# Patient Record
Sex: Female | Born: 1955 | Race: White | Hispanic: No | Marital: Married | Smoking: Former smoker
Health system: Southern US, Community
[De-identification: ages and names within clinical notes are randomized; demographics above are authoritative.]

## PROBLEM LIST (undated history)

## (undated) DIAGNOSIS — T8859XA Other complications of anesthesia, initial encounter: Secondary | ICD-10-CM

## (undated) DIAGNOSIS — M25569 Pain in unspecified knee: Secondary | ICD-10-CM

## (undated) DIAGNOSIS — K219 Gastro-esophageal reflux disease without esophagitis: Secondary | ICD-10-CM

## (undated) DIAGNOSIS — D649 Anemia, unspecified: Secondary | ICD-10-CM

## (undated) DIAGNOSIS — D509 Iron deficiency anemia, unspecified: Secondary | ICD-10-CM

## (undated) DIAGNOSIS — T4145XA Adverse effect of unspecified anesthetic, initial encounter: Secondary | ICD-10-CM

## (undated) DIAGNOSIS — M1990 Unspecified osteoarthritis, unspecified site: Secondary | ICD-10-CM

## (undated) DIAGNOSIS — I890 Lymphedema, not elsewhere classified: Secondary | ICD-10-CM

## (undated) HISTORY — PX: APPENDECTOMY: SHX54

## (undated) HISTORY — DX: Anemia, unspecified: D64.9

## (undated) HISTORY — DX: Pain in unspecified knee: M25.569

## (undated) HISTORY — DX: Iron deficiency anemia, unspecified: D50.9

## (undated) HISTORY — PX: TUBAL LIGATION: SHX77

## (undated) HISTORY — DX: Gastro-esophageal reflux disease without esophagitis: K21.9

## (undated) HISTORY — PX: OTHER SURGICAL HISTORY: SHX169

## (undated) HISTORY — PX: ABDOMINAL HYSTERECTOMY: SHX81

---

## 2001-05-25 ENCOUNTER — Emergency Department (HOSPITAL_COMMUNITY): Admission: EM | Admit: 2001-05-25 | Discharge: 2001-05-25 | Payer: Self-pay | Admitting: Emergency Medicine

## 2004-01-17 ENCOUNTER — Ambulatory Visit (HOSPITAL_COMMUNITY): Admission: RE | Admit: 2004-01-17 | Discharge: 2004-01-17 | Payer: Self-pay | Admitting: Family Medicine

## 2008-07-17 ENCOUNTER — Ambulatory Visit (HOSPITAL_COMMUNITY): Admission: RE | Admit: 2008-07-17 | Discharge: 2008-07-17 | Payer: Self-pay | Admitting: Family Medicine

## 2008-07-25 ENCOUNTER — Ambulatory Visit: Payer: Self-pay | Admitting: Orthopedic Surgery

## 2008-07-25 DIAGNOSIS — M1710 Unilateral primary osteoarthritis, unspecified knee: Secondary | ICD-10-CM

## 2008-07-25 DIAGNOSIS — IMO0002 Reserved for concepts with insufficient information to code with codable children: Secondary | ICD-10-CM | POA: Insufficient documentation

## 2008-07-25 DIAGNOSIS — M25569 Pain in unspecified knee: Secondary | ICD-10-CM

## 2008-08-01 ENCOUNTER — Telehealth: Payer: Self-pay | Admitting: Orthopedic Surgery

## 2008-08-14 ENCOUNTER — Telehealth: Payer: Self-pay | Admitting: Orthopedic Surgery

## 2008-08-26 ENCOUNTER — Ambulatory Visit: Payer: Self-pay | Admitting: Orthopedic Surgery

## 2008-08-26 DIAGNOSIS — M23302 Other meniscus derangements, unspecified lateral meniscus, unspecified knee: Secondary | ICD-10-CM

## 2008-08-29 ENCOUNTER — Telehealth: Payer: Self-pay | Admitting: Orthopedic Surgery

## 2008-09-03 ENCOUNTER — Telehealth: Payer: Self-pay | Admitting: Orthopedic Surgery

## 2008-10-07 ENCOUNTER — Ambulatory Visit: Payer: Self-pay | Admitting: Orthopedic Surgery

## 2008-10-10 ENCOUNTER — Encounter: Payer: Self-pay | Admitting: Orthopedic Surgery

## 2009-08-16 HISTORY — PX: KNEE SURGERY: SHX244

## 2010-07-10 ENCOUNTER — Encounter
Admission: RE | Admit: 2010-07-10 | Discharge: 2010-07-10 | Payer: Self-pay | Source: Home / Self Care | Admitting: Orthopedic Surgery

## 2010-08-12 ENCOUNTER — Encounter (HOSPITAL_COMMUNITY)
Admission: RE | Admit: 2010-08-12 | Discharge: 2010-09-11 | Payer: Self-pay | Source: Home / Self Care | Attending: Orthopedic Surgery | Admitting: Orthopedic Surgery

## 2010-12-08 ENCOUNTER — Ambulatory Visit (HOSPITAL_COMMUNITY)
Admission: RE | Admit: 2010-12-08 | Discharge: 2010-12-08 | Disposition: A | Discharge status: Home / Self Care | Payer: No Typology Code available for payment source | Source: Ambulatory Visit | Attending: Rheumatology | Admitting: Rheumatology

## 2010-12-08 ENCOUNTER — Other Ambulatory Visit (HOSPITAL_COMMUNITY): Payer: Self-pay | Admitting: Rheumatology

## 2010-12-08 DIAGNOSIS — Z111 Encounter for screening for respiratory tuberculosis: Secondary | ICD-10-CM

## 2010-12-08 DIAGNOSIS — Z0189 Encounter for other specified special examinations: Secondary | ICD-10-CM | POA: Insufficient documentation

## 2011-05-05 ENCOUNTER — Other Ambulatory Visit (HOSPITAL_COMMUNITY): Payer: Self-pay | Admitting: Rheumatology

## 2011-05-05 DIAGNOSIS — M25569 Pain in unspecified knee: Secondary | ICD-10-CM

## 2011-05-10 ENCOUNTER — Ambulatory Visit (HOSPITAL_COMMUNITY)
Admission: RE | Admit: 2011-05-10 | Discharge: 2011-05-10 | Disposition: A | Discharge status: Home / Self Care | Payer: No Typology Code available for payment source | Source: Ambulatory Visit | Attending: Rheumatology | Admitting: Rheumatology

## 2011-05-10 DIAGNOSIS — M25569 Pain in unspecified knee: Secondary | ICD-10-CM | POA: Insufficient documentation

## 2011-11-19 DIAGNOSIS — M1711 Unilateral primary osteoarthritis, right knee: Secondary | ICD-10-CM | POA: Insufficient documentation

## 2011-11-19 DIAGNOSIS — R600 Localized edema: Secondary | ICD-10-CM | POA: Insufficient documentation

## 2011-11-23 ENCOUNTER — Telehealth: Payer: Self-pay

## 2011-11-23 NOTE — Telephone Encounter (Signed)
Pt left VM that she has some questions in reference to scheduling colonoscopy. She will be gone this afternoon and she will call back tomorrow morning.

## 2011-11-24 NOTE — Telephone Encounter (Signed)
Pt has had constipation and some rectal bleeding. OV scheduled with Gerrit Halls, NP on 11/25/2011 @ 2:00 PM.

## 2011-11-25 ENCOUNTER — Encounter: Payer: Self-pay | Admitting: Gastroenterology

## 2011-11-25 ENCOUNTER — Ambulatory Visit (INDEPENDENT_AMBULATORY_CARE_PROVIDER_SITE_OTHER): Payer: No Typology Code available for payment source | Admitting: Gastroenterology

## 2011-11-25 DIAGNOSIS — K5900 Constipation, unspecified: Secondary | ICD-10-CM

## 2011-11-25 DIAGNOSIS — K625 Hemorrhage of anus and rectum: Secondary | ICD-10-CM

## 2011-11-25 MED ORDER — PEG-KCL-NACL-NASULF-NA ASC-C 100 G PO SOLR: 1.0000 | Freq: Once | ORAL | Status: DC

## 2011-11-25 MED ORDER — LINACLOTIDE 145 MCG PO CAPS: 1.0000 | ORAL_CAPSULE | Freq: Every day | ORAL | Status: DC

## 2011-11-25 NOTE — Patient Instructions (Signed)
Start taking Linzess 145 mcg each morning, 30 minutes before breakfast. This is for constipation.  We have set you up for a colonoscopy with Dr. Jena Gauss in the very near future.  Further recommendations to follow once this is completed.  Please call with any questions!

## 2011-11-25 NOTE — Progress Notes (Signed)
Referring Provider: Kirk Ruths, MD Primary Care Physician:  Kirk Ruths, MD, MD Primary Gastroenterologist:  Dr. Jena Gauss   Chief Complaint  Patient presents with  . Colonoscopy  . Rectal Bleeding  . Constipation    HPI:   Kathy Dawson is a pleasant 56 year old female who presents today for initial screening colonoscopy. She is actually the daughter of Kathy Dawson, a previous patient of ours who recently passed away. She reports chronic constipation, will go up to 3-4 days without a BM unless she takes something. Takes Senna at times. No abdominal pain, notes intermittent rectal bleeding, worse if constipated.   Intermittent reflux, rare. On Prilosec.   Past Medical History  Diagnosis Date  . GERD (gastroesophageal reflux disease)   . Knee pain     Past Surgical History  Procedure Date  . Tubal ligation   . Appendectomy   . Knee surgery   . Bilateral foot surgery     Current Outpatient Prescriptions  Medication Sig Dispense Refill  . betamethasone dipropionate (DIPROLENE) 0.05 % cream Apply topically 2 (two) times daily.      . calcium carbonate (TUMS - DOSED IN MG ELEMENTAL CALCIUM) 500 MG chewable tablet Chew 1 tablet by mouth daily.      Marland Kitchen glycerin adult (GLYCERIN ADULT) 2 G SUPP Place 1 suppository rectally once.      Marland Kitchen HYDROcodone-acetaminophen (NORCO) 10-325 MG per tablet Take 1 tablet by mouth every 6 (six) hours as needed.      . naproxen sodium (ANAPROX) 220 MG tablet Take 220 mg by mouth 2 (two) times daily with a meal.      . omeprazole (PRILOSEC) 20 MG capsule Take 20 mg by mouth daily.      Marland Kitchen senna (SENOKOT) 8.6 MG TABS Take 1 tablet by mouth.      . Linaclotide 145 MCG CAPS Take 1 capsule by mouth daily. Take 30 minutes before the first meal of the day.  30 capsule  1  . peg 3350 powder (MOVIPREP) SOLR Take 1 kit (100 g total) by mouth once. As directed Please purchase 1 Fleets enema to use with the prep  1 kit  0    Allergies as of  11/25/2011 - Review Complete 11/25/2011  Allergen Reaction Noted  . Prochlorperazine edisylate    . Propoxacet-n      Family History  Problem Relation Age of Onset  . Colon polyps Father     History   Social History  . Marital Status: Married    Spouse Name: N/A    Number of Children: N/A  . Years of Education: N/A   Occupational History  . Not on file.   Social History Main Topics  . Smoking status: Former Smoker -- 0.5 packs/day    Types: Cigarettes  . Smokeless tobacco: Former Neurosurgeon    Quit date: 08/26/2009  . Alcohol Use: No  . Drug Use: No  . Sexually Active: Not on file   Other Topics Concern  . Not on file   Social History Narrative  . No narrative on file    Review of Systems: Gen: Denies any fever, chills, loss of appetite, fatigue, weight loss. CV: Denies chest pain, heart palpitations, syncope, peripheral edema. Resp: Denies shortness of breath with rest, cough, wheezing GI: Denies dysphagia or odynophagia. Denies hematemesis, fecal incontinence, or jaundice.  GU : Denies urinary burning, urinary frequency, urinary incontinence.  MS: Denies joint pain, muscle weakness, cramps, limited movement Derm: Denies rash, itching, dry  skin Psych: Denies depression, anxiety, confusion or memory loss  Heme: Denies bruising, bleeding, and enlarged lymph nodes.  Physical Exam: BP 129/77  Pulse 87  Temp(Src) 99 F (37.2 C) (Temporal)  Ht 5\' 6"  (1.676 m)  Wt 199 lb 6.4 oz (90.447 kg)  BMI 32.18 kg/m2 General:   Alert and oriented. Well-developed, well-nourished, pleasant and cooperative. Head:  Normocephalic and atraumatic. Eyes:  Conjunctiva pink, sclera clear, no icterus.    Ears:  Normal auditory acuity. Nose:  No deformity, discharge,  or lesions. Mouth:  No deformity or lesions, mucosa pink and moist.  Neck:  Supple, without mass or thyromegaly. Lungs:  Clear to auscultation bilaterally, without wheezing, rales, or rhonchi.  Heart:  S1, S2 present  without murmurs noted.  Abdomen:  +BS, soft, non-tender and non-distended. Without mass or HSM. No rebound or guarding. No hernias noted. Rectal:  Deferred  Msk:  Symmetrical without gross deformities. Normal posture. Extremities:  Trace edema bilateral lower extremities, RLE limited ROM secondary to chronic right knee pain, needs replacement Neurologic:  Alert and  oriented x4;  grossly normal neurologically. Skin:  Intact, warm and dry without significant lesions or rashes Cervical Nodes:  No significant cervical adenopathy. Psych:  Alert and cooperative. Normal mood and affect.

## 2011-11-27 DIAGNOSIS — K625 Hemorrhage of anus and rectum: Secondary | ICD-10-CM | POA: Insufficient documentation

## 2011-11-27 DIAGNOSIS — K5900 Constipation, unspecified: Secondary | ICD-10-CM | POA: Insufficient documentation

## 2011-11-27 NOTE — Assessment & Plan Note (Signed)
In the setting of chronic constipation. Needs initial average risk screening TCS. Likely benign source.

## 2011-11-27 NOTE — Assessment & Plan Note (Signed)
56 year old female with need for initial screening colonoscopy. Chronic constipation noted, intermittent rectal bleeding. No abdominal pain, N/V, wt loss, lack of appetite. Likely rectal bleeding benign anorectal source. No FH of colon cancer. Father recently passed away Su Monks, patient of ours) from stomach cancer.   Proceed with TCS with Dr. Jena Gauss in near future: the risks, benefits, and alternatives have been discussed with the patient in detail. The patient states understanding and desires to proceed. Trial of Linzess 145 mcg daily, samples provided

## 2011-11-29 NOTE — Progress Notes (Signed)
Faxed to PCP

## 2011-12-01 ENCOUNTER — Encounter: Payer: Self-pay | Admitting: Gastroenterology

## 2011-12-01 ENCOUNTER — Emergency Department (HOSPITAL_COMMUNITY)
Admission: EM | Admit: 2011-12-01 | Discharge: 2011-12-02 | Disposition: A | Discharge status: Home / Self Care | Payer: No Typology Code available for payment source | Attending: Emergency Medicine | Admitting: Emergency Medicine

## 2011-12-01 ENCOUNTER — Encounter (HOSPITAL_COMMUNITY): Payer: Self-pay | Admitting: *Deleted

## 2011-12-01 DIAGNOSIS — M25569 Pain in unspecified knee: Secondary | ICD-10-CM | POA: Insufficient documentation

## 2011-12-01 DIAGNOSIS — M79609 Pain in unspecified limb: Secondary | ICD-10-CM | POA: Insufficient documentation

## 2011-12-01 DIAGNOSIS — K219 Gastro-esophageal reflux disease without esophagitis: Secondary | ICD-10-CM | POA: Insufficient documentation

## 2011-12-01 DIAGNOSIS — M79606 Pain in leg, unspecified: Secondary | ICD-10-CM

## 2011-12-01 DIAGNOSIS — M7989 Other specified soft tissue disorders: Secondary | ICD-10-CM | POA: Insufficient documentation

## 2011-12-01 NOTE — ED Notes (Signed)
Pt denies any sob, cp and resp distress

## 2011-12-01 NOTE — ED Notes (Signed)
Pt reports increased pain behind left knee cap starting earlier today, pt concerned about possible blood clot, pt denies any hx of blood clots, pt has had surgery on rt knee 1 yr ago

## 2011-12-02 ENCOUNTER — Ambulatory Visit (HOSPITAL_COMMUNITY)
Admit: 2011-12-02 | Discharge: 2011-12-02 | Disposition: A | Discharge status: Home / Self Care | Payer: No Typology Code available for payment source | Source: Ambulatory Visit | Attending: Emergency Medicine | Admitting: Emergency Medicine

## 2011-12-02 DIAGNOSIS — M79609 Pain in unspecified limb: Secondary | ICD-10-CM | POA: Insufficient documentation

## 2011-12-02 DIAGNOSIS — M7989 Other specified soft tissue disorders: Secondary | ICD-10-CM | POA: Insufficient documentation

## 2011-12-02 DIAGNOSIS — M7120 Synovial cyst of popliteal space [Baker], unspecified knee: Secondary | ICD-10-CM | POA: Insufficient documentation

## 2011-12-02 LAB — CBC
Platelets: 260 10*3/uL (ref 150–400)
RBC: 4.21 MIL/uL (ref 3.87–5.11)
WBC: 6.3 10*3/uL (ref 4.0–10.5)

## 2011-12-02 LAB — PROTIME-INR
INR: 1.1 (ref 0.00–1.49)
Prothrombin Time: 14.4 seconds (ref 11.6–15.2)

## 2011-12-02 LAB — D-DIMER, QUANTITATIVE: D-Dimer, Quant: 2.76 ug/mL-FEU — ABNORMAL HIGH (ref 0.00–0.48)

## 2011-12-02 LAB — APTT: aPTT: 47 seconds — ABNORMAL HIGH (ref 24–37)

## 2011-12-02 LAB — BASIC METABOLIC PANEL
Calcium: 9.7 mg/dL (ref 8.4–10.5)
GFR calc Af Amer: >90 mL/min (ref >90)
GFR calc non Af Amer: >90 mL/min (ref >90)
Sodium: 138 mEq/L (ref 135–145)

## 2011-12-02 MED ORDER — ENOXAPARIN SODIUM 100 MG/ML ~~LOC~~ SOLN
SUBCUTANEOUS | Status: AC
  Filled 2011-12-02: qty 1

## 2011-12-02 MED ORDER — ENOXAPARIN SODIUM 60 MG/0.6ML ~~LOC~~ SOLN
1.0000 mg/kg | Freq: Once | SUBCUTANEOUS | Status: AC
  Administered 2011-12-02: 90 mg via SUBCUTANEOUS

## 2011-12-02 NOTE — ED Provider Notes (Signed)
History     CSN: [id number]  Arrival date & time 12/01/11  2135   First MD Initiated Contact with Patient 12/02/11 0015      Chief Complaint  Patient presents with  . Knee Pain    (Consider location/radiation/quality/duration/timing/severity/associated sxs/prior treatment) HPI Comments: 56 year old female with a history of right lower extremity surgery and chronic pain. Who presents with left lower extremity swelling and pain behind the left knee and in the left calf. This is gradual in onset 2 days ago, persistent, gradually getting worse and associated with mild swelling of the left lower extremity. She has no other risk factors for DVT, no trauma, no injury, no immobilization, travel, no surgery, no fever, no shortness of breath or chest pain, no travel, no smoking, no estrogen use.  Patient is a 56 y.o. female presenting with knee pain. The history is provided by the patient and a relative.  Knee Pain    Past Medical History  Diagnosis Date  . GERD (gastroesophageal reflux disease)   . Knee pain     Past Surgical History  Procedure Date  . Tubal ligation   . Appendectomy   . Knee surgery   . Bilateral foot surgery     Family History  Problem Relation Age of Onset  . Colon polyps Father     History  Substance Use Topics  . Smoking status: Former Smoker -- 0.5 packs/day    Types: Cigarettes  . Smokeless tobacco: Former Neurosurgeon    Quit date: 08/26/2009  . Alcohol Use: No    OB History    Grav Para Term Preterm Abortions TAB SAB Ect Mult Living                  Review of Systems  All other systems reviewed and are negative.    Allergies  Prochlorperazine edisylate and Propoxacet-n  Home Medications   Current Outpatient Rx  Name Route Sig Dispense Refill  . BETAMETHASONE DIPROPIONATE 0.05 % EX CREA Topical Apply topically 2 (two) times daily.    Marland Kitchen CALCIUM CARBONATE ANTACID 500 MG PO CHEW Oral Chew 1 tablet by mouth daily.    Marland Kitchen GLYCERIN (LAXATIVE) 2 G  RE SUPP Rectal Place 1 suppository rectally once.    Marland Kitchen HYDROCODONE-ACETAMINOPHEN 10-325 MG PO TABS Oral Take 1 tablet by mouth every 6 (six) hours as needed.    Marland Kitchen LINACLOTIDE 145 MCG PO CAPS Oral Take 1 capsule by mouth daily. Take 30 minutes before the first meal of the day. 30 capsule 1  . NAPROXEN SODIUM 220 MG PO TABS Oral Take 220 mg by mouth 2 (two) times daily with a meal.    . OMEPRAZOLE 20 MG PO CPDR Oral Take 20 mg by mouth daily.    Marland Kitchen PEG-KCL-NACL-NASULF-NA ASC-C 100 G PO SOLR Oral Take 1 kit (100 g total) by mouth once. As directed Please purchase 1 Fleets enema to use with the prep 1 kit 0  . SENNA 8.6 MG PO TABS Oral Take 1 tablet by mouth.      BP 126/72  Pulse 84  Temp(Src) 98.9 F (37.2 C) (Oral)  Resp 16  Ht 5\' 5"  (1.651 m)  Wt 199 lb (90.266 kg)  BMI 33.12 kg/m2  SpO2 100%  Physical Exam  Nursing note and vitals reviewed. Constitutional: She appears well-developed and well-nourished. No distress.  HENT:  Head: Normocephalic and atraumatic.  Mouth/Throat: Oropharynx is clear and moist. No oropharyngeal exudate.  Eyes: Conjunctivae and EOM are normal. Pupils  are equal, round, and reactive to light. Right eye exhibits no discharge. Left eye exhibits no discharge. No scleral icterus.  Neck: Normal range of motion. Neck supple. No JVD present. No thyromegaly present.  Cardiovascular: Normal rate, regular rhythm, normal heart sounds and intact distal pulses.  Exam reveals no gallop and no friction rub.   No murmur heard. Pulmonary/Chest: Effort normal and breath sounds normal. No respiratory distress. She has no wheezes. She has no rales.  Abdominal: Soft. Bowel sounds are normal. She exhibits no distension and no mass. There is no tenderness.  Musculoskeletal: Normal range of motion. She exhibits tenderness. She exhibits no edema.       There is no asymmetry or edema of the lower extremities, slight decreased range of motion with flexion of the left knee, no pitting  edema, positive for tenderness behind the left knee and the proximal left calf.  Lymphadenopathy:    She has no cervical adenopathy.  Neurological: She is alert. Coordination normal.  Skin: Skin is warm and dry. No rash noted. No erythema.  Psychiatric: She has a normal mood and affect. Her behavior is normal.    ED Course  Procedures (including critical care time)  Labs Reviewed  CBC - Abnormal; Notable for the following:    Hemoglobin 11.3 (*)    HCT 34.8 (*)    All other components within normal limits  APTT - Abnormal; Notable for the following:    aPTT 47 (*)    All other components within normal limits  D-DIMER, QUANTITATIVE - Abnormal; Notable for the following:    D-Dimer, Quant 2.76 (*)    All other components within normal limits  PROTIME-INR  BASIC METABOLIC PANEL   No results found.   1. Lower extremity pain       MDM  Heart and lungs appear normal, no hypoxia tachycardia or hypotension. She has pain in her left lower extremity with subjective swelling which could be consistent with a DVT. She is low risk otherwise, d-dimer ordered, reevaluate, possible ultrasound in the morning pending d-dimer.  D/w pt and she will fu in AM.  Dimer high, other labs wnl      Vida Roller, MD 12/02/11 [id number]

## 2011-12-02 NOTE — Progress Notes (Signed)
Patient returned today for [place] of left lower leg to r/o DVT. Patient had received lovenox last night.  Reviewed [place] report with patient. She has a baker's cyst. Patient will follow up with her daughter. US Venous Img Lower Unilateral Left  12/02/2011  *RADIOLOGY REPORT*  Clinical Data: Left leg pain, swelling  LEFT LOWER EXTREMITY VENOUS DUPLEX ULTRASOUND  Technique:  Gray-scale sonography with graded compression, as well as color Doppler and duplex ultrasound were performed to evaluate the deep venous system of the lower extremity from the level of the common femoral vein through the popliteal and proximal calf veins. Spectral Doppler was utilized to evaluate flow at rest and with distal augmentation maneuvers.  Comparison:  None.  Findings:  Normal compressibility of the common femoral, superficial femoral, and popliteal veins is demonstrated, as well as the visualized proximal calf veins.  No filling defects to suggest DVT on grayscale or color Doppler imaging.  Doppler waveforms show normal direction of venous flow, normal respiratory phasicity and response to augmentation.  Popliteal fossa region demonstrates a minimally complex elongated cystic area measuring 3.4 x 1.0 x 1.1 cm compatible with a Baker's cyst.  IMPRESSION: No evidence of lower extremity deep vein thrombosis.  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 TREVOR SHICK, M.D.

## 2011-12-02 NOTE — Discharge Instructions (Signed)
Your blood testing showed that your blood clot test was slightly elevated. This does not mean that you have a blood clot but does mean that you need to followup in the morning for an ultrasound. Return to the hospital immediately for severe or worsening symptoms specifically chest pain or shortness of breath. We have already given you the first dose of your treatment for a possible blood clot. The doctor who was staffing the emergency department in the morning alleging to your results after the ultrasound is completed.

## 2011-12-07 ENCOUNTER — Other Ambulatory Visit (HOSPITAL_COMMUNITY): Payer: Self-pay | Admitting: Obstetrics & Gynecology

## 2011-12-07 ENCOUNTER — Encounter (HOSPITAL_COMMUNITY): Payer: Self-pay | Admitting: Pharmacy Technician

## 2011-12-07 DIAGNOSIS — Z139 Encounter for screening, unspecified: Secondary | ICD-10-CM

## 2011-12-07 MED ORDER — SODIUM CHLORIDE 0.45 % IV SOLN
Freq: Once | INTRAVENOUS | Status: AC
  Administered 2011-12-08: 12:00:00 via INTRAVENOUS

## 2011-12-08 ENCOUNTER — Encounter (HOSPITAL_COMMUNITY): Payer: Self-pay | Admitting: *Deleted

## 2011-12-08 ENCOUNTER — Encounter (HOSPITAL_COMMUNITY)
Admission: RE | Disposition: A | Discharge status: Home / Self Care | Payer: Self-pay | Source: Ambulatory Visit | Attending: Internal Medicine

## 2011-12-08 ENCOUNTER — Ambulatory Visit (HOSPITAL_COMMUNITY)
Admission: RE | Admit: 2011-12-08 | Discharge: 2011-12-08 | Disposition: A | Discharge status: Home / Self Care | Payer: No Typology Code available for payment source | Source: Ambulatory Visit | Attending: Internal Medicine | Admitting: Internal Medicine

## 2011-12-08 DIAGNOSIS — K648 Other hemorrhoids: Secondary | ICD-10-CM

## 2011-12-08 DIAGNOSIS — Z83719 Family history of colon polyps, unspecified: Secondary | ICD-10-CM | POA: Insufficient documentation

## 2011-12-08 DIAGNOSIS — K625 Hemorrhage of anus and rectum: Secondary | ICD-10-CM

## 2011-12-08 DIAGNOSIS — D126 Benign neoplasm of colon, unspecified: Secondary | ICD-10-CM | POA: Insufficient documentation

## 2011-12-08 DIAGNOSIS — Z8371 Family history of colonic polyps: Secondary | ICD-10-CM | POA: Insufficient documentation

## 2011-12-08 DIAGNOSIS — K5730 Diverticulosis of large intestine without perforation or abscess without bleeding: Secondary | ICD-10-CM | POA: Insufficient documentation

## 2011-12-08 DIAGNOSIS — Z1211 Encounter for screening for malignant neoplasm of colon: Secondary | ICD-10-CM | POA: Insufficient documentation

## 2011-12-08 DIAGNOSIS — K921 Melena: Secondary | ICD-10-CM

## 2011-12-08 HISTORY — PX: COLONOSCOPY: SHX5424

## 2011-12-08 SURGERY — COLONOSCOPY
Anesthesia: Moderate Sedation

## 2011-12-08 MED ORDER — MEPERIDINE HCL 100 MG/ML IJ SOLN
INTRAMUSCULAR | Status: AC
  Filled 2011-12-08: qty 2

## 2011-12-08 MED ORDER — MIDAZOLAM HCL 5 MG/5ML IJ SOLN
INTRAMUSCULAR | Status: AC
  Filled 2011-12-08: qty 10

## 2011-12-08 MED ORDER — MEPERIDINE HCL 100 MG/ML IJ SOLN
INTRAMUSCULAR | Status: DC | PRN
  Administered 2011-12-08 (×2): 50 mg via INTRAVENOUS

## 2011-12-08 MED ORDER — MIDAZOLAM HCL 5 MG/5ML IJ SOLN
INTRAMUSCULAR | Status: DC | PRN
  Administered 2011-12-08: 2 mg via INTRAVENOUS
  Administered 2011-12-08: 1 mg via INTRAVENOUS
  Administered 2011-12-08: 2 mg via INTRAVENOUS
  Administered 2011-12-08: 1 mg via INTRAVENOUS

## 2011-12-08 MED ORDER — STERILE WATER FOR IRRIGATION IR SOLN
Status: DC | PRN
  Administered 2011-12-08: 13:00:00

## 2011-12-08 MED ORDER — HYDROCORTISONE ACETATE 25 MG RE SUPP: 25.0000 mg | Freq: Two times a day (BID) | RECTAL | Status: AC

## 2011-12-08 NOTE — Op Note (Signed)
[facility] [address] [place], [place]  [address]  COLONOSCOPY PROCEDURE REPORT  PATIENT:  Kathy, Dawson  MR#:  [id number] BIRTHDATE:  [birth year], 55 yrs. old  GENDER:  female ENDOSCOPIST:  R. Roetta Sessions, MD FACP [facility] REF. BY:  Karleen Hampshire, M.D. PROCEDURE DATE:  12/08/2011 PROCEDURE:  Colonoscopy with snare polypectomy  INDICATIONS:  Hematochezia/first ever colonoscopy  INFORMED CONSENT:  The risks, benefits, alternatives and imponderables including but not limited to bleeding, perforation as well as the possibility of a missed lesion have been reviewed. The potential for biopsy, lesion removal, etc. have also been discussed.  Questions have been answered.  All parties agreeable. Please see the history and physical in the medical record for more information.  MEDICATIONS:  Demerol 100 mg IV Versed 6 mg IV in divided doses.  DESCRIPTION OF PROCEDURE:  After a digital rectal exam was performed, the EC-[id number] ([id number]) colonoscope was advanced from the anus through the rectum and colon to the area of the cecum, ileocecal valve and appendiceal orifice.  The cecum was deeply intubated.  These structures were well-seen and photographed for the record.  From the level of the cecum and ileocecal valve, the scope was slowly and cautiously withdrawn.  The mucosal surfaces were carefully surveyed utilizing scope tip deflection to facilitate fold flattening as needed.  The scope was pulled down into the rectum where a thorough examination including retroflexion was performed. <<PROCEDUREIMAGES>>  FINDINGS: Adequate preparation. Internal hemorrhoids and anal papilla; otherwise, normal rectum. Sigmoid diverticula; single 4 mm polyp in the mid descending segment; otherwise, normal appearing colonic mucosa except for a 1.5 cm pale yellow submucosal nodule just distal to the ileocecal valve consistent with a lipoma (positive pillow sign) THERAPEUTIC / DIAGNOSTIC  MANEUVERS PERFORMED: The descending polyp was cold snare removed.  COMPLICATIONS: None  CECAL WITHDRAWAL TIME: 9 minutes  IMPRESSION: Internal hemorrhoids-likely source of hematochezia. Sigmoid diverticulosis. Colonic polyp - removed as described above. Colonic                 lipoma.  RECOMMENDATIONS: Course of Anusol suppositories. Continue Linzess; Benefiber 2 tablespoons daily. Further recommendations to follow pending review of pathology.  ______________________________ R. Roetta Sessions, MD [name]  CC:  Karleen Hampshire, M.D.  n. eSIGNED:   R. Roetta Sessions at 12/08/2011 01:20 PM  [name], [phone]

## 2011-12-08 NOTE — Interval H&P Note (Signed)
History and Physical Interval Note:  12/08/2011 12:43 PM  Kathy Dawson  has presented today for surgery, with the diagnosis of RECTAL BLEEDING  The various methods of treatment have been discussed with the patient and family. After consideration of risks, benefits and other options for treatment, the patient has consented to  Procedure(s) (LRB): COLONOSCOPY (N/A) as a surgical intervention .  The patients' history has been reviewed, patient examined, no change in status, stable for surgery.  I have reviewed the patients' chart and labs.  Questions were answered to the patient's satisfaction.     [name]

## 2011-12-08 NOTE — H&P (View-Only) (Signed)
 Referring Provider: McGough, William M, MD Primary Care Physician:  MCGOUGH,WILLIAM M, MD, MD Primary Gastroenterologist:  Dr. Rourk   Chief Complaint  Patient presents with  . Colonoscopy  . Rectal Bleeding  . Constipation    HPI:   Kathy Dawson is a pleasant 56-year-old female who presents today for initial screening colonoscopy. She is actually the daughter of Herman Williamson, a previous patient of ours who recently passed away. She reports chronic constipation, will go up to 3-4 days without a BM unless she takes something. Takes Senna at times. No abdominal pain, notes intermittent rectal bleeding, worse if constipated.   Intermittent reflux, rare. On Prilosec.   Past Medical History  Diagnosis Date  . GERD (gastroesophageal reflux disease)   . Knee pain     Past Surgical History  Procedure Date  . Tubal ligation   . Appendectomy   . Knee surgery   . Bilateral foot surgery     Current Outpatient Prescriptions  Medication Sig Dispense Refill  . betamethasone dipropionate (DIPROLENE) 0.05 % cream Apply topically 2 (two) times daily.      . calcium carbonate (TUMS - DOSED IN MG ELEMENTAL CALCIUM) 500 MG chewable tablet Chew 1 tablet by mouth daily.      . glycerin adult (GLYCERIN ADULT) 2 G SUPP Place 1 suppository rectally once.      . HYDROcodone-acetaminophen (NORCO) 10-325 MG per tablet Take 1 tablet by mouth every 6 (six) hours as needed.      . naproxen sodium (ANAPROX) 220 MG tablet Take 220 mg by mouth 2 (two) times daily with a meal.      . omeprazole (PRILOSEC) 20 MG capsule Take 20 mg by mouth daily.      . senna (SENOKOT) 8.6 MG TABS Take 1 tablet by mouth.      . Linaclotide 145 MCG CAPS Take 1 capsule by mouth daily. Take 30 minutes before the first meal of the day.  30 capsule  1  . peg 3350 powder (MOVIPREP) SOLR Take 1 kit (100 g total) by mouth once. As directed Please purchase 1 Fleets enema to use with the prep  1 kit  0    Allergies as of  11/25/2011 - Review Complete 11/25/2011  Allergen Reaction Noted  . Prochlorperazine edisylate    . Propoxacet-n      Family History  Problem Relation Age of Onset  . Colon polyps Father     History   Social History  . Marital Status: Married    Spouse Name: N/A    Number of Children: N/A  . Years of Education: N/A   Occupational History  . Not on file.   Social History Main Topics  . Smoking status: Former Smoker -- 0.5 packs/day    Types: Cigarettes  . Smokeless tobacco: Former User    Quit date: 08/26/2009  . Alcohol Use: No  . Drug Use: No  . Sexually Active: Not on file   Other Topics Concern  . Not on file   Social History Narrative  . No narrative on file    Review of Systems: Gen: Denies any fever, chills, loss of appetite, fatigue, weight loss. CV: Denies chest pain, heart palpitations, syncope, peripheral edema. Resp: Denies shortness of breath with rest, cough, wheezing GI: Denies dysphagia or odynophagia. Denies hematemesis, fecal incontinence, or jaundice.  GU : Denies urinary burning, urinary frequency, urinary incontinence.  MS: Denies joint pain, muscle weakness, cramps, limited movement Derm: Denies rash, itching, dry   skin Psych: Denies depression, anxiety, confusion or memory loss  Heme: Denies bruising, bleeding, and enlarged lymph nodes.  Physical Exam: BP 129/77  Pulse 87  Temp(Src) 99 F (37.2 C) (Temporal)  Ht 5' 6" (1.676 m)  Wt 199 lb 6.4 oz (90.447 kg)  BMI 32.18 kg/m2 General:   Alert and oriented. Well-developed, well-nourished, pleasant and cooperative. Head:  Normocephalic and atraumatic. Eyes:  Conjunctiva pink, sclera clear, no icterus.    Ears:  Normal auditory acuity. Nose:  No deformity, discharge,  or lesions. Mouth:  No deformity or lesions, mucosa pink and moist.  Neck:  Supple, without mass or thyromegaly. Lungs:  Clear to auscultation bilaterally, without wheezing, rales, or rhonchi.  Heart:  S1, S2 present  without murmurs noted.  Abdomen:  +BS, soft, non-tender and non-distended. Without mass or HSM. No rebound or guarding. No hernias noted. Rectal:  Deferred  Msk:  Symmetrical without gross deformities. Normal posture. Extremities:  Trace edema bilateral lower extremities, RLE limited ROM secondary to chronic right knee pain, needs replacement Neurologic:  Alert and  oriented x4;  grossly normal neurologically. Skin:  Intact, warm and dry without significant lesions or rashes Cervical Nodes:  No significant cervical adenopathy. Psych:  Alert and cooperative. Normal mood and affect.   

## 2011-12-08 NOTE — Discharge Instructions (Signed)
  Colonoscopy Discharge Instructions  Read the instructions outlined below and refer to this sheet in the next few weeks. These discharge instructions provide you with general information on caring for yourself after you leave the hospital. Your doctor may also give you specific instructions. While your treatment has been planned according to the most current medical practices available, unavoidable complications occasionally occur. If you have any problems or questions after discharge, call Dr. Jena Gauss at [phone]. ACTIVITY  You may resume your regular activity, but move at a slower pace for the next 24 hours.   Take frequent rest periods for the next 24 hours.   Walking will help get rid of the air and reduce the bloated feeling in your belly (abdomen).   No driving for 24 hours (because of the medicine (anesthesia) used during the test).    Do not sign any important legal documents or operate any machinery for 24 hours (because of the anesthesia used during the test).  NUTRITION  Drink plenty of fluids.   You may resume your normal diet as instructed by your doctor.   Begin with a light meal and progress to your normal diet. Heavy or fried foods are harder to digest and may make you feel sick to your stomach (nauseated).   Avoid alcoholic beverages for 24 hours or as instructed.  MEDICATIONS  You may resume your normal medications unless your doctor tells you otherwise.  WHAT YOU CAN EXPECT TODAY  Some feelings of bloating in the abdomen.   Passage of more gas than usual.   Spotting of blood in your stool or on the toilet paper.  IF YOU HAD POLYPS REMOVED DURING THE COLONOSCOPY:  No aspirin products for 7 days or as instructed.   No alcohol for 7 days or as instructed.   Eat a soft diet for the next 24 hours.  FINDING OUT THE RESULTS OF YOUR TEST Not all test results are available during your visit. If your test results are not back during the visit, make an appointment  with your caregiver to find out the results. Do not assume everything is normal if you have not heard from your caregiver or the medical facility. It is important for you to follow up on all of your test results.  SEEK IMMEDIATE MEDICAL ATTENTION IF:  You have more than a spotting of blood in your stool.   Your belly is swollen (abdominal distention).   You are nauseated or vomiting.   You have a temperature over 101.   You have abdominal pain or discomfort that is severe or gets worse throughout the day.     Polyp, diverticulosis and hemorrhoid information provided.  Daily fiber supplement such as Benefiber 2 tablespoons  One-week course of Anusol suppositories.  Further recommendations to follow pending review of pathology report.

## 2011-12-09 ENCOUNTER — Ambulatory Visit (HOSPITAL_COMMUNITY)
Admission: RE | Admit: 2011-12-09 | Discharge: 2011-12-09 | Disposition: A | Discharge status: Home / Self Care | Payer: No Typology Code available for payment source | Source: Ambulatory Visit | Attending: Obstetrics & Gynecology | Admitting: Obstetrics & Gynecology

## 2011-12-09 DIAGNOSIS — Z1231 Encounter for screening mammogram for malignant neoplasm of breast: Secondary | ICD-10-CM | POA: Insufficient documentation

## 2011-12-09 DIAGNOSIS — Z139 Encounter for screening, unspecified: Secondary | ICD-10-CM

## 2011-12-10 ENCOUNTER — Encounter (HOSPITAL_COMMUNITY): Payer: Self-pay | Admitting: Internal Medicine

## 2011-12-11 ENCOUNTER — Encounter: Payer: Self-pay | Admitting: Internal Medicine

## 2012-01-31 DIAGNOSIS — T8484XA Pain due to internal orthopedic prosthetic devices, implants and grafts, initial encounter: Secondary | ICD-10-CM | POA: Insufficient documentation

## 2012-03-03 ENCOUNTER — Other Ambulatory Visit: Payer: Self-pay | Admitting: Orthopaedic Surgery

## 2012-03-03 DIAGNOSIS — M25562 Pain in left knee: Secondary | ICD-10-CM

## 2012-03-13 ENCOUNTER — Ambulatory Visit
Admission: RE | Admit: 2012-03-13 | Discharge: 2012-03-13 | Disposition: A | Discharge status: Home / Self Care | Payer: No Typology Code available for payment source | Source: Ambulatory Visit | Attending: Orthopaedic Surgery | Admitting: Orthopaedic Surgery

## 2012-03-13 DIAGNOSIS — M25562 Pain in left knee: Secondary | ICD-10-CM

## 2012-06-27 ENCOUNTER — Ambulatory Visit (INDEPENDENT_AMBULATORY_CARE_PROVIDER_SITE_OTHER): Payer: No Typology Code available for payment source | Admitting: Urology

## 2012-06-27 DIAGNOSIS — R82998 Other abnormal findings in urine: Secondary | ICD-10-CM

## 2012-06-27 DIAGNOSIS — N390 Urinary tract infection, site not specified: Secondary | ICD-10-CM

## 2012-06-27 DIAGNOSIS — N8111 Cystocele, midline: Secondary | ICD-10-CM

## 2012-10-09 DIAGNOSIS — D509 Iron deficiency anemia, unspecified: Secondary | ICD-10-CM | POA: Insufficient documentation

## 2012-12-19 DIAGNOSIS — M869 Osteomyelitis, unspecified: Secondary | ICD-10-CM | POA: Insufficient documentation

## 2013-01-19 HISTORY — PX: REPLACEMENT TOTAL KNEE: SUR1224

## 2013-01-25 ENCOUNTER — Ambulatory Visit: Payer: No Typology Code available for payment source

## 2013-02-05 ENCOUNTER — Ambulatory Visit: Payer: BC Managed Care – PPO

## 2013-02-05 ENCOUNTER — Ambulatory Visit (HOSPITAL_COMMUNITY)
Admission: RE | Admit: 2013-02-05 | Discharge: 2013-02-05 | Disposition: A | Discharge status: Home / Self Care | Payer: BC Managed Care – PPO | Source: Ambulatory Visit | Attending: Orthopaedic Surgery | Admitting: Orthopaedic Surgery

## 2013-02-05 DIAGNOSIS — M25569 Pain in unspecified knee: Secondary | ICD-10-CM | POA: Insufficient documentation

## 2013-02-05 DIAGNOSIS — R262 Difficulty in walking, not elsewhere classified: Secondary | ICD-10-CM | POA: Insufficient documentation

## 2013-02-05 DIAGNOSIS — M25661 Stiffness of right knee, not elsewhere classified: Secondary | ICD-10-CM | POA: Insufficient documentation

## 2013-02-05 DIAGNOSIS — IMO0001 Reserved for inherently not codable concepts without codable children: Secondary | ICD-10-CM | POA: Insufficient documentation

## 2013-02-05 DIAGNOSIS — M25669 Stiffness of unspecified knee, not elsewhere classified: Secondary | ICD-10-CM | POA: Insufficient documentation

## 2013-02-05 NOTE — Evaluation (Signed)
Physical Therapy Evaluation  Patient Details  Name: Kathy Dawson MRN: [id number] Date of Birth: 1956-05-15  Today's Date: 02/05/2013 Time: 4742-5956 PT Time Calculation (min): 45 min Charges: 1 evaluation TE: 10 minutes             Visit#: 1 of 12  Re-eval: 03/07/13 Assessment Diagnosis: Rt TKR Surgical Date: 01/15/13 Next MD Visit: Dr. Lamar Sprinkles: July 18th  Past Medical History:  Past Medical History  Diagnosis Date  . GERD (gastroesophageal reflux disease)   . Knee pain    Past Surgical History:  Past Surgical History  Procedure Laterality Date  . Tubal ligation    . Appendectomy    . Knee surgery    . Bilateral foot surgery    . Colonoscopy  12/08/2011    Procedure: COLONOSCOPY;  Surgeon: Corbin Ade, MD;  Location: [facility];  Service: Endoscopy;  Laterality: N/A;  12:45    Subjective Symptoms/Limitations Pertinent History: Pt is referred to PT s/p Rt TKR.  She reports that the summer of 2010 she twisted her knee when handling her baby goat.  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  December 2011 had a knee scope and had increased swelling and unable to do her exercises.  F/u with her orthopedic surgeon (Dr. Sherlean Foot) In Feb 2012 had an emergency wash out of her knee.  Continued to have swelling and referred a rhematologist (Dr. Dutch Quint) for the swelling without any significant conclussions. 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  She was scheduled for a TKR last June and August and had a UTI each time.  On March 20th  scheduled her TKR and found the infection was still in her joint and received her cement implants along with IV antibiotics for 6 weeks and oral antibiotics for 1 week and was cleared for 2 weeks.  Had the cement plate removed May 31st.  and then had another antibiotic cement plate placed in from May 31-June 6th when she had the TKR. Her c/co is difficulty walking, decreased knee flexion and extension and unable to work on her farm.  Pain Assessment Currently in Pain?: Yes Pain Location: Knee Pain Orientation: Right Pain Type: Acute pain;Chronic pain;Surgical pain Pain Onset: More than a month ago  Precautions/Restrictions  Precautions Precaution Comments: no pulling on incision  Balance Screening Balance Screen Has the patient fallen in the past 6 months: Yes How many times?: 2 Has the patient had a decrease in activity level because of a fear of falling? : Yes Is the patient reluctant to leave their home because of a fear of falling? : Yes  Prior Functioning  Prior Function Able to Take Stairs?: Yes Driving: Yes Vocation: Full time employment Vocation Requirements: self employeed farmer Leisure: Hobbies-yes (Comment)  Cognition/Observation  Comes in with TED hose and incision healing well. Has a stitch she is concerned about that is sticking out.   Sensation/Coordination/Flexibility/Functional Tests Coordination Gross Motor Movements are Fluid and Coordinated: No Coordination and Movement Description: impaired to distal quadricep Functional Tests Functional  Tests: Lower Exremity Functional Scale (LEFS): 12/80  Assessment RLE AROM (degrees) Right Knee Extension: 5 Right Knee Flexion: 65 RLE Strength Right Hip Flexion: 3/5 Right Hip Extension: 3-/5 Right Hip ABduction: 3/5 Right Knee Flexion: 3/5 Right Knee Extension: 2+/5 Palpation Palpation: maximal fascial restriction to Rt knee incsions, edema to quadricpes, pain and tendernesss to distal quadriceps and  hamstrings  Mobility/Balance  Ambulation/Gait Ambulation/Gait: Yes Ambulation/Gait Assistance: 6: Modified independent (Device/Increase time) Assistive device: Rolling walker Gait Pattern: Decreased stance time - right;Decreased hip/knee flexion - right;Trunk flexed   Exercise/Treatments Seated Long Arc Quad: 10 reps Heel Slides: 10 reps;AAROM (5 sec holds) Supine Quad Sets: AROM;10 reps Heel Slides: Right;10 reps Sidelying Hip ABduction: Right;10 reps Prone  Hamstring Curl: 10 reps Hip Extension: Right;10 reps      Physical Therapy Assessment and Plan PT Assessment and Plan Clinical Impression Statement: Pt is a 57 year old female referred to PT s/p Rt TKR on 01/19/13 with impairments listed below.  Her PMH is significant for multiple surgeries to her knee including specialized surgical intervention to eliminate systemic knee joint infection.  Her knee has been opened 3 seperate times.  At this time she is most limited by  her knee AROM 5-65 degrees.  Has a stitch that is bothering her and will f/u tomorrow with the MD to discuss.  At this time do not have Dr. Lamar Sprinkles protocol, attempted to contact MD office, but were closed.  Will contact tomorrow to discuss approprriate outcomes.   Pt will benefit from skilled therapeutic intervention in order to improve on the following deficits: Abnormal gait;Decreased activity tolerance;Decreased balance;Decreased strength;Difficulty walking;Pain;Impaired flexibility;Impaired perceived functional ability PT Frequency: Min 3X/week PT Duration: 8 weeks PT Treatment/Interventions: Gait training;Stair training;Functional mobility training;Therapeutic activities;Therapeutic exercise;Balance training;Neuromuscular re-education;Patient/family education;Manual techniques PT Plan: Follow Dr. Teodoro Kil protocol. Gentle ROM activties: bike for ROM (do not force to go around), SLR, SAQ, standing mini squats, heel and toe raises, knee flexion      Goals  Home  Exercise Program  Pt will Perform Home Exercise Program: Independently  PT Goal: Perform Home Exercise Program - Progress: Goal set today  PT Short Term Goals  Time to Complete Short Term Goals: 4 weeks  PT Short Term Goal 1: Pt will improve her knee AROM 0-105 degrees for greater ease with sit to stand activities.  PT Short Term Goal 2: Pt will improve her RLE strength in order to ambulate independently in indoor environments.  PT Short Term Goal 3: Pt will present with moderate fascial restrictions to her scar.  PT Short Term Goal 4: Pt will improve her proprioceptive awareness and demonstrate Rt SLS x30 seconds on solid surface.  PT Long Term Goals  Time to Complete Long Term Goals: 8 weeks  PT Long Term Goal 1: Pt will improve balance and independently ambulate on outdoor surfaces to return to light farming duties.  PT Long Term Goal 2: Pt will improve her Rt knee AROM 0-110 degrees for greater ease with sit to stand activities.  Long Term Goal 3: Pt willi improve her RLE strength to [facility] in order to tolerate ambulating for 60 minutes on outdoor surfaces to attend community activities.  Long Term Goal 4: Pt will improve her LEFS to 60/80 for improved percieved functional ability   Problem List Patient Active Problem List   Diagnosis Date Noted  . Difficulty in walking(719.7) 02/05/2013  . Stiffness of right knee 02/05/2013  . Rectal bleeding 11/27/2011  . Constipation 11/27/2011  . DERANGEMENT  MENISCUS 08/26/2008  . KNEE, ARTHRITIS, DEGEN./OSTEO 07/25/2008  . KNEE PAIN 07/25/2008    General Behavior During Therapy: [facility] for tasks assessed/performed Cognition: [facility] for tasks performed PT Plan of Care PT Home Exercise Plan: see scanned report PT Patient Instructions: importance of HEP, answered questions about diagnosis Consulted and Agree with Plan of Care: Patient;Family member/caregiver Family Member Consulted: sister (dana)  Diona Peregoy, MPT ATC 02/05/2013, 5:54  PM  Physician Documentation Your signature is required to indicate approval of the treatment plan as stated above.  Please sign and either send electronically or make a copy of this report for your files and return this physician signed original.   Please mark one 1.__approve of plan  2. ___approve of plan with the following conditions.   ______________________________                                                          _____________________ Physician Signature                                                                                                             Date

## 2013-02-07 ENCOUNTER — Ambulatory Visit (HOSPITAL_COMMUNITY)
Admission: RE | Admit: 2013-02-07 | Discharge: 2013-02-07 | Disposition: A | Discharge status: Home / Self Care | Payer: BC Managed Care – PPO | Source: Ambulatory Visit

## 2013-02-07 DIAGNOSIS — R262 Difficulty in walking, not elsewhere classified: Secondary | ICD-10-CM

## 2013-02-07 DIAGNOSIS — M25661 Stiffness of right knee, not elsewhere classified: Secondary | ICD-10-CM

## 2013-02-07 NOTE — Progress Notes (Signed)
Physical Therapy Treatment Patient Details  Name: Kathy Dawson MRN: [id number] Date of Birth: 25-May-1956  Today's Date: 02/07/2013 Time: 1640-1720 PT Time Calculation (min): 40 min Charge TE 1640-1710, Self care 1710-1720  Visit#: 2 of 12  Re-eval: 03/07/13 Assessment Diagnosis: Rt TKR Surgical Date: 01/15/13 Next MD Visit: Dr. Lamar Sprinkles: July 18th  Subjective: Symptoms/Limitations Symptoms: Pt stated today has been the most panful day since surgery, has been compliant with HEP Pain Assessment Currently in Pain?: Yes Pain Score:   5 Pain Location: Knee Pain Orientation: Right  Precautions/Restrictions  Precautions Precaution Comments: no pulling on incision, MD protocol  Exercise/Treatments Mobility/Balance        Stretches   Aerobic Stationary Bike: 6' no resistance full revolution seat 11 Standing Heel Raises: 10 reps;Limitations Heel Raises Limitations: Toe raises 10x Knee Flexion: Right;10 reps Functional Squat: 10 reps;Limitations Functional Squat Limitations: minisquats  Seated Long Arc Quad: 10 reps Supine Quad Sets: AROM;10 reps Short Arc Quad Sets: Right;10 reps Heel Slides: Right;10 reps Straight Leg Raises: Right;10 reps  Physical Therapy Assessment and Plan PT Assessment and Plan Clinical Impression Statement: Began PT POC per MD protocol for Rt TKR ROM and strengthening.  Pt able to make full revolution on stationary bike seat 11 no resistance following rocking initially.  Pt able to complete full therex following demonstration for technique with min difficulty.  Pt does require multimodal cueing for appropriate muscle contraction with quad sets.  Pt explained benefits of ice for pain and edema control and reviewed exercises to continue completeing at home.  Pt plans to ice at home.   PT Plan: Continue with current POC per MD protocol.  Continue with gentle ROM and strengthening exercises.  Next session may need to add manual techniques to reduce edema  and fascial restrictions to IT band.    Goals    Problem List Patient Active Problem List   Diagnosis Date Noted  . Difficulty in walking(719.7) 02/05/2013  . Stiffness of right knee 02/05/2013  . Rectal bleeding 11/27/2011  . Constipation 11/27/2011  . DERANGEMENT MENISCUS 08/26/2008  . KNEE, ARTHRITIS, DEGEN./OSTEO 07/25/2008  . KNEE PAIN 07/25/2008    PT - End of Session Activity Tolerance: Patient tolerated treatment well General Behavior During Therapy: WFL for tasks assessed/performed Cognition: WFL for tasks performed  GP    Juel Burrow 02/07/2013, 5:37 PM

## 2013-02-09 ENCOUNTER — Ambulatory Visit (HOSPITAL_COMMUNITY)
Admission: RE | Admit: 2013-02-09 | Discharge: 2013-02-09 | Disposition: A | Discharge status: Home / Self Care | Payer: BC Managed Care – PPO | Source: Ambulatory Visit

## 2013-02-09 DIAGNOSIS — M25661 Stiffness of right knee, not elsewhere classified: Secondary | ICD-10-CM

## 2013-02-09 DIAGNOSIS — R262 Difficulty in walking, not elsewhere classified: Secondary | ICD-10-CM

## 2013-02-09 NOTE — Progress Notes (Signed)
Physical Therapy Treatment Patient Details  Name: Kathy Dawson MRN: [id number] Date of Birth: Aug 06, 1956  Today's Date: 02/09/2013 Time: 1710-1800 PT Time Calculation (min): 50 min Charge : TE 32'1710-1725, 1743-1800, Manual 18' 1725-1743  Visit#: 3 of 12  Re-eval: 03/07/13 Assessment Diagnosis: Rt TKR Surgical Date: 01/15/13 Next MD Visit: Dr. Lamar Sprinkles: July 18th  Subjective: Symptoms/Limitations Symptoms: Pt reported she had a change of pain medication today.  Stated pain scale 5/10 today.  Was sore following last session. Pain Assessment Currently in Pain?: Yes Pain Score:   5 Pain Location: Knee Pain Orientation: Right  Precautions/Restrictions  Precautions Precaution Comments: no pulling on incision, MD protocol  Exercise/Treatments Aerobic Stationary Bike: 8' no resistance full revolution seat 10 Standing Heel Raises: 10 reps;Limitations Heel Raises Limitations: Toe raises 10x Functional Squat: 5 reps;Limitations Functional Squat Limitations: minisquats reduced due to pain Supine Quad Sets: AROM;10 reps Short Arc Quad Sets: Right;10 reps Straight Leg Raises: Right;10 reps  Manual Therapy Manual Therapy: Edema management Edema Management: Retro massage to reduce edema 1825-1835 Myofascial Release: MFR to anterior knee to reduce adhesions; no pulling on incision 1835-1843  Physical Therapy Assessment and Plan PT Assessment and Plan Clinical Impression Statement: AROM improving, able to move from seat 11-->10 with full revolution with increased ease.  Added manual MFR to reduce fascial restrictions for reduced pan and to improve ROM. Patella mobs complete to reduce adhesions to improve patella tracking.  No increase in pain through session.   PT Plan: Continue with current POC per MD protocol.  Continue with gentle ROM and strengthening exercises.  Next session may need to add manual techniques to reduce edema and fascial restrictions to IT band.    Goals     Problem List Patient Active Problem List   Diagnosis Date Noted  . Difficulty in walking(719.7) 02/05/2013  . Stiffness of right knee 02/05/2013  . Rectal bleeding 11/27/2011  . Constipation 11/27/2011  . DERANGEMENT MENISCUS 08/26/2008  . KNEE, ARTHRITIS, DEGEN./OSTEO 07/25/2008  . KNEE PAIN 07/25/2008    PT - End of Session Activity Tolerance: Patient tolerated treatment well;Patient limited by pain General Behavior During Therapy: [facility] for tasks assessed/performed Cognition: WFL for tasks performed  GP    Juel Burrow 02/09/2013, 6:13 PM

## 2013-02-12 ENCOUNTER — Ambulatory Visit (HOSPITAL_COMMUNITY)
Admission: RE | Admit: 2013-02-12 | Discharge: 2013-02-12 | Disposition: A | Discharge status: Home / Self Care | Payer: BC Managed Care – PPO | Source: Ambulatory Visit | Attending: Family Medicine | Admitting: Family Medicine

## 2013-02-12 NOTE — Progress Notes (Signed)
Physical Therapy Treatment Patient Details  Name: Kathy Dawson MRN: [id number] Date of Birth: 09-Apr-1956  Today's Date: 02/12/2013 Time: 1300-1348 PT Time Calculation (min): 48 min Visit#: 4 of 12  Re-eval: 03/07/13 Charges:  therex 30', 13:00-13:30, manual 16' ,13:31-13:47   Subjective: Symptoms/Limitations Symptoms: Pt. states her pain was up to a 9/10 until taking pain meds and now 5/10.   Pain Assessment Currently in Pain?: Yes Pain Score:   5 Pain Location: Knee Pain Orientation: Right   Exercise/Treatments Aerobic Stationary Bike: 8' no resistance full revolution seat 10 Standing Heel Raises: 15 reps Heel Raises Limitations: Toe raises 15x Functional Squat: 5 reps;Limitations Functional Squat Limitations: minisquats reduced due to pain Supine Quad Sets: AROM;15 reps Short Arc Quad Sets: Right;15 reps Straight Leg Raises: Right;15 reps Sidelying Hip ABduction: 15 reps;Right Prone  Hamstring Curl: 15 reps Hip Extension: 15 reps   Manual Therapy Manual Therapy: Edema management Edema Management: Retro massage to reduce edema after MFR preformed Myofascial Release: MFR to anterior Rt. anterior knee to reduce adhesions, avoiding direct contact with incision  Physical Therapy Assessment and Plan PT Assessment and Plan Clinical Impression Statement: Continued improvement in AROM with 3-80 degrees today (was 5-65 degrees last week).  PROM today 0-87 degrees.  Pt encouraged to focus more on flexion at home.  Good patellar mobility today.  Myofascial techniques completed as well as retro massage for edema control.  Increased reps today without diffiuculty. PT Plan: Continue with current POC per MD protocol.  Continue with gentle ROM and strengthening exercises.  Next session may need to add manual techniques to reduce edema and fascial restrictions to IT band.     Problem List Patient Active Problem List   Diagnosis Date Noted  . Difficulty in walking(719.7)  02/05/2013  . Stiffness of right knee 02/05/2013  . Rectal bleeding 11/27/2011  . Constipation 11/27/2011  . DERANGEMENT MENISCUS 08/26/2008  . KNEE, ARTHRITIS, DEGEN./OSTEO 07/25/2008  . KNEE PAIN 07/25/2008    General Behavior During Therapy: [facility] for tasks assessed/performed Cognition: [facility] for tasks performed   Lurena Nida, PTA/CLT 02/12/2013, 2:11 PM

## 2013-02-14 ENCOUNTER — Encounter: Payer: BC Managed Care – PPO | Admitting: Physical Therapy

## 2013-02-14 ENCOUNTER — Ambulatory Visit (HOSPITAL_COMMUNITY)
Admission: RE | Admit: 2013-02-14 | Discharge: 2013-02-14 | Disposition: A | Discharge status: Home / Self Care | Payer: BC Managed Care – PPO | Source: Ambulatory Visit | Attending: Orthopaedic Surgery | Admitting: Orthopaedic Surgery

## 2013-02-14 DIAGNOSIS — IMO0001 Reserved for inherently not codable concepts without codable children: Secondary | ICD-10-CM | POA: Insufficient documentation

## 2013-02-14 DIAGNOSIS — M25569 Pain in unspecified knee: Secondary | ICD-10-CM | POA: Insufficient documentation

## 2013-02-14 DIAGNOSIS — R262 Difficulty in walking, not elsewhere classified: Secondary | ICD-10-CM | POA: Insufficient documentation

## 2013-02-14 DIAGNOSIS — M25669 Stiffness of unspecified knee, not elsewhere classified: Secondary | ICD-10-CM | POA: Insufficient documentation

## 2013-02-14 DIAGNOSIS — M25661 Stiffness of right knee, not elsewhere classified: Secondary | ICD-10-CM

## 2013-02-14 NOTE — Progress Notes (Signed)
Physical Therapy Treatment Patient Details  Name: Kathy Dawson MRN: [id number] Date of Birth: [birth year]  Today's Date: 02/14/2013 Time: 7829-5621 PT Time Calculation (min): 57 min Charge: TE 42' 1518-1559, Manual 15' 1600-1615  Visit#: 5 of 12  Re-eval: 03/07/13 Assessment Diagnosis: Rt TKR Surgical Date: 01/15/13 Next MD Visit: Dr. Lamar Sprinkles: July 18th  Subjective: Symptoms/Limitations Symptoms: Pt reported increased pain last session, stated her pain scale was 10/10 this morning, has taken pain meds and now 5/10. Pain Assessment Currently in Pain?: Yes Pain Score: 5  Pain Location: Knee Pain Orientation: Right  Precautions/Restrictions  Precautions Precaution Comments: no pulling on incision, MD protocol  Exercise/Treatments Stretches Active Hamstring Stretch: 3 reps;30 seconds Quad Stretch: 3 reps;30 seconds Aerobic Stationary Bike: 8' no resistance full revolution seat 9 Standing Heel Raises: 15 reps Heel Raises Limitations: Toe raises 15x Knee Flexion: Right;10 reps Functional Squat: 15 reps;Limitations Functional Squat Limitations: minisquats Supine Quad Sets: AROM;15 reps Short Arc Quad Sets: Right;15 reps Sidelying Hip ABduction: 15 reps;Right Prone  Hamstring Curl: 15 reps Hip Extension: 15 reps   Manual Therapy Manual Therapy: Edema management Edema Management: Retro massage to reduce edema with MFR to Rt anterior knee and scar tissue massage to reduce adhesions, avoiding direct contract with incision 3086-5784  Physical Therapy Assessment and Plan PT Assessment and Plan Clinical Impression Statement: Pt continues to improve in AROM, able to decrease to seat 9 (was 10 last session).  PROM 0-93 today.  Manual retro massage complete to reduce edema, Myofascial techniques and scar tissue massage complete to reduce fascial restrictions and decrease adhesions.  Pt reported pain 4 1/2-5/10 at end of session.  Plans to ice knee for pain and edeema control at  home.  PT Plan: Continue with current POC per MD protocol.  Continue with gentle ROM and strengthening exercises.  Next session may need to add manual techniques to reduce edema and fascial restrictions to IT band.    Goals    Problem List Patient Active Problem List   Diagnosis Date Noted  . Difficulty in walking(719.7) 02/05/2013  . Stiffness of right knee 02/05/2013  . Rectal bleeding 11/27/2011  . Constipation 11/27/2011  . DERANGEMENT MENISCUS 08/26/2008  . KNEE, ARTHRITIS, DEGEN./OSTEO 07/25/2008  . KNEE PAIN 07/25/2008    PT - End of Session Activity Tolerance: Patient tolerated treatment well;Patient limited by pain General Behavior During Therapy: [facility] for tasks assessed/performed Cognition: WFL for tasks performed  GP    Juel Burrow 02/14/2013, 4:32 PM

## 2013-02-15 ENCOUNTER — Ambulatory Visit (HOSPITAL_COMMUNITY)
Admission: RE | Admit: 2013-02-15 | Discharge: 2013-02-15 | Disposition: A | Discharge status: Home / Self Care | Payer: BC Managed Care – PPO | Source: Ambulatory Visit | Attending: Orthopaedic Surgery | Admitting: Orthopaedic Surgery

## 2013-02-15 DIAGNOSIS — M25661 Stiffness of right knee, not elsewhere classified: Secondary | ICD-10-CM

## 2013-02-15 DIAGNOSIS — R262 Difficulty in walking, not elsewhere classified: Secondary | ICD-10-CM

## 2013-02-15 NOTE — Progress Notes (Signed)
Physical Therapy Treatment Patient Details  Name: Kathy Dawson MRN: [id number] Date of Birth: [birth year]  Today's Date: 02/15/2013 Time: 4098-1191 PT Time Calculation (min): 49 min Charge : TE 22' 1438-1500, Manual 15' 1500-1515, Ice [phone]  Visit#: 6 of 123  Re-eval: 03/07/13   Subjective: Symptoms/Limitations Symptoms: Pt reported pain relief and edema reduce following manual last session.  Pt stated pain scale 3/10 Rt knee today. Pain Assessment Currently in Pain?: Yes Pain Score: 3  Pain Location: Knee Pain Orientation: Right  Objective :  Exercise/Treatments Stretches Active Hamstring Stretch: 3 reps;30 seconds Quad Stretch: 3 reps;30 seconds Aerobic Stationary Bike: 8' no resistance full revolution seat 8 Standing Heel Raises: 15 reps Heel Raises Limitations: Toe raises 15x Knee Flexion: Right;15 reps Functional Squat: 15 reps Supine Quad Sets: AROM;15 reps;Limitations Quad Sets Limitations: 10" holds Prone  Hamstring Curl: 15 reps Hip Extension: 15 reps Other Prone Exercises: TKE 10x10"   Modalities Modalities: Cryotherapy Manual Therapy Manual Therapy: Edema management Edema Management: Retro massage with LE elevated to reduce edema with MFR to Rt anterior knee and scar tissue massage to reduce adhesions, avoiding direct contract with incision 1500-1515 Cryotherapy Number Minutes Cryotherapy: 10 Minutes Cryotherapy Location: Knee Type of Cryotherapy: Ice pack  Physical Therapy Assessment and Plan PT Assessment and Plan Clinical Impression Statement: Pt continues to improve AROM, able to scoot up seat to 8 (was 9 last session).  Improved distal quadricep activation this session.. Noted signifiant reduction of edema following manual retro massage. PT Plan: Continue with current POC per MD protocol.  Continue with gentle ROM and strengthening exercises      Goals    Problem List Patient Active Problem List   Diagnosis Date Noted  . Difficulty in  walking(719.7) 02/05/2013  . Stiffness of right knee 02/05/2013  . Rectal bleeding 11/27/2011  . Constipation 11/27/2011  . DERANGEMENT MENISCUS 08/26/2008  . KNEE, ARTHRITIS, DEGEN./OSTEO 07/25/2008  . KNEE PAIN 07/25/2008    PT - End of Session Activity Tolerance: Patient tolerated treatment well General Behavior During Therapy: WFL for tasks assessed/performed Cognition: WFL for tasks performed  GP    Juel Burrow 02/15/2013, 3:57 PM

## 2013-02-19 ENCOUNTER — Encounter: Payer: BC Managed Care – PPO | Admitting: Physical Therapy

## 2013-02-19 ENCOUNTER — Ambulatory Visit (HOSPITAL_COMMUNITY)
Admission: RE | Admit: 2013-02-19 | Discharge: 2013-02-19 | Disposition: A | Discharge status: Home / Self Care | Payer: BC Managed Care – PPO | Source: Ambulatory Visit | Attending: Orthopaedic Surgery | Admitting: Orthopaedic Surgery

## 2013-02-19 NOTE — Progress Notes (Signed)
Physical Therapy Treatment Patient Details  Name: Kathy Dawson MRN: [id number] Date of Birth: June 05, 1956  Today's Date: 02/19/2013 Time: 1310-1408 PT Time Calculation (min): 58 min Visit#: 7 of 12  Re-eval: 03/07/13 Charges:  therex 1310-1340 (30'), manual 1341-1351 (10'), IFES/ice 4098-119147(82')   Subjective: Symptoms/Limitations Symptoms: Pt was late for appt. States she just doesnt feel good today. Pt reports both of her LE's are swollen today.  Pt gives pain a rating of 0/10 at rest but increases to 8/10 with movement.   Exercise/Treatments  Aerobic Stationary Bike: 8' resistance 3 seat 8 Supine Quad Sets: AROM;15 reps;Limitations Quad Sets Limitations: 10" holds Short Arc Quad Sets: Right;15 reps Straight Leg Raises: Right;15 reps Sidelying Hip ABduction: 15 reps;Right Prone  Hamstring Curl: 20 reps Hip Extension: 20 reps Other Prone Exercises: TKE 15x10"   Modalities Modalities: Cryotherapy;Electrical Stimulation Manual Therapy Manual Therapy: Other (comment) Myofascial Release: MFR to anterior Rt. anterior knee to reduce adhesions, avoiding direct contact with incision Cryotherapy Number Minutes Cryotherapy: 15 Minutes Cryotherapy Location: Knee Type of Cryotherapy: Ice pack Pharmacologist Location: to decrease swelling/pain Electrical Stimulation Action: Hi volt to decrease pain associated with swelling Electrical Stimulation Parameters: Hi volt, 200 Votls with ice and elevation X 15 minutes  Physical Therapy Assessment and Plan PT Assessment and Plan Assessment:  Continued per POC; Pt discouraged/depressed today about her pain level and just feeling "blah".  Added hi volt estim today to decrease swelling and pain.  Pt reported overall pain reduction following session and improved demeanor.  Held standing exercises today due to increase in pain. PT Plan: Continue with current POC per MD protocol.  Continue with gentle ROM and  strengthening exercises       Problem List Patient Active Problem List   Diagnosis Date Noted  . Difficulty in walking(719.7) 02/05/2013  . Stiffness of right knee 02/05/2013  . Rectal bleeding 11/27/2011  . Constipation 11/27/2011  . DERANGEMENT MENISCUS 08/26/2008  . KNEE, ARTHRITIS, DEGEN./OSTEO 07/25/2008  . KNEE PAIN 07/25/2008    PT - End of Session Activity Tolerance: Patient tolerated treatment well General Behavior During Therapy: [facility] for tasks assessed/performed Cognition: WFL for tasks performed   Lurena Nida, PTA/CLT 02/19/2013, 1:56 PM

## 2013-02-21 ENCOUNTER — Ambulatory Visit (HOSPITAL_COMMUNITY)
Admission: RE | Admit: 2013-02-21 | Discharge: 2013-02-21 | Disposition: A | Discharge status: Home / Self Care | Payer: BC Managed Care – PPO | Source: Ambulatory Visit | Attending: Orthopaedic Surgery | Admitting: Orthopaedic Surgery

## 2013-02-21 DIAGNOSIS — R262 Difficulty in walking, not elsewhere classified: Secondary | ICD-10-CM

## 2013-02-21 DIAGNOSIS — M25661 Stiffness of right knee, not elsewhere classified: Secondary | ICD-10-CM

## 2013-02-21 NOTE — Progress Notes (Signed)
Physical Therapy Treatment Patient Details  Name: Kathy Dawson MRN: [id number] Date of Birth: 1955/08/29  Today's Date: 02/21/2013 Time: 4098-1191 PT Time Calculation (min): 65 min Charge  Manual 1545-1555, TE 1515-1545, Estim [phone], ice x 1  Visit#: 8 of 12  Re-eval: 03/07/13 Assessment Diagnosis: Rt TKR Surgical Date: 01/15/13 Next MD Visit: Dr. Lamar Sprinkles: July 18th  Subjective: Symptoms/Limitations Symptoms: Pt reported great results following estim added last session.  Reported knee is sore today but edema decreased significantly.   Pain Assessment Currently in Pain?: Yes Pain Score: 2  Pain Location: Knee Pain Orientation: Right  Precautions/Restrictions  Precautions Precaution Comments: no pulling on incision, MD protocol  Exercise/Treatments Stretches Passive Hamstring Stretch: 3 reps;30 seconds Aerobic Stationary Bike: 8' resistance 3 seat 6 Supine Quad Sets: AROM;15 reps;Limitations Quad Sets Limitations: 10" holds Short Arc Quad Sets: Right;15 reps Straight Leg Raises: Right;15 reps Sidelying Hip ABduction: 15 reps;Right Prone  Hamstring Curl: 20 reps Hip Extension: 20 reps Other Prone Exercises: TKE 15x10"   Modalities Modalities: Cryotherapy;Electrical Stimulation Manual Therapy Myofascial Release: MFR to anterior Rt. anterior knee to reduce adhesions, avoiding direct contact with incision 1545-1555 Electrical Stimulation Electrical Stimulation Location: to decrease swelling/pain Electrical Stimulation Action: Hi volt to decrease pain associated with swelling Electrical Stimulation Parameters: Hi volt, 200 Votls with ice and elevation X 15 minutes Electrical Stimulation Goals: Edema;Pain  Physical Therapy Assessment and Plan PT Assessment and Plan Clinical Impression Statement: Pt continues to improve AROM, able to scoot up seat to 6 this session (was 8 last session).  Distal quadricps activaiotn improving, less tactile cueing required for  contraction.  Continued hi volt estim for edema reduction with noted significant reduction in swelling and pain.   PT Plan: Continue with current POC per MD protocol.  Continue with gentle ROM and strengthening exercises      Goals    Problem List Patient Active Problem List   Diagnosis Date Noted  . Difficulty in walking(719.7) 02/05/2013  . Stiffness of right knee 02/05/2013  . Rectal bleeding 11/27/2011  . Constipation 11/27/2011  . DERANGEMENT MENISCUS 08/26/2008  . KNEE, ARTHRITIS, DEGEN./OSTEO 07/25/2008  . KNEE PAIN 07/25/2008    PT - End of Session Activity Tolerance: Patient tolerated treatment well General Behavior During Therapy: WFL for tasks assessed/performed Cognition: WFL for tasks performed  GP    Juel Burrow 02/21/2013, 5:44 PM

## 2013-02-23 ENCOUNTER — Ambulatory Visit (HOSPITAL_COMMUNITY)
Admission: RE | Admit: 2013-02-23 | Discharge: 2013-02-23 | Disposition: A | Discharge status: Home / Self Care | Payer: BC Managed Care – PPO | Source: Ambulatory Visit | Attending: Family Medicine | Admitting: Family Medicine

## 2013-02-23 DIAGNOSIS — R262 Difficulty in walking, not elsewhere classified: Secondary | ICD-10-CM

## 2013-02-23 DIAGNOSIS — M25661 Stiffness of right knee, not elsewhere classified: Secondary | ICD-10-CM

## 2013-02-23 NOTE — Progress Notes (Signed)
Physical Therapy Treatment Patient Details  Name: Kathy Dawson MRN: [id number] Date of Birth: 08-05-1956  Today's Date: 02/23/2013 Time: 4098-1191 PT Time Calculation (min): 60 min Charge: Self care 8' 1515-1523, TE 22' 1523-1545, Manual 10' 1545-1555, Estim x 15 4782-9562  Visit#: 9 of 12  Re-eval: 03/07/13 Assessment Diagnosis: Rt TKR Surgical Date: 01/15/13 Next MD Visit: Dr. Lamar Sprinkles: July 18th  Subjective: Symptoms/Limitations Symptoms: Pt stated trying to decrease medication and reported increased anxiety yesterday. Pain Assessment Currently in Pain?: Yes Pain Score: 2  Pain Location: Knee Pain Orientation: Right  Precautions/Restrictions  Precautions Precaution Comments: no pulling on incision, MD protocol  Exercise/Treatments Stretches Active Hamstring Stretch: 3 reps;30 seconds Aerobic Stationary Bike: 6' resistance 3 seat 6 Standing Lateral Step Up: Right;10 reps;Hand Hold: 2;Step Height: 2" Forward Step Up: Right;10 reps;Step Height: 4";Hand Hold: 1 Supine Quad Sets: AROM;15 reps;Limitations Quad Sets Limitations: 10" holds Short [employer] Sets: Right;15 reps  Modalities Modalities: Cryotherapy;Electrical Stimulation Manual Therapy Manual Therapy: Myofascial release Myofascial Release: MFR to anterior Rt. anterior knee to reduce adhesions, avoiding direct contact with incision 1545-1555 Cryotherapy Number Minutes Cryotherapy: 15 Minutes Cryotherapy Location: Knee Type of Cryotherapy: Ice pack Pharmacologist Location: to decrease swelling/pain Electrical Stimulation Action: Hi volt to decrease pain associated with swelling Electrical Stimulation Parameters: Hi volt, 200 Votls with ice and elevation X 15 minutes Electrical Stimulation Goals: Edema;Pain  Physical Therapy Assessment and Plan PT Assessment and Plan Clinical Impression Statement: Pt instructed different techniques to assist with anxiety from decreased  medication intake.  Pt continues to improve AROM and distal quadricpep strengthening.  Began forward and lateral stair training for functional quad strenthening with min cueing for technique.  Manual techniques to reduce fascial restricions around incision.  Ended session with hi volt estim for edema control, significant improvement noted. PT Plan: Re-eval next week prior MD apt.  Continue with current POC per MD protocol.  Continue with gentle ROM and strengthening exercises      Goals    Problem List Patient Active Problem List   Diagnosis Date Noted  . Difficulty in walking(719.7) 02/05/2013  . Stiffness of right knee 02/05/2013  . Rectal bleeding 11/27/2011  . Constipation 11/27/2011  . DERANGEMENT MENISCUS 08/26/2008  . KNEE, ARTHRITIS, DEGEN./OSTEO 07/25/2008  . KNEE PAIN 07/25/2008    PT - End of Session Activity Tolerance: Patient tolerated treatment well General Behavior During Therapy: WFL for tasks assessed/performed Cognition: WFL for tasks performed PT Plan of Care PT Patient Instructions: Self care,techniques to reduce anxiety [phone]     GP    Juel Burrow 02/23/2013, 4:30 PM

## 2013-02-26 ENCOUNTER — Ambulatory Visit (HOSPITAL_COMMUNITY)
Admission: RE | Admit: 2013-02-26 | Discharge: 2013-02-26 | Disposition: A | Discharge status: Home / Self Care | Payer: BC Managed Care – PPO | Source: Ambulatory Visit | Attending: Family Medicine | Admitting: Family Medicine

## 2013-02-26 DIAGNOSIS — M25661 Stiffness of right knee, not elsewhere classified: Secondary | ICD-10-CM

## 2013-02-26 DIAGNOSIS — R262 Difficulty in walking, not elsewhere classified: Secondary | ICD-10-CM

## 2013-02-26 DIAGNOSIS — M25561 Pain in right knee: Secondary | ICD-10-CM

## 2013-02-26 NOTE — Progress Notes (Signed)
Physical Therapy Treatment Patient Details  Name: Kathy Dawson MRN: [id number] Date of Birth: 01/30/1956  Today's Date: 02/26/2013 Time: 7846-9629 PT Time Calculation (min): 40 min Charges TE: 1435-1500 Manual: 1500-1515 Visit#: 10 of 12  Re-eval: 03/07/13 Assessment Diagnosis: Rt TKR Surgical Date: 01/15/13 Next MD Visit: Dr. Lamar Sprinkles: July 18th  Subjective: Symptoms/Limitations Symptoms: Pt reports that she is doing much better than Friday.  Still had a lot of emotional distress this weekend, but is feeling better today.  Comes in today without RW.  Pain Assessment Currently in Pain?: Yes Pain Score: 5  Pain Location: Knee Pain Orientation: Right Pain Type: Acute pain;Surgical pain    Exercise/Treatments Aerobic Stationary Bike: 6' resistance 3 seat 6 Standing Lateral Step Up: Right;10 reps;Step Height: 2" Forward Step Up: Right;10 reps;Step Height: 4";Hand Hold: 0 Step Down: Right;10 reps;Hand Hold: 1;Step Height: 2" Functional Squat: 20 reps Rocker Board: 2 minutes (S<>S) Supine Quad Sets: 20 reps Quad Sets Limitations: 10 sec holds Heel Slides: 20 reps;Limitations Heel Slides Limitations: 10 sec holds  Manual Therapy Manual Therapy: Myofascial release Myofascial Release: MFR to anterior Rt. anterior knee to reduce adhesions, avoiding direct contact with incision  Physical Therapy Assessment and Plan PT Assessment and Plan Clinical Impression Statement: Continuews to make progress with AROM (0-110) and strength.  Educated pt on proper use of SPC at end of session if she needs to help control pain.  Greatest limitiation is with balance and pain.  Declined modalities today secondary to decreaed overall pain.  PT Plan: Re-eval for MD apt    Goals    Problem List Patient Active Problem List   Diagnosis Date Noted  . Difficulty in walking(719.7) 02/05/2013  . Stiffness of right knee 02/05/2013  . Rectal bleeding 11/27/2011  . Constipation 11/27/2011  .  DERANGEMENT MENISCUS 08/26/2008  . KNEE, ARTHRITIS, DEGEN./OSTEO 07/25/2008  . KNEE PAIN 07/25/2008    PT - End of Session Activity Tolerance: Patient tolerated treatment well General Behavior During Therapy: WFL for tasks assessed/performed Cognition: WFL for tasks performed PT Plan of Care PT Patient Instructions: Self care,techniques to reduce anxiety 1515-1523     GP    Kathy Dawson, MPT, ATC 02/26/2013, 3:21 PM

## 2013-02-28 ENCOUNTER — Ambulatory Visit (HOSPITAL_COMMUNITY)
Admission: RE | Admit: 2013-02-28 | Discharge: 2013-02-28 | Disposition: A | Discharge status: Home / Self Care | Payer: BC Managed Care – PPO | Source: Ambulatory Visit | Attending: Family Medicine | Admitting: Family Medicine

## 2013-02-28 DIAGNOSIS — R262 Difficulty in walking, not elsewhere classified: Secondary | ICD-10-CM

## 2013-02-28 DIAGNOSIS — M25661 Stiffness of right knee, not elsewhere classified: Secondary | ICD-10-CM

## 2013-02-28 NOTE — Progress Notes (Signed)
Physical Therapy Re-Evaluation  Patient Details  Name: Kathy Dawson MRN: [id number] Date of Birth: [birth year]  Today's Date: 02/28/2013 Time: 4098-1191 PT Time Calculation (min): 68 min Charge: TE 4782-9562, MMT x1 /ROM Measurement (no charge)(1535-1544),  Manual [phone]),  Estim x 10 and Ice x 1 4696-2952             Visit#: 11 of 23  Re-eval: 03/28/13 Assessment Diagnosis: Rt TKR Surgical Date: 01/15/13 Next MD Visit: Dr. Lamar Sprinkles: July 18th  Subjective Symptoms/Limitations Symptoms: Pt entered dept with no AD.  She reports an awesome day yesterday, took pain meds prior therapy today so pain scale 2/10 currently. How long can you sit comfortably?: Able to sit comfortably for 2-3 hours with LE proped up, 30 minutes with feet dangling How long can you stand comfortably?: Able to stand comfortably for 1 hour How long can you walk comfortably?: Able to walk comfortably for 2-3 hours Pain Assessment Currently in Pain?: Yes Pain Score: 2  Pain Location: Knee Pain Orientation: Right  Precautions/Restrictions  Precautions Precaution Comments: no pulling on incision, MD protocol  Objective:  Sensation/Coordination/Flexibility/Functional Tests Functional Tests Functional Tests: Lower Exremity Functional Scale (LEFS): 46/80 (LEFS was 12/80)  Assessment RLE AROM (degrees) Right Knee Extension: 1 Right Knee Flexion: 110 RLE PROM (degrees) Right Knee Extension: 0 Right Knee Flexion: 115 RLE Strength Right Hip Flexion: 4/5 (was 3/5) Right Hip Extension: 3+/5 (was 3-/5) Right Hip ABduction:  (4+/5 was 3/5) Right Knee Flexion: 3+/5 (was 3/5) Right Knee Extension: 3+/5 (was 2+/5)  Exercise/Treatments Aerobic Stationary Bike: 6' resistance 4.0 seat 6 Standing Lateral Step Up: Right;15 reps;Hand Hold: 2 Forward Step Up: Right;10 reps;Step Height: 4";Hand Hold: 0 Step Down: Right;10 reps;Hand Hold: 0;Step Height: 2" Rocker Board: 2 minutes (R/L no HHA) SLS: Rt 60" max of  2 SLS with Vectors: 3x 5" no HHA Supine Quad Sets: 20 reps Quad Sets Limitations: 10 sec holds Heel Slides: 20 reps;Limitations Heel Slides Limitations: 10 sec holds  Modalities Modalities: Cryotherapy;Electrical Stimulation Manual Therapy Myofascial Release: MFR to anterior Rt. anterior knee to reduce adhesions, avoiding direct contact with incision 1545-1555 Cryotherapy Number Minutes Cryotherapy: 10 Minutes Cryotherapy Location: Knee Type of Cryotherapy: Ice pack Pharmacologist Location: to decrease swelling/pain Electrical Stimulation Action: Hi volt to decrease pain associated with swelling Electrical Stimulation Parameters: Hi volt, 200 Votls with ice and elevation Electrical Stimulation Goals: Edema;Pain  Physical Therapy Assessment and Plan PT Assessment and Plan Clinical Impression Statement: Re-eval complete prior MD apt with significant gains.  Mrs Dillehay has had 11 OPPT sessions over 3 weeks with the following findings:  Pt is independent with HEP daily and able to demonstrate appropriate technique with all exercises.  AROM increased 0-110 with PROM 115 degrees flexion.  Strength improving all LE muscualture, pt now ambulating with no AD indoor and outdoors safely with slight atalgic gait mechanics.  Anterior knee continues to present fascial restirion to her scar though have decreased signifiantly with manual techniques to knee.  Pt with improved proprioception with ability to static surface SLS 60" indpendently.  Pt with improved perceived LE functional scale abilities.   PT Plan: Recommend continuing OPPT x 4 more weeks to address remaining deficits including gait mechanics, strength, AROM, and manual techniques to reduce fascial restrictions and pain.      Goals Home Exercise Program Pt/caregiver will Perform Home Exercise Program: Independently: Met (daily) PT Short Term Goals: 4 weeks PT Short Term Goal 1: Pt will improve her knee AROM  0-105 degrees for greater ease with sit to stand activities.: Met PT Short Term Goal 2: Pt will improve her RLE strength in order to ambulate independently in indoor environments. 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 : Progressing toward goal PT Long Term Goal 2: Pt will improve her Rt knee AROM 0-110 degrees for greater ease with sit to stand activities: Met (AROM 0-110) Long Term Goal 3: Pt willi improve her RLE strength to [facility] in order to tolerate ambulating for 60 minutes on outdoor surfaces to attend community activities. : Progressing toward goal Long Term Goal 4: Pt will improve her LEFS to 60/80 for improved percieved functional ability.: Progressing toward goal (LEFS 46/80)  Problem List Patient Active Problem List   Diagnosis Date Noted  . Difficulty in walking(719.7) 02/05/2013  . Stiffness of right knee 02/05/2013  . Rectal bleeding 11/27/2011  . Constipation 11/27/2011  . DERANGEMENT MENISCUS 08/26/2008  . KNEE, ARTHRITIS, DEGEN./OSTEO 07/25/2008  . KNEE PAIN 07/25/2008    PT - End of Session Activity Tolerance: Patient tolerated treatment well General Behavior During Therapy: WFL for tasks assessed/performed Cognition: WFL for tasks performed  Juel Burrow; Annett Fabian, MPT, ATC 02/28/2013, 6:53 PM

## 2013-03-02 ENCOUNTER — Ambulatory Visit (HOSPITAL_COMMUNITY)
Admission: RE | Admit: 2013-03-02 | Discharge: 2013-03-02 | Disposition: A | Discharge status: Home / Self Care | Payer: BC Managed Care – PPO | Source: Ambulatory Visit | Attending: Orthopaedic Surgery | Admitting: Orthopaedic Surgery

## 2013-03-02 DIAGNOSIS — R262 Difficulty in walking, not elsewhere classified: Secondary | ICD-10-CM

## 2013-03-02 DIAGNOSIS — M25661 Stiffness of right knee, not elsewhere classified: Secondary | ICD-10-CM

## 2013-03-02 NOTE — Progress Notes (Signed)
Physical Therapy Treatment Patient Details  Name: Kathy Dawson MRN: [id number] Date of Birth: 07/27/1956  Today's Date: 03/02/2013 Time: 7846-9629 PT Time Calculation (min): 57 min Charge : TE 32' 1515-1547, Manual [phone], Estim with ice (1600-1610)  Visit#: 12 of 23  Re-eval: 03/28/13 Assessment Diagnosis: Rt TKR Surgical Date: 01/15/13 Next MD Visit: Dr. Lamar Sprinkles: 4 weeks from July 18th  Subjective: Symptoms/Limitations Symptoms: Pt stated MD happy with progress.  Pt reported taking pain meds this morning, pain scale 0/10 with no movement, increase pain to 3/10 with movement. Pain Assessment Currently in Pain?: Yes Pain Score: 3  Pain Location: Knee Pain Orientation: Right  Precautions/Restrictions  Precautions Precaution Comments: no pulling on incision, MD protocol  Exercise/Treatments Stretches Gastroc Stretch: 3 reps;30 seconds;Limitations Gastroc Stretch Limitations: slant board Aerobic Stationary Bike: 6' resistance 4.0 seat 5 Standing Terminal Knee Extension: Right;Theraband Theraband Level (Terminal Knee Extension): Level 4 (Blue) Lateral Step Up: Right;15 reps;Hand Hold: 2 Forward Step Up: Right;10 reps;Step Height: 4";Hand Hold: 0 Step Down: Right;10 reps;Hand Hold: 0;Step Height: 2" Rocker Board: 2 minutes SLS with Vectors: 3x 5" no HHA Rebounder:    Supine Short Arc Quad Sets: Right;15 reps Terminal Knee Extension: 10 reps;Limitations Terminal Knee Extension Limitations: 5" holds   Modalities Modalities: Cryotherapy;Electrical Stimulation Manual Therapy Manual Therapy: Myofascial release Myofascial Release: MFR to anterior Rt. anterior knee to reduce adhesions, avoiding direct contact with incision [id number] Cryotherapy Number Minutes Cryotherapy: 10 Minutes Cryotherapy Location: Knee Type of Cryotherapy: Ice pack Pharmacologist Location: to decrease swelling/pain Electrical Stimulation Action: Hi volt to  decrease pain associated with swelling Electrical Stimulation Parameters: Hi volt, 200 Votls with ice and elevation Electrical Stimulation Goals: Edema;Pain  Physical Therapy Assessment and Plan PT Assessment and Plan Clinical Impression Statement: Progressed quad strength this session.  Added standing TKE and pt able to complete supine TKE as well with min cueing for technique.  Improved ease with stair training this session, continues to have weak concentric and eccentric quad control..  Added gastroc stretches for muscule lengthening to assist wtih knee extension.  Ended session with estim and ice for edema and pain control.   PT Plan: Continue with current POC for gait mechanics, strengthening, increase AROM and manual techniques to reduce fascial restrictions and pain.      Goals    Problem List Patient Active Problem List   Diagnosis Date Noted  . Difficulty in walking(719.7) 02/05/2013  . Stiffness of right knee 02/05/2013  . Rectal bleeding 11/27/2011  . Constipation 11/27/2011  . DERANGEMENT MENISCUS 08/26/2008  . KNEE, ARTHRITIS, DEGEN./OSTEO 07/25/2008  . KNEE PAIN 07/25/2008    PT - End of Session Activity Tolerance: Patient tolerated treatment well General Behavior During Therapy: WFL for tasks assessed/performed Cognition: WFL for tasks performed  GP    Juel Burrow 03/02/2013, 4:19 PM

## 2013-03-06 ENCOUNTER — Ambulatory Visit (HOSPITAL_COMMUNITY)
Admission: RE | Admit: 2013-03-06 | Discharge: 2013-03-06 | Disposition: A | Discharge status: Home / Self Care | Payer: BC Managed Care – PPO | Source: Ambulatory Visit | Attending: Family Medicine | Admitting: Family Medicine

## 2013-03-06 DIAGNOSIS — R262 Difficulty in walking, not elsewhere classified: Secondary | ICD-10-CM

## 2013-03-06 DIAGNOSIS — M25661 Stiffness of right knee, not elsewhere classified: Secondary | ICD-10-CM

## 2013-03-06 NOTE — Progress Notes (Signed)
Physical Therapy Treatment Patient Details  Name: Kathy Dawson MRN: [id number] Date of Birth: 1955/08/17  Today's Date: 03/06/2013 Time: 2956-2130 PT Time Calculation (min): 59 min Charge: TE 1428-1500, Manual 1500-1510, Estim with ice 1512-1527  Visit#: 13 of 23  Re-eval: 03/28/13 Assessment Diagnosis: Rt TKR Surgical Date: 01/15/13 Next MD Visit: Dr. Lamar Sprinkles: 4 weeks from July 18th  Subjective: Symptoms/Limitations Symptoms: Pt reported stiffness this session, min pain 2/10.  Pt very proud to share she has been complete supine TKE at home and able to complete correclty.   Pain Assessment Currently in Pain?: Yes Pain Score: 2  Pain Location: Knee Pain Orientation: Right  Precautions/Restrictions  Precautions Precaution Comments: no pulling on incision, MD protocol  Exercise/Treatments Stretches Gastroc Stretch: 3 reps;30 seconds;Limitations Gastroc Stretch Limitations: slant board Aerobic Stationary Bike: 6' resistance 4.0 seat 5 Standing Heel Raises: Limitations Heel Raises Limitations: heel and toe walking 1RT Terminal Knee Extension: Right;Theraband;15 reps Theraband Level (Terminal Knee Extension): Level 4 (Blue) Lateral Step Up: Right;15 reps;Hand Hold: 1;Step Height: 4" Forward Step Up: Right;15 reps;Hand Hold: 0;Step Height: 4" Step Down: Right;10 reps;Hand Hold: 0;Step Height: 2" Functional Squat: 20 reps Rocker Board: 2 minutes;Limitations Rocker Board Limitations: A/P and R/L no HHA SLS with Vectors: 3x 5" no HHA   Modalities Modalities: Cryotherapy;Electrical Stimulation Manual Therapy Manual Therapy: Myofascial release Myofascial Release: MFR to anterior Rt. anterior knee to reduce adhesions, avoiding direct contact with incision Cryotherapy Number Minutes Cryotherapy: 15 Minutes Cryotherapy Location: Knee Type of Cryotherapy: Ice pack Pharmacologist Location: to decrease swelling/pain Electrical Stimulation Action:  Hi volt to decrease pain associated with swelling Electrical Stimulation Parameters: Hi volt, 200 Votls with ice and elevation Electrical Stimulation Goals: Edema;Pain  Physical Therapy Assessment and Plan PT Assessment and Plan Clinical Impression Statement: Pt functional strengthening progressing well towards goals.  Began heel and toe walking to improve gait mechanics and balance.  Increased ease with lateral step ups this session, able to increase to 4 in height.  Continued manual techniques to reduce fascial restrictions and high volt estim for edema and pain control.  Pt with improved gait mechanics at end of session.   PT Plan: Continue with current POC for gait mechanics, strengthening, increase AROM and manual techniques to reduce fascial restrictions and pain.      Goals    Problem List Patient Active Problem List   Diagnosis Date Noted  . Difficulty in walking(719.7) 02/05/2013  . Stiffness of right knee 02/05/2013  . Rectal bleeding 11/27/2011  . Constipation 11/27/2011  . DERANGEMENT MENISCUS 08/26/2008  . KNEE, ARTHRITIS, DEGEN./OSTEO 07/25/2008  . KNEE PAIN 07/25/2008    PT - End of Session Activity Tolerance: Patient tolerated treatment well General Behavior During Therapy: WFL for tasks assessed/performed Cognition: WFL for tasks performed  GP    Juel Burrow 03/06/2013, 6:37 PM

## 2013-03-09 ENCOUNTER — Ambulatory Visit (HOSPITAL_COMMUNITY)
Admission: RE | Admit: 2013-03-09 | Discharge: 2013-03-09 | Disposition: A | Discharge status: Home / Self Care | Payer: BC Managed Care – PPO | Source: Ambulatory Visit | Attending: Family Medicine | Admitting: Family Medicine

## 2013-03-09 DIAGNOSIS — M25561 Pain in right knee: Secondary | ICD-10-CM

## 2013-03-09 DIAGNOSIS — M25661 Stiffness of right knee, not elsewhere classified: Secondary | ICD-10-CM

## 2013-03-09 DIAGNOSIS — R262 Difficulty in walking, not elsewhere classified: Secondary | ICD-10-CM

## 2013-03-09 NOTE — Progress Notes (Signed)
Physical Therapy Treatment Patient Details  Name: Kathy Dawson MRN: [id number] Date of Birth: 1956-05-13  Today's Date: 03/09/2013 Time: 4098-1191 PT Time Calculation (min): 39 min Charges:  TE: 4782-9562 Visit#: 14 of 23  Re-eval: 03/28/13    Subjective: Symptoms/Limitations Symptoms: Pt reports that she continues to have stiffness in her knee. Reports the middle of her thigh continues to feel weak.  Pain Assessment Currently in Pain?: Yes Pain Score: 2  Pain Location: Knee Pain Orientation: Right  Precautions/Restrictions     Exercise/Treatments Aerobic Elliptical: NuStep 10 minutes [place] 3 reistance 2 Machines for Strengthening Cybex Knee Flexion: BLE 2PL x15 Standing Terminal Knee Extension: Right;10 reps;Limitations Terminal Knee Extension Limitations: towel on wall 5 sec holds Wall Squat: 15 reps;3 seconds Other Standing Knee Exercises: Heel and toe walking 2 RT Seated Long Arc Quad: Right;15 reps;Weights;Limitations Long Arc Quad Weight: 2 lbs. Long [employer] Limitations: 3 sec holds Supine Short Arc [employer] Sets: Right;15 reps;Limitations Water quality scientist Limitations: 4# 5 sec holds Heel Slides: Right;20 reps Straight Leg Raises: Right;5 reps;Limitations Straight Leg Raises Limitations: floating with 3 sec hold Sidelying   Prone         Physical Therapy Assessment and Plan PT Assessment and Plan Clinical Impression Statement: Continued to progress LE strengthening to improve overall function.  Pt with improved gait mechanics with slow gait.  PT Plan: Continue with current POC for gait mechanics, strengthening, increase AROM and manual techniques to reduce fascial restrictions and pain.      Goals Home Exercise Program Pt/caregiver will Perform Home Exercise Program: Independently PT Goal: Perform Home Exercise Program - Progress: Met PT Short Term Goals Time to Complete Short Term Goals: 4 weeks PT Short Term Goal 1: Pt will improve her knee  AROM 0-105 degrees for greater ease with sit to stand activities. PT Short Term Goal 1 - Progress: Met PT Short Term Goal 2: Pt will improve her RLE strength in order to ambulate independently in indoor environments.  PT Short Term Goal 2 - Progress: Met PT Short Term Goal 3: Pt will present with moderate fascial restrictions to her scar.  PT Short Term Goal 3 - Progress: Progressing toward goal PT Short Term Goal 4: Pt will improve her proprioceptive awareness and demonstrate Rt SLS x30 seconds on solid surface.  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  PT Long Term Goal 1 - Progress: Met PT Long Term Goal 2: Pt will improve her Rt knee AROM 0-110 degrees for greater ease with sit to stand activities.  PT Long Term Goal 2 - Progress: Met Long Term Goal 3: Pt willi improve her RLE strength to [facility] in order to tolerate ambulating for 60 minutes on outdoor surfaces to attend community activities.  Long Term Goal 3 Progress: Progressing toward goal Long Term Goal 4: Pt will improve her LEFS to 60/80 for improved percieved functional ability.  Long Term Goal 4 Progress: Progressing toward goal  Problem List Patient Active Problem List   Diagnosis Date Noted  . Difficulty in walking(719.7) 02/05/2013  . Stiffness of right knee 02/05/2013  . Rectal bleeding 11/27/2011  . Constipation 11/27/2011  . DERANGEMENT MENISCUS 08/26/2008  . KNEE, ARTHRITIS, DEGEN./OSTEO 07/25/2008  . KNEE PAIN 07/25/2008    PT - End of Session Activity Tolerance:  Patient tolerated treatment well General Behavior During Therapy: [facility] for tasks assessed/performed Cognition: WFL for tasks performed  GP    Mazi Brailsford, MPT, ATC 03/09/2013, 3:21 PM

## 2013-03-13 ENCOUNTER — Ambulatory Visit (HOSPITAL_COMMUNITY)
Admission: RE | Admit: 2013-03-13 | Discharge: 2013-03-13 | Disposition: A | Discharge status: Home / Self Care | Payer: BC Managed Care – PPO | Source: Ambulatory Visit | Attending: Family Medicine | Admitting: Family Medicine

## 2013-03-13 DIAGNOSIS — R262 Difficulty in walking, not elsewhere classified: Secondary | ICD-10-CM

## 2013-03-13 DIAGNOSIS — M25661 Stiffness of right knee, not elsewhere classified: Secondary | ICD-10-CM

## 2013-03-13 NOTE — Progress Notes (Signed)
Physical Therapy Treatment Patient Details  Name: Kathy Dawson MRN: [id number] Date of Birth: [birth year]  Today's Date: 03/13/2013 Time: 4098-1191 PT Time Calculation (min): 54 min Charge: TE 27' 1518-1545, Manual 10' 1545-1555, Estim with ice [phone]  Visit#: 15 of 23  Re-eval: 03/28/13   Subjective: Symptoms/Limitations Symptoms: Pt reported increased edema especially at ankle over weekend and yesterday, pain scale 4/10 for Rt knee. Pain Assessment Currently in Pain?: Yes Pain Score: 4  Pain Location: Knee Pain Orientation: Right  Objective:   Exercise/Treatments Stretches Gastroc Stretch: 3 reps;30 seconds;Limitations Gastroc Stretch Limitations: slant board Aerobic Elliptical: NuStep 10 minutes [place] 3 reistance 2 Machines for Strengthening Cybex Knee Extension: partial extension lift 1PL Cybex Knee Flexion: BLE 3PL x15 Standing Terminal Knee Extension: Right;15 reps;Theraband Theraband Level (Terminal Knee Extension): Level 4 (Blue) Seated Long Arc Quad: Right;15 reps;Weights;Limitations Long Arc Quad Weight: 2 lbs. Long [employer] Limitations: 3 sec holds Supine Short Arc Quad Sets: Right;15 reps;Limitations  Modalities Modalities: Cryotherapy;Electrical Stimulation Manual Therapy Manual Therapy: Joint mobilization Joint Mobilization: Patella mobs all directions Cryotherapy Number Minutes Cryotherapy: 10 Minutes Cryotherapy Location: Knee;Ankle Type of Cryotherapy: Ice pack Pharmacologist Location: to decrease swelling/pain Rt ankle Electrical Stimulation Action: Hi volt to decrease pain associated with swelling  Physical Therapy Assessment and Plan PT Assessment and Plan Clinical Impression Statement: Referral sent to MD for thigh high compression hose for edema control for Rt LE.  Pt is continuing to improve in overall functional strength.  Able to increase weight with cybex hamstring strengthening machine and able to  partially lift quad machine today (unable to do last session).  Manual patella mobs complete to reduce the "knee popping" with extension.  Ended session with hivolt estim to Rt ankle to reduce edema with ice on ankle and knee for pain control. PT Plan: Continue with current POC for gait mechanics, strengthening, increase AROM and manual techniques to reduce fascial restrictions and pain.      Goals    Problem List Patient Active Problem List   Diagnosis Date Noted  . Difficulty in walking(719.7) 02/05/2013  . Stiffness of right knee 02/05/2013  . Rectal bleeding 11/27/2011  . Constipation 11/27/2011  . DERANGEMENT MENISCUS 08/26/2008  . KNEE, ARTHRITIS, DEGEN./OSTEO 07/25/2008  . KNEE PAIN 07/25/2008    PT - End of Session Activity Tolerance: Patient tolerated treatment well General Behavior During Therapy: WFL for tasks assessed/performed Cognition: WFL for tasks performed PT Plan of Care PT Patient Instructions: Pt explained benefits of edema control with compression hose and faxed referral to MD for thigh high 20-30 mmHG hose  GP    Juel Burrow 03/13/2013, 6:05 PM

## 2013-03-15 ENCOUNTER — Ambulatory Visit (HOSPITAL_COMMUNITY)
Admission: RE | Admit: 2013-03-15 | Discharge: 2013-03-15 | Disposition: A | Discharge status: Home / Self Care | Payer: BC Managed Care – PPO | Source: Ambulatory Visit | Attending: Family Medicine | Admitting: Family Medicine

## 2013-03-15 DIAGNOSIS — M25661 Stiffness of right knee, not elsewhere classified: Secondary | ICD-10-CM

## 2013-03-15 DIAGNOSIS — R262 Difficulty in walking, not elsewhere classified: Secondary | ICD-10-CM

## 2013-03-15 NOTE — Progress Notes (Signed)
Physical Therapy Treatment Patient Details  Name: Kathy Dawson MRN: [id number] Date of Birth: [birth year]  Today's Date: 03/15/2013 Time: 4098-1191 PT Time Calculation (min): 1382 min Charge: TE 4782-9562, Manual [phone], Estim with ice 1630-1645  Visit#: 16 of 23  Re-eval: 03/28/13 Assessment Diagnosis: Rt TKR Surgical Date: 01/15/13 Next MD Visit: Dr. Lamar Sprinkles: 4 weeks from July 18th  Subjective: Symptoms/Limitations Symptoms: Pt reported increased difficulty with LAQ feels a deep catch increasing pain.  Pt reported swelling reduced to ankle following estim last session.   Pain Assessment Currently in Pain?: Yes Pain Score: 4  Pain Location: Knee Pain Orientation: Right;Medial  Precautions/Restrictions  Precautions Precaution Comments: no pulling on incision, MD protocol  Exercise/Treatments Stretches Knee: Self-Stretch to increase Flexion: 3 reps;30 seconds;Limitations Knee: Self-Stretch Limitations: 8 in step Gastroc Stretch: 3 reps;30 seconds;Limitations Gastroc Stretch Limitations: slant board Machines for Strengthening Cybex Knee Flexion: BLE 3PL x15 Standing Heel Raises: Limitations Heel Raises Limitations: heel and toe walking 1RT Lateral Step Up: Right;15 reps;Hand Hold: 1;Step Height: 4" Forward Step Up: Right;15 reps;Hand Hold: 0;Step Height: 4" Step Down: Right;10 reps;Hand Hold: 0;Step Height: 2" Wall Squat: 15 reps;5 seconds SLS with Vectors: 5x 5" no HHA Seated Long Arc Quad: Right;15 reps;Weights;Limitations Long Arc Quad Limitations: 3 sec holds Supine Quad Sets: 10 reps   Modalities Modalities: Cryotherapy;Electrical Stimulation Manual Therapy Manual Therapy: Joint mobilization Joint Mobilization: Patella mobs all directions Cryotherapy Number Minutes Cryotherapy: 15 Minutes Cryotherapy Location: Knee Type of Cryotherapy: Ice pack Pharmacologist Location: to decrease swelling/pain Rt knee Electrical  Stimulation Action: Hi volt to decrease pain associated with swelling Electrical Stimulation Parameters: Hi volt, 225 Votls with ice and elevation Electrical Stimulation Goals: Edema;Pain  Physical Therapy Assessment and Plan PT Assessment and Plan Clinical Impression Statement: Noted improved patella mobility following manual with reports of decreased pain with LAQs.  Pt continues to improve in ease with therex for functional strengthening.  Added knee flexion stretch to POC to improve flexion.  Noted reduction in edema for whole LE following hivolt estim with ice and elevation at end of session. PT Plan: Continue with current POC for gait mechanics, strengthening, increase AROM and manual techniques to reduce fascial restrictions and pain.      Goals    Problem List Patient Active Problem List   Diagnosis Date Noted  . Difficulty in walking(719.7) 02/05/2013  . Stiffness of right knee 02/05/2013  . Rectal bleeding 11/27/2011  . Constipation 11/27/2011  . DERANGEMENT MENISCUS 08/26/2008  . KNEE, ARTHRITIS, DEGEN./OSTEO 07/25/2008  . KNEE PAIN 07/25/2008    PT - End of Session Activity Tolerance: Patient tolerated treatment well General Behavior During Therapy: WFL for tasks assessed/performed Cognition: WFL for tasks performed  GP    Juel Burrow 03/15/2013, 5:58 PM

## 2013-03-20 ENCOUNTER — Ambulatory Visit (HOSPITAL_COMMUNITY)
Admission: RE | Admit: 2013-03-20 | Discharge: 2013-03-20 | Disposition: A | Discharge status: Home / Self Care | Payer: BC Managed Care – PPO | Source: Ambulatory Visit | Attending: Orthopaedic Surgery | Admitting: Orthopaedic Surgery

## 2013-03-20 DIAGNOSIS — M25669 Stiffness of unspecified knee, not elsewhere classified: Secondary | ICD-10-CM | POA: Insufficient documentation

## 2013-03-20 DIAGNOSIS — M25569 Pain in unspecified knee: Secondary | ICD-10-CM | POA: Insufficient documentation

## 2013-03-20 DIAGNOSIS — IMO0001 Reserved for inherently not codable concepts without codable children: Secondary | ICD-10-CM | POA: Insufficient documentation

## 2013-03-20 DIAGNOSIS — R262 Difficulty in walking, not elsewhere classified: Secondary | ICD-10-CM | POA: Insufficient documentation

## 2013-03-20 DIAGNOSIS — M25661 Stiffness of right knee, not elsewhere classified: Secondary | ICD-10-CM

## 2013-03-20 NOTE — Progress Notes (Signed)
Physical Therapy Treatment Patient Details  Name: Kathy Dawson MRN: [id number] Date of Birth: Mar 29, 1956  Today's Date: 03/20/2013 Time: 4098-1191 PT Time Calculation (min): 60 min Charge: TE 4782-9562, Manual 1512-1520, Estim with ice 1520-1535  Visit#: 17 of 23  Re-eval: 03/28/13 Assessment Diagnosis: Rt TKR Surgical Date: 01/15/13 Next MD Visit: Dr. Lamar Sprinkles: 03/28/13  Subjective: Symptoms/Limitations Symptoms: Pt reports increased pain Lt knee today pain scale 5/10, Rt knee 3.5/10.  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  Reports standing comfortably for 2 hours Pain Assessment Currently in Pain?: Yes Pain Score: 4  Pain Location: Knee Pain Orientation: Right  Precautions/Restrictions  Precautions Precaution Comments: no pulling on incision, MD protocol  Exercise/Treatments Stretches Gastroc Stretch: 3 reps;30 seconds Aerobic Stationary Bike: 8' resistance 4.0 seat 5 Machines for Strengthening Cybex Knee Flexion: BLE 4PL x15 Standing Heel Raises: Limitations Heel Raises Limitations: heel and toe walking 2RT Forward Lunges: Both;10 reps;5 seconds Lateral Step Up: Right;15 reps;Hand Hold: 1;Step Height: 4" Forward Step Up: Right;15 reps;Hand Hold: 1;Step Height: 6" Step Down: Right;15 reps;Hand Hold: 1;Step Height: 4" Wall Squat: 15 reps;5 seconds   Modalities Modalities: Cryotherapy;Electrical Stimulation Manual Therapy Manual Therapy: Joint mobilization Joint Mobilization: Patella mobs [phone] Cryotherapy Number Minutes Cryotherapy: 15 Minutes Cryotherapy Location: Knee Type of Cryotherapy: Ice pack Pharmacologist Location: to decrease swelling/pain Rt knee Electrical Stimulation Action: Hi volt to decrease pain associated with swelling  Electrical Stimulation Parameters: Hi volt, 225 Votls with ice and elevation Electrical Stimulation Goals: Pain;Edema  Physical Therapy Assessment  and Plan PT Assessment and Plan Clinical Impression Statement: Pt continues to improve functional strengthening, added forward lunges for quad strengthening,  Able to increase step up height for more normalized stair height.  Patella mobility improving following manual to improve tracking for decreased pain with LAQs.  Ended session with hivolt estim and ice for edema control. PT Plan: Continue with current POC for gait mechanics, strengthening, increase AROM and manual techniques to reduce fascial restrictions and pain.  Next session begin stairwell training to improve confidence and technique.    Goals    Problem List Patient Active Problem List   Diagnosis Date Noted  . Difficulty in walking(719.7) 02/05/2013  . Stiffness of right knee 02/05/2013  . Rectal bleeding 11/27/2011  . Constipation 11/27/2011  . DERANGEMENT MENISCUS 08/26/2008  . KNEE, ARTHRITIS, DEGEN./OSTEO 07/25/2008  . KNEE PAIN 07/25/2008    PT - End of Session Activity Tolerance: Patient tolerated treatment well General Behavior During Therapy: WFL for tasks assessed/performed Cognition: WFL for tasks performed  GP    Juel Burrow 03/20/2013, 3:31 PM

## 2013-03-22 ENCOUNTER — Ambulatory Visit (HOSPITAL_COMMUNITY)
Admission: RE | Admit: 2013-03-22 | Discharge: 2013-03-22 | Disposition: A | Discharge status: Home / Self Care | Payer: BC Managed Care – PPO | Source: Ambulatory Visit

## 2013-03-22 NOTE — Progress Notes (Signed)
Physical Therapy Treatment Patient Details  Name: Kathy Dawson MRN: [id number] Date of Birth: 03/30/1956  Today's Date: 03/22/2013 Time: 0802-0853 PT Time Calculation (min): 51 min Visit#: 18 of 23  Re-eval: 03/28/13 Charges:  therex [phone] (35') , IFES/ice [phone] (15')  Subjective: Symptoms/Limitations Symptoms: Pt states the stiffness when first getting up is still the worst part.  Pt reports her feet and hip flexors hurt more than her knee. Pain Assessment Currently in Pain?: Yes Pain Score: 2  Pain Location: Knee Pain Orientation: Right   Exercise/Treatments Stretches Gastroc Stretch: 3 reps;30 seconds;Limitations Gastroc Stretch Limitations: slant board Aerobic Stationary Bike: 10' resistance 4.5 seat 5 Machines for Strengthening Cybex Knee Flexion: BLE 4PL x15 Standing Stairs: 2 flights reciprocally up with 1HR, down 2HR Other Standing Knee Exercises: hip flexor   Modalities Modalities: Cryotherapy;Electrical Stimulation Cryotherapy Number Minutes Cryotherapy: 15 Minutes Cryotherapy Location: Knee Type of Cryotherapy: Ice pack Pharmacologist Location: to decrease swelling/pain Rt knee Electrical Stimulation Action: Hi volt to decrease pain associated with swelling Electrical Stimulation Parameters: Hi volt, 225 Volts with ice and elevation  Physical Therapy Assessment and Plan PT Assessment and Plan Clinical Impression Statement: Began stair negotiation reciprocally today using 1HR ascending, 2HR descending.  Pt instructed with hip flexor stretch for HEP.  Pt continues to work on knee flexion at home, currently with 100 degrees AROM, 115 degrees AAROM PT Plan: Continue with current POC for gait mechanics, strengthening, increase AROM and manual techniques to reduce fascial restrictions and pain.       Problem List Patient Active Problem List   Diagnosis Date Noted  . Difficulty in walking(719.7) 02/05/2013  . Stiffness of  right knee 02/05/2013  . Rectal bleeding 11/27/2011  . Constipation 11/27/2011  . DERANGEMENT MENISCUS 08/26/2008  . KNEE, ARTHRITIS, DEGEN./OSTEO 07/25/2008  . KNEE PAIN 07/25/2008    PT - End of Session Activity Tolerance: Patient tolerated treatment well General Behavior During Therapy: [facility] for tasks assessed/performed Cognition: WFL for tasks performed   Lurena Nida, PTA/CLT 03/22/2013, 8:49 AM

## 2013-03-23 ENCOUNTER — Ambulatory Visit (HOSPITAL_COMMUNITY): Payer: BC Managed Care – PPO | Admitting: Physical Therapy

## 2013-03-27 ENCOUNTER — Ambulatory Visit (HOSPITAL_COMMUNITY): Payer: BC Managed Care – PPO | Admitting: Physical Therapy

## 2013-03-29 ENCOUNTER — Ambulatory Visit (HOSPITAL_COMMUNITY)
Admission: RE | Admit: 2013-03-29 | Discharge: 2013-03-29 | Disposition: A | Discharge status: Home / Self Care | Payer: BC Managed Care – PPO | Source: Ambulatory Visit

## 2013-03-29 DIAGNOSIS — R262 Difficulty in walking, not elsewhere classified: Secondary | ICD-10-CM

## 2013-03-29 DIAGNOSIS — M25661 Stiffness of right knee, not elsewhere classified: Secondary | ICD-10-CM

## 2013-03-29 NOTE — Progress Notes (Signed)
Physical Therapy Re-evaluation/treatment  Patient Details  Name: Kathy Dawson MRN: [id number] Date of Birth: 06-Apr-1956  Today's Date: 03/29/2013 Time: 7829-5621 PT Time Calculation (min): 42 min Charge: MMT/ROM measurement 1440-1444, TE 1430-1438, [id number], Self care [phone]              Visit#: 19 of 24  Re-eval:   Assessment Diagnosis: Rt TKR Surgical Date: 01/15/13 Next MD Visit: Dr. Lamar Sprinkles: 08/03/2013   Subjective Symptoms/Limitations Symptoms: Pt reported MD apt yesterday and MD very happy with progress.  Pr reports pain minimun pain free maybe a 1/10 today.   How long can you sit comfortably?: Able to sit comfortably for 2-3 hours with LE proped up, 30 minutes with feet dangling How long can you stand comfortably?: Able to stand comfortably for 1 hour How long can you walk comfortably?: Able to walk comfortably for 2-3 hours indoors, 20-30 minutes outdoors Pain Assessment Currently in Pain?: Yes Pain Score: 1  Pain Location: Knee Pain Orientation: Right  Precautions/Restrictions  Precautions Precaution Comments: no pulling on incision, MD protocol  Sensation/Coordination/Flexibility/Functional Tests Functional Tests Functional Tests: Lower Exremity Functional Scale (LEFS): 57/80 (was LEFS 46/80)  Assessment RLE AROM (degrees) Right Knee Extension: 0 (was 1) Right Knee Flexion: 115 (was 110) RLE PROM (degrees) Right Knee Extension: 0 (was 0) Right Knee Flexion: 118 (was 115) RLE Strength Right Hip Flexion: 5/5 (was 4/5) Right Hip Extension: 4/5 (was 3+/5) Right Hip ABduction: 5/5 (was 4+/5) Right Hip ADduction:  (4+/5) Right Knee Flexion: 4/5 (was 3+/5) Right Knee Extension:  (4+/5 was 3+/5) Palpation Palpation: moderate fascial restrictions to Rt knee incision  Exercise/Treatments Stretches Gastroc Stretch: 3 reps;30 seconds;Limitations Gastroc Stretch Limitations: slant board Aerobic Stationary Bike: 8' resistance 5.0, seat 4 Standing Stairs: 2  flights reciprocally with 1HR,  Supine Quad Sets: 10 reps Heel Slides: 10 reps Bridges: 15 reps    Physical Therapy Assessment and Plan PT Assessment and Plan Clinical Impression Statement: Re-eval commplete.  Kathy Dawson has had 19 OPPT sessions over 7 weeks with the following findings:  Pt is independent with HEP and able to demonstrate appropriate technique with all exercises.  Pt has met 4/4 STG and 2/4 LTGs and progressing well towards other goals.  Improved AROM 0-115 with PROM to 118 degrees flexion.  Improved functional strength to WNL with abiltity to ambulature indoor and outdoors surfaces with no AD, able to complete reciprocal pattern stair gait without difficulty.  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  Pt with moderate fascial restrictions over scar, pt has been instructued manual techniques to continue working on scar tissue.  Pt with improved self perceived LE functional abilty.  Pt continues to have edema, referral faxed to MD as well as given to pt for thigh high 20-30 mmHg compression hose. PT Plan: D/C to HEP    Goals Home Exercise Program Pt/caregiver will Perform Home Exercise Program: Independently Met (daily) PT Short Term Goals Time to Complete Short Term Goals: 4 weeks PT Short Term Goal 1: Pt will improve her knee AROM 0-105 degrees for greater ease with sit to stand activities.: Met PT Short Term Goal 2: Pt will improve her RLE strength in order to ambulate independently in indoor environments. : Met PT Short Term Goal 3: Pt will present with moderate fascial restrictions to her scar.  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 : Met PT Long Term  Goal 2: Pt will improve her Rt knee AROM 0-110 degrees for greater ease with sit to stand activities. : Met Long Term Goal 3: Pt willi improve her RLE strength to [facility] in order to tolerate ambulating for 60 minutes on outdoor surfaces to attend community activities.  Progressing toward goal (able to ambulate 20-30 minutes outdoors, indoors 2 hours) Long Term Goal 4: Pt will improve her LEFS to 60/80 for improved percieved functional ability. Progressing toward goal (57/80)  Problem List Patient Active Problem List   Diagnosis Date Noted  . Difficulty in walking(719.7) 02/05/2013  . Stiffness of right knee 02/05/2013  . Rectal bleeding 11/27/2011  . Constipation 11/27/2011  . DERANGEMENT MENISCUS 08/26/2008  . KNEE, ARTHRITIS, DEGEN./OSTEO 07/25/2008  . KNEE PAIN 07/25/2008    PT - End of Session Activity Tolerance: Patient tolerated treatment well General Behavior During Therapy: WFL for tasks assessed/performed Cognition: WFL for tasks performed PT Plan of Care PT Patient Instructions: Answered questions pt had about what HEP to continue, given referral paperwork/faxed to MD for thigh high 20-30 mmHg compression hose, reviewed techniques with manual scar tissue   GP    Juel Burrow 03/29/2013, 5:59 PM

## 2013-03-30 ENCOUNTER — Ambulatory Visit (HOSPITAL_COMMUNITY): Payer: BC Managed Care – PPO | Admitting: Physical Therapy

## 2013-03-30 NOTE — Progress Notes (Signed)
Kathy Dawson, MPT, ATC 

## 2013-06-04 ENCOUNTER — Encounter (HOSPITAL_COMMUNITY): Payer: BC Managed Care – PPO | Attending: Hematology and Oncology

## 2013-06-04 ENCOUNTER — Encounter (HOSPITAL_COMMUNITY): Payer: Self-pay

## 2013-06-04 VITALS — BP 114/75 | HR 87 | Temp 97.3°F | Resp 16 | Ht 64.75 in | Wt 199.8 lb

## 2013-06-04 DIAGNOSIS — D509 Iron deficiency anemia, unspecified: Secondary | ICD-10-CM | POA: Insufficient documentation

## 2013-06-04 DIAGNOSIS — D473 Essential (hemorrhagic) thrombocythemia: Secondary | ICD-10-CM

## 2013-06-04 DIAGNOSIS — D649 Anemia, unspecified: Secondary | ICD-10-CM

## 2013-06-04 LAB — CBC WITH DIFFERENTIAL/PLATELET
Basophils Absolute: 0 10*3/uL (ref 0.0–0.1)
Basophils Relative: 0 % (ref 0–1)
HCT: 30.9 % — ABNORMAL LOW (ref 36.0–46.0)
Hemoglobin: 9.4 g/dL — ABNORMAL LOW (ref 12.0–15.0)
Lymphocytes Relative: 36 % (ref 12–46)
Monocytes Relative: 6 % (ref 3–12)
Neutro Abs: 2.9 10*3/uL (ref 1.7–7.7)
Neutrophils Relative %: 50 % (ref 43–77)
RBC: 4.36 MIL/uL (ref 3.87–5.11)
WBC: 6 10*3/uL (ref 4.0–10.5)

## 2013-06-04 LAB — IRON AND TIBC
Saturation Ratios: 4 % — ABNORMAL LOW (ref 20–55)
UIBC: 385 ug/dL (ref 125–400)

## 2013-06-04 LAB — COMPREHENSIVE METABOLIC PANEL
AST: 17 U/L (ref 0–37)
Albumin: 3.6 g/dL (ref 3.5–5.2)
Alkaline Phosphatase: 169 U/L — ABNORMAL HIGH (ref 39–117)
Calcium: 9.8 mg/dL (ref 8.4–10.5)
Chloride: 102 mEq/L (ref 96–112)
GFR calc Af Amer: >90 mL/min (ref >90)
GFR calc non Af Amer: 82 mL/min — ABNORMAL LOW (ref >90)
Glucose, Bld: 78 mg/dL (ref 70–99)
Potassium: 3.8 mEq/L (ref 3.5–5.1)
Total Bilirubin: <0.1 mg/dL — ABNORMAL LOW (ref 0.3–1.2)

## 2013-06-04 LAB — RETICULOCYTES
RBC.: 4.36 MIL/uL (ref 3.87–5.11)
Retic Count, Absolute: 52.3 10*3/uL (ref 19.0–186.0)
Retic Ct Pct: 1.2 % (ref 0.4–3.1)

## 2013-06-04 NOTE — Patient Instructions (Signed)
[facility] Discharge Instructions  RECOMMENDATIONS MADE BY THE CONSULTANT AND ANY TEST RESULTS WILL BE SENT TO YOUR REFERRING PHYSICIAN.  EXAM FINDINGS BY THE PHYSICIAN TODAY AND SIGNS OR SYMPTOMS TO REPORT TO CLINIC OR PRIMARY PHYSICIAN: Exam and findings as discussed by Dr. Zigmund Daniel.  Will check some blood work today and if there is anything we need to do based upon your blood work we will call you.  MEDICATIONS PRESCRIBED:  none  INSTRUCTIONS/FOLLOW-UP: Return in 2 weeks to discuss results.  Thank you for choosing [facility] to provide your oncology and hematology care.  To afford each patient quality time with our providers, please arrive at least 15 minutes before your scheduled appointment time.  With your help, our goal is to use those 15 minutes to complete the necessary work-up to ensure our physicians have the information they need to help with your evaluation and healthcare recommendations.    Effective January 1st, 2014, we ask that you re-schedule your appointment with our physicians should you arrive 10 or more minutes late for your appointment.  We strive to give you quality time with our providers, and arriving late affects you and other patients whose appointments are after yours.    Again, thank you for choosing [facility].  Our hope is that these requests will decrease the amount of time that you wait before being seen by our physicians.       _____________________________________________________________  Should you have questions after your visit to [facility], please contact our office at [phone] between the hours of 8:30 a.m. and 5:00 p.m.  Voicemails left after 4:30 p.m. will not be returned until the following business day.  For prescription refill requests, have your pharmacy contact our office with your prescription refill request.

## 2013-06-04 NOTE — Progress Notes (Signed)
Kathy Dawson presented for labwork. Labs per MD order drawn via Peripheral Line 23 gauge needle inserted in right AC  Good blood return present. Procedure without incident.  Needle removed intact. Patient tolerated procedure well.   

## 2013-06-04 NOTE — Progress Notes (Signed)
[facility] NEW PATIENT EVALUATION   Name: Kathy Dawson Date: 06/04/2013 MRN: [id number] DOB: 1956-02-21  PCP: Kirk Ruths, MD   REFERRING PHYSICIAN: Karleen Hampshire, MD  REASON FOR REFERRAL: Microcytic anemia     HISTORY OF PRESENT ILLNESS:Kathy Dawson is a 57 y.o. female who is referred by her PCP for evaluation of microcytic anemia. She underwent knee surgery recently because of an infected joint and was seen by a hematologist at [facility] but no definitive diagnosis was made nor was an intervention recommended. The patient does not crave ice. She did have an episode of rectal bleeding within the last year and was evaluated by a gastroenterologist with findings of diverticulosis as well as internal hemorrhoids and a small polyp. She denies epistaxis, hemoptysis, hematuria, or vaginal bleeding. She also denies any dark urine, joint pain, easy satiety, lymphadenopathy, cough, wheezing, or significantly worsening fatigue. She does take proton pump inhibitors.   PAST MEDICAL HISTORY:  has a past medical history of GERD (gastroesophageal reflux disease) and Knee pain.     PAST SURGICAL HISTORY: Past Surgical History  Procedure Laterality Date  . Tubal ligation    . Appendectomy    . Knee surgery    . Bilateral foot surgery    . Colonoscopy  12/08/2011    Procedure: COLONOSCOPY;  Surgeon: Corbin Ade, MD;  [facility]: [facility];  Service: Endoscopy;  Laterality: N/A;  12:45     CURRENT MEDICATIONS: has a current medication list which includes the following prescription(s): calcium carbonate, vitamin d3, fesoterodine, hydrocodone-acetaminophen, naproxen sodium, omeprazole, OVER THE COUNTER MEDICATION, OVER THE COUNTER MEDICATION, betamethasone dipropionate, linaclotide, peg 3350 powder, and senna.   ALLERGIES: Prochlorperazine edisylate and Propoxyphene-acetaminophen   SOCIAL HISTORY:  reports that she has quit smoking. Her smoking use included  Cigarettes. She has a 5 pack-year smoking history. She quit smokeless tobacco use about 3 years ago. She reports that she does not drink alcohol or use illicit drugs.   FAMILY HISTORY: family history includes Colon polyps in her father. There is no history of Colon cancer.    REVIEW OF SYSTEMS:  Other than that discussed above is noncontributory.    PHYSICAL EXAM:  vitals were not taken for this visit.   GENERAL:alert, no distress and comfortable SKIN: skin color, texture, turgor are normal, no rashes or significant lesions EYES: normal, Conjunctiva are pink and non-injected, sclera clear. No significant pallor. OROPHARYNX:no exudate, no erythema and lips, buccal mucosa, and tongue normal  NECK: supple, thyroid normal size, non-tender, without nodularity CHEST: Normal AP diameter with no breast masses. LYMPH:  no palpable lymphadenopathy in the cervical, axillary or inguinal LUNGS: clear to auscultation and percussion with normal breathing effort HEART: regular rate & rhythm and no murmurs ABDOMEN:abdomen soft, non-tender and normal bowel sounds MUSCULOSKELETALl:no cyanosis of digits, no clubbing or edema. Mild decrease in right knee range of motion.  NEURO: alert & oriented x 3 with fluent speech, no focal motor/sensory deficits    LABORATORY DATA:  No visits with results within 30 Day(s) from this visit. Latest known visit with results is:  Admission on 12/01/2011, Discharged on 12/02/2011  Component Date Value Range Status  . WBC 12/02/2011 6.3  4.0 - 10.5 K/uL Final  . RBC 12/02/2011 4.21  3.87 - 5.11 MIL/uL Final  . Hemoglobin 12/02/2011 11.3* 12.0 - 15.0 g/dL Final  . HCT 14/78/2956 34.8* 36.0 - 46.0 % Final  . MCV 12/02/2011 82.7  78.0 - 100.0 fL Final  .  MCH 12/02/2011 26.8  26.0 - 34.0 pg Final  . MCHC 12/02/2011 32.5  30.0 - 36.0 g/dL Final  . RDW 16/05/9603 14.8  11.5 - 15.5 % Final  . Platelets 12/02/2011 260  150 - 400 K/uL Final  . aPTT 12/02/2011 47* 24 - 37  seconds Final   Comment:                                 IF BASELINE aPTT IS ELEVATED,                          SUGGEST PATIENT RISK ASSESSMENT                          BE USED TO DETERMINE APPROPRIATE                          ANTICOAGULANT THERAPY.  . Prothrombin Time 12/02/2011 14.4  11.6 - 15.2 seconds Final  . INR 12/02/2011 1.10  0.00 - 1.49 Final  . Sodium 12/02/2011 138  135 - 145 mEq/L Final  . Potassium 12/02/2011 3.9  3.5 - 5.1 mEq/L Final  . Chloride 12/02/2011 102  96 - 112 mEq/L Final  . CO2 12/02/2011 27  19 - 32 mEq/L Final  . Glucose, Bld 12/02/2011 97  70 - 99 mg/dL Final  . BUN 54/04/8118 17  6 - 23 mg/dL Final  . Creatinine, Ser 12/02/2011 0.66  0.50 - 1.10 mg/dL Final  . Calcium 14/78/2956 9.7  8.4 - 10.5 mg/dL Final  . GFR calc non Af Amer 12/02/2011 >90  >90 mL/min Final  . GFR calc Af Amer 12/02/2011 >90  >90 mL/min Final   Comment:                                 The eGFR has been calculated                          using the CKD EPI equation.                          This calculation has not been                          validated in all clinical                          situations.                          eGFR's persistently                          <90 mL/min signify                          possible Chronic Kidney Disease.  Marland Kitchen D-Dimer, Quant 12/02/2011 2.76* 0.00 - 0.48 ug/mL-FEU Final   Comment:  AT THE INHOUSE ESTABLISHED CUTOFF                          VALUE OF 0.48 ug/mL FEU,                          THIS ASSAY HAS BEEN DOCUMENTED                          IN THE LITERATURE TO HAVE                          A SENSITIVITY AND NEGATIVE                          PREDICTIVE VALUE OF AT LEAST                          98 TO 99%.  THE TEST RESULT                          SHOULD BE CORRELATED WITH                          AN ASSESSMENT OF THE CLINICAL                          PROBABILITY OF DVT / VTE.    Urinalysis No  results found for this basename: colorurine, appearanceur, labspec, phurine, glucoseu, hgbur, bilirubinur, ketonesur, proteinur, urobilinogen, nitrite, leukocytesur      @RADIOGRAPHY : No results found.  PATHOLOGY: Peripheral smear with hypochromic microcytic cells.   IMPRESSION: #1 Microcytic anemia with normal red cell count, highly suggestive of thalassemia trait, either alpha or beta. #2. Probable contribution from iron deficiency either on the basis of chronic blood loss or malabsorption of iron due to proton pump inhibitor therapy. #3. Status post right knee surgery, successfully being rehabilitated. #4. Gastroesophageal reflux disease #5. History of colon polyps, status post colonoscopic polypectomy. #6. History of diverticulosis and internal hemorrhoids with rectal bleeding in the past.   PLAN:  #1. Additional lab tests will be done today to corroborate the above diagnoses and to ensure that hemolysis is not occurring. With a slight elevation in thrombus I count, chronic blood loss is certainly within the differential. #2. The patient will be telephoned if additional lab tests are necessary prior to her followup visit in 2 weeks. #3. She was reassured that she probably does not have a primary bone marrow disorder but either has a mixed anemia due to contributions from a hemoglobinopathy and iron deficiency due to chronic blood loss or malabsorption.  I appreciate the opportunity of sharing in her care.   Maurilio Lovely, MD 06/04/2013 3:01 PM

## 2013-06-05 LAB — ANA: Anti Nuclear Antibody(ANA): NEGATIVE

## 2013-06-05 LAB — VITAMIN B12: Vitamin B-12: 543 pg/mL (ref 211–911)

## 2013-06-05 LAB — FOLATE: Folate: 11.6 ng/mL

## 2013-06-06 ENCOUNTER — Other Ambulatory Visit (HOSPITAL_COMMUNITY): Payer: BC Managed Care – PPO | Admitting: Hematology and Oncology

## 2013-06-06 ENCOUNTER — Other Ambulatory Visit (HOSPITAL_COMMUNITY): Payer: Self-pay

## 2013-06-06 ENCOUNTER — Telehealth (HOSPITAL_COMMUNITY): Payer: Self-pay

## 2013-06-06 DIAGNOSIS — D649 Anemia, unspecified: Secondary | ICD-10-CM

## 2013-06-06 DIAGNOSIS — D509 Iron deficiency anemia, unspecified: Secondary | ICD-10-CM

## 2013-06-06 LAB — HEMOGLOBINOPATHY EVALUATION
Hemoglobin Other: 0 %
Hgb A: 97.9 % — ABNORMAL HIGH (ref 96.8–97.8)
Hgb S Quant: 0 %

## 2013-06-06 NOTE — Telephone Encounter (Signed)
Requested patient to call clinic to schedule additional lab test before next visit on 06/18/13.

## 2013-06-07 NOTE — Telephone Encounter (Signed)
Appointment scheduled with patient for additional lab work.

## 2013-06-08 ENCOUNTER — Encounter (HOSPITAL_BASED_OUTPATIENT_CLINIC_OR_DEPARTMENT_OTHER): Payer: BC Managed Care – PPO

## 2013-06-08 DIAGNOSIS — D509 Iron deficiency anemia, unspecified: Secondary | ICD-10-CM

## 2013-06-08 NOTE — Progress Notes (Signed)
Labs drawn today for Alpha;Thalassemia Genotype

## 2013-06-09 DIAGNOSIS — D509 Iron deficiency anemia, unspecified: Secondary | ICD-10-CM | POA: Insufficient documentation

## 2013-06-09 HISTORY — DX: Iron deficiency anemia, unspecified: D50.9

## 2013-06-18 ENCOUNTER — Encounter (HOSPITAL_COMMUNITY): Payer: Self-pay

## 2013-06-18 ENCOUNTER — Encounter (HOSPITAL_COMMUNITY): Payer: BC Managed Care – PPO | Attending: Hematology and Oncology

## 2013-06-18 VITALS — BP 128/76 | HR 86 | Temp 97.6°F | Resp 20 | Wt 200.9 lb

## 2013-06-18 DIAGNOSIS — K219 Gastro-esophageal reflux disease without esophagitis: Secondary | ICD-10-CM

## 2013-06-18 DIAGNOSIS — D509 Iron deficiency anemia, unspecified: Secondary | ICD-10-CM

## 2013-06-18 MED ORDER — FOLIC ACID 1 MG PO TABS: 1.0000 mg | ORAL_TABLET | Freq: Every day | ORAL | Status: DC

## 2013-06-18 NOTE — Patient Instructions (Signed)
[facility] Discharge Instructions  RECOMMENDATIONS MADE BY THE CONSULTANT AND ANY TEST RESULTS WILL BE SENT TO YOUR REFERRING PHYSICIAN.  Iron infusion this Friday. Lab work again in 6 weeks then see MD again after lab work.  Thank you for choosing [facility] to provide your oncology and hematology care.  To afford each patient quality time with our providers, please arrive at least 15 minutes before your scheduled appointment time.  With your help, our goal is to use those 15 minutes to complete the necessary work-up to ensure our physicians have the information they need to help with your evaluation and healthcare recommendations.    Effective January 1st, 2014, we ask that you re-schedule your appointment with our physicians should you arrive 10 or more minutes late for your appointment.  We strive to give you quality time with our providers, and arriving late affects you and other patients whose appointments are after yours.    Again, thank you for choosing [facility].  Our hope is that these requests will decrease the amount of time that you wait before being seen by our physicians.       _____________________________________________________________  Should you have questions after your visit to [facility], please contact our office at [phone] between the hours of 8:30 a.m. and 5:00 p.m.  Voicemails left after 4:30 p.m. will not be returned until the following business day.  For prescription refill requests, have your pharmacy contact our office with your prescription refill request.

## 2013-06-18 NOTE — Progress Notes (Signed)
[facility]  OFFICE PROGRESS NOTE  Kirk Ruths, MD [address] [place] [address]  DIAGNOSIS: Anemia, iron deficiency - Plan: CBC with Differential  Chief Complaint  Patient presents with  . Anemia    iron def    CURRENT THERAPY: None  INTERVAL HISTORY: Kathy Dawson 57 y.o. female returns for followup of microcytic anemia.  She continues to feel fatigued but denies any rectal bleeding, melena, hematochezia, vaginal bleeding, epistaxis, or hemoptysis. Urine is normal color. She denies any worsening reflux symptoms. She also denies lower extremity swelling or redness, PND, orthopnea, palpitations, or headaches.  MEDICAL HISTORY: Past Medical History  Diagnosis Date  . GERD (gastroesophageal reflux disease)   . Knee pain   . Anemia     INTERIM HISTORY: has KNEE, ARTHRITIS, DEGEN./OSTEO; DERANGEMENT MENISCUS; KNEE PAIN; Rectal bleeding; Constipation; Difficulty in walking(719.7); Stiffness of right knee; Microcytic anemia; and Anemia, iron deficiency on her problem list.    ALLERGIES:  is allergic to prochlorperazine edisylate and propoxyphene-acetaminophen.  MEDICATIONS: has a current medication list which includes the following prescription(s): betamethasone dipropionate, calcium carbonate, vitamin d3, diphenhydramine, fesoterodine, hydrocodone-acetaminophen, linaclotide, naproxen sodium, omeprazole, OVER THE COUNTER MEDICATION, OVER THE COUNTER MEDICATION, peg 3350 powder, and senna.  SURGICAL HISTORY:  Past Surgical History  Procedure Laterality Date  . Tubal ligation    . Appendectomy    . Knee surgery    . Bilateral foot surgery    . Colonoscopy  12/08/2011    Procedure: COLONOSCOPY;  Surgeon: Corbin Ade, MD;  Location: [facility];  Service: Endoscopy;  Laterality: N/A;  12:45  . 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 There is no history of Colon cancer.  SOCIAL HISTORY:  reports that she has quit smoking. Her smoking use included Cigarettes. She has a 5 pack-year smoking history. She has never used smokeless tobacco. She reports that she does not drink alcohol or use illicit drugs.  REVIEW OF SYSTEMS:  Other than that discussed above is noncontributory.  PHYSICAL EXAMINATION: ECOG PERFORMANCE STATUS: 1 - Symptomatic but completely ambulatory  There were no vitals taken for this visit.  GENERAL:alert, no distress and comfortable SKIN: skin color, texture, turgor are normal, no rashes or significant lesions EYES: PERLA; Conjunctiva are pink and non-injected, sclera clear OROPHARYNX:no exudate, no erythema on lips, buccal mucosa, or tongue. NECK: supple, thyroid normal size, non-tender, without nodularity. No masses CHEST: Normal AP diameter with no breast masses. LYMPH:  no palpable lymphadenopathy in the cervical, axillary or inguinal LUNGS: clear to auscultation and percussion with normal breathing effort HEART: regular rate & rhythm and no murmurs. ABDOMEN:abdomen soft, non-tender and normal bowel sounds MUSCULOSKELETAL:no cyanosis of digits and no clubbing. Range of motion normal.  NEURO: alert & oriented x 3 with fluent speech, no focal motor/sensory deficits   LABORATORY DATA: Office Visit on 06/04/2013  Component Date Value Range Status  . WBC 06/04/2013 6.0  4.0 - 10.5 K/uL Final  . RBC 06/04/2013 4.36  3.87 - 5.11 MIL/uL Final  . Hemoglobin 06/04/2013 9.4* 12.0 - 15.0 g/dL Final  . HCT 40/98/1191 30.9* 36.0 - 46.0 % Final  . MCV 06/04/2013 70.9* 78.0 - 100.0 fL Final  . MCH 06/04/2013 21.6* 26.0 - 34.0 pg Final  . MCHC 06/04/2013 30.4  30.0 - 36.0 g/dL Final  .  RDW 06/04/2013 17.6* 11.5 - 15.5 % Final  . Platelets 06/04/2013 398  150 - 400 K/uL Final  . Neutrophils Relative % 06/04/2013 50  43 - 77 % Final  . Lymphocytes Relative 06/04/2013 36  12 - 46 %  Final  . Monocytes Relative 06/04/2013 6  3 - 12 % Final  . Eosinophils Relative 06/04/2013 8* 0 - 5 % Final  . Basophils Relative 06/04/2013 0  0 - 1 % Final  . Neutro Abs 06/04/2013 2.9  1.7 - 7.7 K/uL Final  . Lymphs Abs 06/04/2013 2.2  0.7 - 4.0 K/uL Final  . Monocytes Absolute 06/04/2013 0.4  0.1 - 1.0 K/uL Final  . Eosinophils Absolute 06/04/2013 0.5  0.0 - 0.7 K/uL Final  . Basophils Absolute 06/04/2013 0.0  0.0 - 0.1 K/uL Final  . Smear Review 06/04/2013 MORPHOLOGY UNREMARKABLE   Final  . Retic Ct Pct 06/04/2013 1.2  0.4 - 3.1 % Final  . RBC. 06/04/2013 4.36  3.87 - 5.11 MIL/uL Final  . Retic Count, Manual 06/04/2013 52.3  19.0 - 186.0 K/uL Final  . Sodium 06/04/2013 138  135 - 145 mEq/L Final  . Potassium 06/04/2013 3.8  3.5 - 5.1 mEq/L Final  . Chloride 06/04/2013 102  96 - 112 mEq/L Final  . CO2 06/04/2013 25  19 - 32 mEq/L Final  . Glucose, Bld 06/04/2013 78  70 - 99 mg/dL Final  . BUN 16/05/9603 22  6 - 23 mg/dL Final  . Creatinine, Ser 06/04/2013 0.80  0.50 - 1.10 mg/dL Final  . Calcium 54/04/8118 9.8  8.4 - 10.5 mg/dL Final  . Total Protein 06/04/2013 7.7  6.0 - 8.3 g/dL Final  . Albumin 14/78/2956 3.6  3.5 - 5.2 g/dL Final  . AST 21/30/8657 17  0 - 37 U/L Final  . ALT 06/04/2013 19  0 - 35 U/L Final  . Alkaline Phosphatase 06/04/2013 169* 39 - 117 U/L Final  . Total Bilirubin 06/04/2013 <0.1* 0.3 - 1.2 mg/dL Final  . GFR calc non Af Amer 06/04/2013 82* >90 mL/min Final  . GFR calc Af Amer 06/04/2013 >90  >90 mL/min Final   Comment: (NOTE)                          The eGFR has been calculated using the CKD EPI equation.                          This calculation has not been validated in all clinical situations.                          eGFR's persistently <90 mL/min signify possible Chronic Kidney                          Disease.  Marland Kitchen LDH 06/04/2013 157  94 - 250 U/L Final  . Erythropoietin 06/04/2013 46.0* 2.6 - 18.5 mIU/mL Final   Comment: (NOTE)                           Because of diurnal variations in Erythropoietin levels, it is                          important to collect samples at a consistent time of day.  Morning  samples collected between 7:30 AM and 12:00 noon are recommended.                          Performed at [facility]  . Iron 06/04/2013 15* 42 - 135 ug/dL Final  . TIBC 62/13/0865 400  250 - 470 ug/dL Final  . Saturation Ratios 06/04/2013 4* 20 - 55 % Final  . UIBC 06/04/2013 385  125 - 400 ug/dL Final   Performed at [facility]  . Ferritin 06/04/2013 15  10 - 291 ng/mL Final   Performed at [facility]  . Vitamin B-12 06/04/2013 543  211 - 911 pg/mL Final   Performed at [facility]  . Folate 06/04/2013 11.6   Final   Comment: (NOTE)                          Reference Ranges                                 Deficient:       0.4 - 3.3 ng/mL                                 Indeterminate:   3.4 - 5.4 ng/mL                                 Normal:              > 5.4 ng/mL                          Performed at [facility]  . Haptoglobin 06/04/2013 322* 45 - 215 mg/dL Final   Performed at [facility]  . DAT, complement 06/04/2013 NEG   Final  . DAT, IgG 06/04/2013    Final                   Value:NEG                         Performed at [facility]  . ANA 06/04/2013 NEGATIVE  NEGATIVE Final   Performed at [facility]  . Hgb A2 Quant 06/04/2013 2.1* 2.2 - 3.2 % Final  . Hgb F Quant 06/04/2013 0.0  0.0 - 2.0 % Final  . Hgb S Quant 06/04/2013 0.0  0.0 % Final  . Hgb A 06/04/2013 97.9* 96.8 - 97.8 % Final  . Hemoglobin Other 06/04/2013 0.0  0.0 % Final   Comment: (NOTE)                          Normal study.                          Reviewed by Nehemiah Massed Mammarappallil MD (Electronic Signature on File)                          Interpretation                          --------------  Performed at [facility]      PATHOLOGY:  Urinalysis No results found for this basename: colorurine, appearanceur, labspec, phurine, glucoseu, hgbur, bilirubinur, ketonesur, proteinur, urobilinogen, nitrite, leukocytesur    RADIOGRAPHIC STUDIES: No results found.  ASSESSMENT:  #1. Iron deficiency anemia with elevated red cell count, highly suspicious for alpha thalassemia trait with gene study still pending. #2. Gastroesophageal reflux disease, currently on proton pump inhibitors, contributing to iron deficiency by preventing absorption. #3. History of colon polyps, status post colonoscopic polypectomy, no evidence of disease. #4. History of diverticulosis and internal hemorrhoids with rectal bleeding in the past, stable.   PLAN:  #1. Feraheme 1020 mg intravenously when available. #2. Folic acid 1 mg daily. #3. Office visit 6 weeks with CBC, reticulocyte count, and ferritin.   All questions were answered. The patient knows to call the clinic with any problems, questions or concerns. We can certainly see the patient much sooner if necessary.   I spent 25 minutes counseling the patient face to face. The total time spent in the appointment was 30 minutes.    Maurilio Lovely, MD 06/18/2013 1:13 PM

## 2013-06-22 ENCOUNTER — Encounter (HOSPITAL_BASED_OUTPATIENT_CLINIC_OR_DEPARTMENT_OTHER): Payer: BC Managed Care – PPO

## 2013-06-22 VITALS — BP 118/73 | HR 68 | Temp 98.0°F | Resp 16

## 2013-06-22 DIAGNOSIS — D509 Iron deficiency anemia, unspecified: Secondary | ICD-10-CM

## 2013-06-22 MED ORDER — SODIUM CHLORIDE 0.9 % IV SOLN
Freq: Once | INTRAVENOUS | Status: AC
  Administered 2013-06-22: 10:00:00 via INTRAVENOUS

## 2013-06-22 MED ORDER — SODIUM CHLORIDE 0.9 % IV SOLN
1020.0000 mg | Freq: Once | INTRAVENOUS | Status: AC
  Administered 2013-06-22: 1020 mg via INTRAVENOUS
  Filled 2013-06-22: qty 34

## 2013-06-22 MED ORDER — SODIUM CHLORIDE 0.9 % IJ SOLN
10.0000 mL | INTRAMUSCULAR | Status: DC | PRN
  Administered 2013-06-22: 10 mL

## 2013-06-22 NOTE — Progress Notes (Signed)
IV placement to left arm. Good blood return.  Fereheme 120 mg given IV.  IV d/c post infusion.  Tolerated all well.

## 2013-07-12 LAB — MISCELLANEOUS TEST

## 2013-07-30 ENCOUNTER — Encounter (HOSPITAL_COMMUNITY): Payer: BC Managed Care – PPO | Attending: Hematology and Oncology

## 2013-07-30 DIAGNOSIS — D509 Iron deficiency anemia, unspecified: Secondary | ICD-10-CM | POA: Insufficient documentation

## 2013-07-30 LAB — RETICULOCYTES
RBC.: 4.66 MIL/uL (ref 3.87–5.11)
Retic Ct Pct: 0.9 % (ref 0.4–3.1)

## 2013-07-30 LAB — CBC WITH DIFFERENTIAL/PLATELET
Eosinophils Relative: 3 % (ref 0–5)
HCT: 36.7 % (ref 36.0–46.0)
Hemoglobin: 11.7 g/dL — ABNORMAL LOW (ref 12.0–15.0)
Lymphocytes Relative: 29 % (ref 12–46)
Lymphs Abs: 1.9 10*3/uL (ref 0.7–4.0)
MCV: 78.8 fL (ref 78.0–100.0)
Monocytes Absolute: 0.4 10*3/uL (ref 0.1–1.0)
Platelets: 311 10*3/uL (ref 150–400)
RBC: 4.66 MIL/uL (ref 3.87–5.11)
WBC: 6.5 10*3/uL (ref 4.0–10.5)

## 2013-07-30 LAB — FERRITIN: Ferritin: 106 ng/mL (ref 10–291)

## 2013-07-30 NOTE — Progress Notes (Addendum)
Labs drawn today for cbc,ferr,retic

## 2013-08-02 ENCOUNTER — Encounter (HOSPITAL_BASED_OUTPATIENT_CLINIC_OR_DEPARTMENT_OTHER): Payer: BC Managed Care – PPO

## 2013-08-02 VITALS — BP 119/70 | HR 78 | Temp 98.1°F | Resp 20 | Wt 198.9 lb

## 2013-08-02 DIAGNOSIS — D509 Iron deficiency anemia, unspecified: Secondary | ICD-10-CM

## 2013-08-02 DIAGNOSIS — D508 Other iron deficiency anemias: Secondary | ICD-10-CM

## 2013-08-02 DIAGNOSIS — K219 Gastro-esophageal reflux disease without esophagitis: Secondary | ICD-10-CM

## 2013-08-02 NOTE — Progress Notes (Signed)
[facility]  OFFICE PROGRESS NOTE  Kirk Ruths, MD [address] [place] [address]  DIAGNOSIS: Anemia, iron deficiency - Plan: Ferritin, ibuprofen (ADVIL,MOTRIN) 200 MG tablet, CBC with Differential, Ferritin  Chief Complaint  Patient presents with  . Anemia    Iron deficiency with elevated red cell count suggestive of thalassemia trait    CURRENT THERAPY: Intravenous Feraheme on 06/22/2013.   INTERVAL HISTORY: Kathy Dawson 57 y.o. female returns for followup of anemia having received intravenous iron on 06/22/2013 because of low ferritin. Red cell count was also found to be elevated in the face of anemia so evaluation for both alpha and beta thalassemia gene expression was undertaken. She has not experienced a dramatic change in her level of fatigue. Appetite is good with no nausea, vomiting, melena, hematochezia, hematuria, vaginal bleeding, epistaxis, or hemoptysis. Urine is normal color with no history of easy satiety.  MEDICAL HISTORY: Past Medical History  Diagnosis Date  . GERD (gastroesophageal reflux disease)   . Knee pain   . Anemia     INTERIM HISTORY: has KNEE, ARTHRITIS, DEGEN./OSTEO; DERANGEMENT MENISCUS; KNEE PAIN; Rectal bleeding; Constipation; Difficulty in walking(719.7); Stiffness of right knee; Microcytic anemia; and Anemia, iron deficiency on her problem list.    ALLERGIES:  is allergic to prochlorperazine; prochlorperazine edisylate; and propoxyphene-acetaminophen.  MEDICATIONS: has a current medication list which includes the following prescription(s): betamethasone dipropionate, calcium carbonate, vitamin d3, diphenhydramine, fesoterodine, hydrocodone-acetaminophen, ibuprofen, naproxen sodium, omeprazole, OVER THE COUNTER MEDICATION, folic acid, linaclotide, OVER THE COUNTER MEDICATION, and senna.  SURGICAL HISTORY:  Past Surgical History  Procedure Laterality Date  . Tubal  ligation    . Appendectomy    . Knee surgery    . Bilateral foot surgery    . Colonoscopy  12/08/2011    Procedure: COLONOSCOPY;  Surgeon: Corbin Ade, MD;  [facility];  Service: Endoscopy;  Laterality: N/A;  12:45  . 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 There is no history of Colon cancer.  SOCIAL HISTORY:  reports that she has quit smoking. Her smoking use included Cigarettes. She has a 5 pack-year smoking history. She has never used smokeless tobacco. She reports that she does not drink alcohol or use illicit drugs.  REVIEW OF SYSTEMS:  Other than that discussed above is noncontributory.  PHYSICAL EXAMINATION: ECOG PERFORMANCE STATUS: 1 - Symptomatic but completely ambulatory  Blood pressure 119/70, pulse 78, temperature 98.1 F (36.7 C), temperature source Oral, resp. rate 20, weight 198 lb 14.4 oz (90.22 kg).  GENERAL:alert, no distress and comfortable SKIN: skin color, texture, turgor are normal, no rashes or significant lesions EYES: PERLA; Conjunctiva are pink and non-injected, sclera clear OROPHARYNX:no exudate, no erythema on lips, buccal mucosa, or tongue. NECK: supple, thyroid normal size, non-tender, without nodularity. No masses CHEST: Normal AP diameter with no breast masses. LYMPH:  no palpable lymphadenopathy in the cervical, axillary or inguinal LUNGS: clear to auscultation and percussion with normal breathing effort HEART: regular rate & rhythm and no murmurs. ABDOMEN:abdomen soft, non-tender and normal bowel sounds MUSCULOSKELETAL:no cyanosis of digits and no clubbing. Range of motion normal.  NEURO: alert & oriented x 3 with fluent speech, no focal motor/sensory deficits   LABORATORY DATA: Infusion on 07/30/2013  Component Date Value Range Status  . Ferritin 07/30/2013 106  10 - 291  ng/mL Final   Performed at [facility]  . WBC 07/30/2013  6.5  4.0 - 10.5 K/uL Final  . RBC 07/30/2013 4.66  3.87 - 5.11 MIL/uL Final  . Hemoglobin 07/30/2013 11.7* 12.0 - 15.0 g/dL Final  . HCT 16/05/9603 36.7  36.0 - 46.0 % Final  . MCV 07/30/2013 78.8  78.0 - 100.0 fL Final  . MCH 07/30/2013 25.1* 26.0 - 34.0 pg Final  . MCHC 07/30/2013 31.9  30.0 - 36.0 g/dL Final  . RDW 54/04/8118 21.8* 11.5 - 15.5 % Final  . Platelets 07/30/2013 311  150 - 400 K/uL Final  . Neutrophils Relative % 07/30/2013 61  43 - 77 % Final  . Neutro Abs 07/30/2013 3.9  1.7 - 7.7 K/uL Final  . Lymphocytes Relative 07/30/2013 29  12 - 46 % Final  . Lymphs Abs 07/30/2013 1.9  0.7 - 4.0 K/uL Final  . Monocytes Relative 07/30/2013 7  3 - 12 % Final  . Monocytes Absolute 07/30/2013 0.4  0.1 - 1.0 K/uL Final  . Eosinophils Relative 07/30/2013 3  0 - 5 % Final  . Eosinophils Absolute 07/30/2013 0.2  0.0 - 0.7 K/uL Final  . Basophils Relative 07/30/2013 1  0 - 1 % Final  . Basophils Absolute 07/30/2013 0.0  0.0 - 0.1 K/uL Final  . Retic Ct Pct 07/30/2013 0.9  0.4 - 3.1 % Final  . RBC. 07/30/2013 4.66  3.87 - 5.11 MIL/uL Final  . Retic Count, Manual 07/30/2013 41.9  19.0 - 186.0 K/uL Final    PATHOLOGY: Peripheral blood urinalysis was negative for alpha thalassemia gene mutation as well as beta thalassemia gene mutation.  Urinalysis No results found for this basename: colorurine,  appearanceur,  labspec,  phurine,  glucoseu,  hgbur,  bilirubinur,  ketonesur,  proteinur,  urobilinogen,  nitrite,  leukocytesur    RADIOGRAPHIC STUDIES: No results found.  ASSESSMENT:  #1. Iron deficiency anemia, corrected with intravenous iron infusion with improvement of hemoglobin from 9.4-11.7. No evidence of thalassemia trait. #2. Gastroesophageal reflux disease, on long-term proton pump inhibitors contributing to iron deficiency by preventing absorption. #3. History of colon polyps, status post colonoscopic polypectomy, no evidence of disease. #4. History of diverticulosis and internal  hemorrhoids with rectal bleeding in the past, stable.   PLAN:  #1. Complete current prescription with folic acid but no need to refill since workup for thalassemia gene mutations was normal. #2. Followup in 6 months with CBC and ferritin. Patient was told to call should fatigued not improve.   All questions were answered. The patient knows to call the clinic with any problems, questions or concerns. We can certainly see the patient much sooner if necessary.   I spent 25 minutes counseling the patient face to face. The total time spent in the appointment was 30 minutes.    Maurilio Lovely, MD 08/02/2013 9:37 AM

## 2013-08-02 NOTE — Patient Instructions (Signed)
[facility] Discharge Instructions  RECOMMENDATIONS MADE BY THE CONSULTANT AND ANY TEST RESULTS WILL BE SENT TO YOUR REFERRING PHYSICIAN.  EXAM FINDINGS BY THE PHYSICIAN TODAY AND SIGNS OR SYMPTOMS TO REPORT TO CLINIC OR PRIMARY PHYSICIAN:  Labs in 6 months Return in 6 months to see Dr. Zigmund Daniel     Thank you for choosing [facility] to provide your oncology and hematology care.  To afford each patient quality time with our providers, please arrive at least 15 minutes before your scheduled appointment time.  With your help, our goal is to use those 15 minutes to complete the necessary work-up to ensure our physicians have the information they need to help with your evaluation and healthcare recommendations.    Effective January 1st, 2014, we ask that you re-schedule your appointment with our physicians should you arrive 10 or more minutes late for your appointment.  We strive to give you quality time with our providers, and arriving late affects you and other patients whose appointments are after yours.    Again, thank you for choosing [facility].  Our hope is that these requests will decrease the amount of time that you wait before being seen by our physicians.       _____________________________________________________________  Should you have questions after your visit to [facility], please contact our office at [phone] between the hours of 8:30 a.m. and 5:00 p.m.  Voicemails left after 4:30 p.m. will not be returned until the following business day.  For prescription refill requests, have your pharmacy contact our office with your prescription refill request.

## 2013-08-14 ENCOUNTER — Encounter: Payer: Self-pay | Admitting: Hematology and Oncology

## 2014-01-30 ENCOUNTER — Encounter (HOSPITAL_COMMUNITY): Payer: BC Managed Care – PPO | Attending: Hematology and Oncology

## 2014-01-30 DIAGNOSIS — Z96659 Presence of unspecified artificial knee joint: Secondary | ICD-10-CM | POA: Insufficient documentation

## 2014-01-30 DIAGNOSIS — K219 Gastro-esophageal reflux disease without esophagitis: Secondary | ICD-10-CM | POA: Insufficient documentation

## 2014-01-30 DIAGNOSIS — Z860100 Personal history of colon polyps, unspecified: Secondary | ICD-10-CM | POA: Insufficient documentation

## 2014-01-30 DIAGNOSIS — M25569 Pain in unspecified knee: Secondary | ICD-10-CM | POA: Insufficient documentation

## 2014-01-30 DIAGNOSIS — Z800 Family history of malignant neoplasm of digestive organs: Secondary | ICD-10-CM | POA: Insufficient documentation

## 2014-01-30 DIAGNOSIS — Z87891 Personal history of nicotine dependence: Secondary | ICD-10-CM | POA: Insufficient documentation

## 2014-01-30 DIAGNOSIS — D509 Iron deficiency anemia, unspecified: Secondary | ICD-10-CM | POA: Insufficient documentation

## 2014-01-30 DIAGNOSIS — E669 Obesity, unspecified: Secondary | ICD-10-CM | POA: Insufficient documentation

## 2014-01-30 DIAGNOSIS — Z8719 Personal history of other diseases of the digestive system: Secondary | ICD-10-CM | POA: Insufficient documentation

## 2014-01-30 LAB — CBC WITH DIFFERENTIAL/PLATELET
BASOS PCT: 0 % (ref 0–1)
Basophils Absolute: 0 10*3/uL (ref 0.0–0.1)
EOS ABS: 0.2 10*3/uL (ref 0.0–0.7)
Eosinophils Relative: 3 % (ref 0–5)
HCT: 39.3 % (ref 36.0–46.0)
Hemoglobin: 13.4 g/dL (ref 12.0–15.0)
Lymphocytes Relative: 38 % (ref 12–46)
Lymphs Abs: 2.1 10*3/uL (ref 0.7–4.0)
MCH: 29.7 pg (ref 26.0–34.0)
MCHC: 34.1 g/dL (ref 30.0–36.0)
MCV: 87.1 fL (ref 78.0–100.0)
Monocytes Absolute: 0.4 10*3/uL (ref 0.1–1.0)
Monocytes Relative: 8 % (ref 3–12)
NEUTROS PCT: 51 % (ref 43–77)
Neutro Abs: 2.9 10*3/uL (ref 1.7–7.7)
Platelets: 264 10*3/uL (ref 150–400)
RBC: 4.51 MIL/uL (ref 3.87–5.11)
RDW: 14 % (ref 11.5–15.5)
WBC: 5.6 10*3/uL (ref 4.0–10.5)

## 2014-01-30 LAB — FERRITIN: FERRITIN: 48 ng/mL (ref 10–291)

## 2014-01-30 NOTE — Progress Notes (Signed)
LABS DRAWN FOR CBC/DIFF, FERR.

## 2014-01-31 ENCOUNTER — Encounter (HOSPITAL_BASED_OUTPATIENT_CLINIC_OR_DEPARTMENT_OTHER): Payer: BC Managed Care – PPO

## 2014-01-31 ENCOUNTER — Encounter (HOSPITAL_COMMUNITY): Payer: Self-pay

## 2014-01-31 VITALS — BP 107/68 | HR 64 | Temp 98.2°F | Resp 18 | Wt 212.3 lb

## 2014-01-31 DIAGNOSIS — K219 Gastro-esophageal reflux disease without esophagitis: Secondary | ICD-10-CM

## 2014-01-31 DIAGNOSIS — D509 Iron deficiency anemia, unspecified: Secondary | ICD-10-CM

## 2014-01-31 DIAGNOSIS — Z86010 Personal history of colonic polyps: Secondary | ICD-10-CM

## 2014-01-31 NOTE — Progress Notes (Signed)
[place]  OFFICE PROGRESS NOTE  Leonides Grills, MD [place] [address]  DIAGNOSIS: Anemia, iron deficiency - Plan: CBC with Differential, Ferritin  Chief Complaint  Patient presents with  . Iron deficiency anemia    Feraheme 06/22/2013    CURRENT THERAPY: Intravenous Feraheme 06/22/2013  INTERVAL HISTORY: Kathy Dawson 58 y.o. female returns for followup of anemia having received intravenous iron on 06/22/2013 because of low ferritin with a negative workup for beta thalassemia trait.  She is on Prilosec for reflux symptoms. Appetite is good with good energy level. She denies any melena, hematochezia, hematuria, vaginal bleeding, epistaxis, or hemoptysis. She does have occasional nausea without hot flashes, persistent lower extremity swelling or redness, skin rash, joint pain, headache, or seizures. She has had her knee replaced and is functioning better ambulation-wise. She is concerned about the potential for hereditary stomach cancer since her father died of that disease.   MEDICAL HISTORY: Past Medical History  Diagnosis Date  . GERD (gastroesophageal reflux disease)   . Knee pain   . Anemia     INTERIM HISTORY: has KNEE, ARTHRITIS, DEGEN./OSTEO; DERANGEMENT MENISCUS; KNEE PAIN; Rectal bleeding; Constipation; Difficulty in walking(719.7); Stiffness of right knee; Microcytic anemia; and Anemia, iron deficiency on her problem list.    ALLERGIES:  is allergic to prochlorperazine; prochlorperazine edisylate; and propoxyphene n-acetaminophen.  MEDICATIONS: has a current medication list which includes the following prescription(s): betamethasone dipropionate, diphenhydramine, ibuprofen, omeprazole, OVER THE COUNTER MEDICATION, OVER THE COUNTER MEDICATION, calcium carbonate, vitamin d3, fesoterodine, folic acid, hydrocodone-acetaminophen, linaclotide, naproxen sodium, and senna.  SURGICAL HISTORY:  Past Surgical  History  Procedure Laterality Date  . Tubal ligation    . Appendectomy    . Knee surgery    . Bilateral foot surgery    . Colonoscopy  12/08/2011    Procedure: COLONOSCOPY;  Surgeon: Daneil Dolin, MD;  [facility];  Service: Endoscopy;  Laterality: N/A;  12:45  . 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 There is no history of Colon cancer.  SOCIAL HISTORY:  reports that she has quit smoking. Her smoking use included Cigarettes. She has a 5 pack-year smoking history. She has never used smokeless tobacco. She reports that she does not drink alcohol or use illicit drugs.  REVIEW OF SYSTEMS:  Other than that discussed above is noncontributory.  PHYSICAL EXAMINATION: ECOG PERFORMANCE STATUS: 1 - Symptomatic but completely ambulatory  There were no vitals taken for this visit.  GENERAL:alert, no distress and comfortable. Moderately obese. SKIN: skin color, texture, turgor are normal, no rashes or significant lesions EYES: PERLA; Conjunctiva are pink and non-injected, sclera clear SINUSES: No redness or tenderness over maxillary or ethmoid sinuses OROPHARYNX:no exudate, no erythema on lips, buccal mucosa, or tongue. NECK: supple, thyroid normal size, non-tender, without nodularity. No masses CHEST: Normal AP diameter with no breast masses. LYMPH:  no palpable lymphadenopathy in the cervical, axillary or inguinal LUNGS: clear to auscultation and percussion with normal breathing effort HEART: regular rate & rhythm and no murmurs. ABDOMEN:abdomen soft, non-tender and normal bowel sounds MUSCULOSKELETAL:no cyanosis of digits and no clubbing. Range of motion normal. Right knee surgical well-healed. NEURO: alert & oriented x 3 with fluent speech, no focal motor/sensory deficits   LABORATORY DATA: Lab on 01/30/2014  Component Date Value Ref Range Status  . WBC 01/30/2014 5.6  4.0 - 10.5  K/uL Final  . RBC 01/30/2014 4.51  3.87 - 5.11 MIL/uL Final  . Hemoglobin 01/30/2014 13.4  12.0 - 15.0 g/dL Final  . HCT 01/30/2014 39.3  36.0 - 46.0 % Final  . MCV 01/30/2014 87.1  78.0 - 100.0 fL Final  . MCH 01/30/2014 29.7  26.0 - 34.0 pg Final  . MCHC 01/30/2014 34.1  30.0 - 36.0 g/dL Final  . RDW 01/30/2014 14.0  11.5 - 15.5 % Final  . Platelets 01/30/2014 264  150 - 400 K/uL Final  . Neutrophils Relative % 01/30/2014 51  43 - 77 % Final  . Neutro Abs 01/30/2014 2.9  1.7 - 7.7 K/uL Final  . Lymphocytes Relative 01/30/2014 38  12 - 46 % Final  . Lymphs Abs 01/30/2014 2.1  0.7 - 4.0 K/uL Final  . Monocytes Relative 01/30/2014 8  3 - 12 % Final  . Monocytes Absolute 01/30/2014 0.4  0.1 - 1.0 K/uL Final  . Eosinophils Relative 01/30/2014 3  0 - 5 % Final  . Eosinophils Absolute 01/30/2014 0.2  0.0 - 0.7 K/uL Final  . Basophils Relative 01/30/2014 0  0 - 1 % Final  . Basophils Absolute 01/30/2014 0.0  0.0 - 0.1 K/uL Final  . Ferritin 01/30/2014 48  10 - 291 ng/mL Final   Performed at [facility]: No new pathology.  Urinalysis No results found for this basename: colorurine,  appearanceur,  labspec,  phurine,  glucoseu,  hgbur,  bilirubinur,  ketonesur,  proteinur,  urobilinogen,  nitrite,  leukocytesur    RADIOGRAPHIC STUDIES: No results found.  ASSESSMENT:  #1. Iron deficiency anemia, corrected with intravenous iron infusion with improvement of hemoglobin from 9.4-11.7. No evidence of beta thalassemia trait.  #2. Gastroesophageal reflux disease, on long-term proton pump inhibitors contributing to iron deficiency by preventing absorption.  #3. History of colon polyps, status post colonoscopic polypectomy, no evidence of disease.  #4. History of diverticulosis and internal hemorrhoids with rectal bleeding in the past, stable   PLAN:  #1. Patient is concerned about genetic stomach cancer and she was given information about CDH-1 gene mutations. #2. With  long-standing gastroesophageal reflux disease, have recommended she contact her gastroenterologist for upper endoscopy for baseline to ensure that changes of Barrett's esophagus or not present. #3. She is not interested in seeing a genetic counselor at this time but will read the information given to her in may change her mind in the future. #4. Followup in 6 months with CBC, ferritin.   All questions were answered. The patient knows to call the clinic with any problems, questions or concerns. We can certainly see the patient much sooner if necessary.   I spent 25 minutes counseling the patient face to face. The total time spent in the appointment was 30 minutes.    Doroteo Bradford, MD 01/31/2014 9:41 AM  DISCLAIMER:  This note was dictated with voice recognition software.  Similar sounding words can inadvertently be transcribed inaccurately and may not be corrected upon review.

## 2014-01-31 NOTE — Patient Instructions (Signed)
[place] Discharge Instructions  RECOMMENDATIONS MADE BY THE CONSULTANT AND ANY TEST RESULTS WILL BE SENT TO YOUR REFERRING PHYSICIAN.  EXAM FINDINGS BY THE PHYSICIAN TODAY AND SIGNS OR SYMPTOMS TO REPORT TO CLINIC OR PRIMARY PHYSICIAN: Exam and findings as discussed by Dr. Barnet Glasgow.  Recommends that you call Dr. Orvan Falconer office to get appointment for possible EGD.  Report increased fatigue, shortness of breath, etc.  MEDICATIONS PRESCRIBED:  none  INSTRUCTIONS/FOLLOW-UP: Follow-up in 3 months.  Thank you for choosing [place] to provide your oncology and hematology care.  To afford each patient quality time with our providers, please arrive at least 15 minutes before your scheduled appointment time.  With your help, our goal is to use those 15 minutes to complete the necessary work-up to ensure our physicians have the information they need to help with your evaluation and healthcare recommendations.    Effective January 1st, 2014, we ask that you re-schedule your appointment with our physicians should you arrive 10 or more minutes late for your appointment.  We strive to give you quality time with our providers, and arriving late affects you and other patients whose appointments are after yours.    Again, thank you for choosing [facility].  Our hope is that these requests will decrease the amount of time that you wait before being seen by our physicians.       _____________________________________________________________  Should you have questions after your visit to [facility], please contact our office at [phone] between the hours of 8:30 a.m. and 5:00 p.m.  Voicemails left after 4:30 p.m. will not be returned until the following business day.  For prescription refill requests, have your pharmacy contact our office with your prescription refill request.

## 2014-04-24 ENCOUNTER — Encounter (HOSPITAL_COMMUNITY): Payer: BC Managed Care – PPO | Attending: Hematology and Oncology

## 2014-04-24 DIAGNOSIS — Z800 Family history of malignant neoplasm of digestive organs: Secondary | ICD-10-CM | POA: Insufficient documentation

## 2014-04-24 DIAGNOSIS — M25569 Pain in unspecified knee: Secondary | ICD-10-CM | POA: Diagnosis not present

## 2014-04-24 DIAGNOSIS — Z8719 Personal history of other diseases of the digestive system: Secondary | ICD-10-CM | POA: Diagnosis not present

## 2014-04-24 DIAGNOSIS — E669 Obesity, unspecified: Secondary | ICD-10-CM | POA: Diagnosis not present

## 2014-04-24 DIAGNOSIS — Z860100 Personal history of colon polyps, unspecified: Secondary | ICD-10-CM | POA: Insufficient documentation

## 2014-04-24 DIAGNOSIS — K219 Gastro-esophageal reflux disease without esophagitis: Secondary | ICD-10-CM | POA: Insufficient documentation

## 2014-04-24 DIAGNOSIS — Z96659 Presence of unspecified artificial knee joint: Secondary | ICD-10-CM | POA: Diagnosis not present

## 2014-04-24 DIAGNOSIS — D509 Iron deficiency anemia, unspecified: Secondary | ICD-10-CM | POA: Insufficient documentation

## 2014-04-24 DIAGNOSIS — Z87891 Personal history of nicotine dependence: Secondary | ICD-10-CM | POA: Diagnosis not present

## 2014-04-24 LAB — CBC WITH DIFFERENTIAL/PLATELET
BASOS ABS: 0 10*3/uL (ref 0.0–0.1)
Basophils Relative: 0 % (ref 0–1)
EOS PCT: 3 % (ref 0–5)
Eosinophils Absolute: 0.2 10*3/uL (ref 0.0–0.7)
HCT: 39.3 % (ref 36.0–46.0)
Hemoglobin: 13.4 g/dL (ref 12.0–15.0)
Lymphocytes Relative: 35 % (ref 12–46)
Lymphs Abs: 2.1 10*3/uL (ref 0.7–4.0)
MCH: 30 pg (ref 26.0–34.0)
MCHC: 34.1 g/dL (ref 30.0–36.0)
MCV: 87.9 fL (ref 78.0–100.0)
Monocytes Absolute: 0.5 10*3/uL (ref 0.1–1.0)
Monocytes Relative: 9 % (ref 3–12)
NEUTROS ABS: 3.2 10*3/uL (ref 1.7–7.7)
Neutrophils Relative %: 53 % (ref 43–77)
PLATELETS: 304 10*3/uL (ref 150–400)
RBC: 4.47 MIL/uL (ref 3.87–5.11)
RDW: 13.3 % (ref 11.5–15.5)
WBC: 6 10*3/uL (ref 4.0–10.5)

## 2014-04-24 LAB — FERRITIN: Ferritin: 104 ng/mL (ref 10–291)

## 2014-04-24 NOTE — Progress Notes (Signed)
Labs for cbcd,ferr 

## 2014-04-25 ENCOUNTER — Ambulatory Visit (HOSPITAL_COMMUNITY): Payer: BC Managed Care – PPO

## 2014-05-20 ENCOUNTER — Encounter (HOSPITAL_COMMUNITY): Payer: Self-pay

## 2014-06-04 ENCOUNTER — Ambulatory Visit (HOSPITAL_COMMUNITY): Payer: BC Managed Care – PPO

## 2014-06-05 NOTE — Progress Notes (Signed)
This encounter was created in error - please disregard.

## 2014-06-28 ENCOUNTER — Encounter (HOSPITAL_COMMUNITY): Payer: BC Managed Care – PPO | Attending: Hematology and Oncology

## 2014-06-28 ENCOUNTER — Encounter (HOSPITAL_COMMUNITY): Payer: Self-pay

## 2014-06-28 VITALS — BP 127/80 | HR 68 | Temp 98.2°F | Resp 16 | Wt 211.8 lb

## 2014-06-28 DIAGNOSIS — K219 Gastro-esophageal reflux disease without esophagitis: Secondary | ICD-10-CM

## 2014-06-28 DIAGNOSIS — D509 Iron deficiency anemia, unspecified: Secondary | ICD-10-CM | POA: Insufficient documentation

## 2014-06-28 DIAGNOSIS — D473 Essential (hemorrhagic) thrombocythemia: Secondary | ICD-10-CM

## 2014-06-28 DIAGNOSIS — D75839 Thrombocytosis, unspecified: Secondary | ICD-10-CM

## 2014-06-28 LAB — CBC WITH DIFFERENTIAL/PLATELET
BASOS ABS: 0 10*3/uL (ref 0.0–0.1)
BASOS PCT: 1 % (ref 0–1)
EOS ABS: 0.2 10*3/uL (ref 0.0–0.7)
Eosinophils Relative: 3 % (ref 0–5)
HCT: 39.5 % (ref 36.0–46.0)
HEMOGLOBIN: 13.4 g/dL (ref 12.0–15.0)
Lymphocytes Relative: 37 % (ref 12–46)
Lymphs Abs: 2.4 10*3/uL (ref 0.7–4.0)
MCH: 30 pg (ref 26.0–34.0)
MCHC: 33.9 g/dL (ref 30.0–36.0)
MCV: 88.4 fL (ref 78.0–100.0)
Monocytes Absolute: 0.7 10*3/uL (ref 0.1–1.0)
Monocytes Relative: 10 % (ref 3–12)
NEUTROS PCT: 49 % (ref 43–77)
Neutro Abs: 3.2 10*3/uL (ref 1.7–7.7)
PLATELETS: 322 10*3/uL (ref 150–400)
RBC: 4.47 MIL/uL (ref 3.87–5.11)
RDW: 13 % (ref 11.5–15.5)
WBC: 6.5 10*3/uL (ref 4.0–10.5)

## 2014-06-28 LAB — FERRITIN: FERRITIN: 75 ng/mL (ref 10–291)

## 2014-06-28 NOTE — Progress Notes (Signed)
Kathy Dawson presented for labwork. Labs per MD order drawn via Peripheral Line 23 gauge needle inserted in right AC  Good blood return present. Procedure without incident.  Needle removed intact. Patient tolerated procedure well.

## 2014-06-28 NOTE — Patient Instructions (Signed)
[place] Discharge Instructions  RECOMMENDATIONS MADE BY THE CONSULTANT AND ANY TEST RESULTS WILL BE SENT TO YOUR REFERRING PHYSICIAN.  EXAM FINDINGS BY THE PHYSICIAN TODAY AND SIGNS OR SYMPTOMS TO REPORT TO CLINIC OR PRIMARY PHYSICIAN: Exam and findings as discussed by Dr. Barnet Glasgow.  If your ferritin level is low we will get you scheduled for iron infusions.  Report increased fatigue or shortness of breath. Contact your gastroenterologist regarding having a upper endoscopy done.    INSTRUCTIONS/FOLLOW-UP: Follow-up in 6 months with labs and office visit.  Thank you for choosing [facility] to provide your oncology and hematology care.  To afford each patient quality time with our providers, please arrive at least 15 minutes before your scheduled appointment time.  With your help, our goal is to use those 15 minutes to complete the necessary work-up to ensure our physicians have the information they need to help with your evaluation and healthcare recommendations.    Effective January 1st, 2014, we ask that you re-schedule your appointment with our physicians should you arrive 10 or more minutes late for your appointment.  We strive to give you quality time with our providers, and arriving late affects you and other patients whose appointments are after yours.    Again, thank you for choosing [facility].  Our hope is that these requests will decrease the amount of time that you wait before being seen by our physicians.       _____________________________________________________________  Should you have questions after your visit to [facility], please contact our office at [phone] between the hours of 8:30 a.m. and 4:30 p.m.  Voicemails left after 4:30 p.m. will not be returned until the following business day.  For prescription refill requests, have your pharmacy contact our office with your prescription refill request.     _______________________________________________________________  We hope that we have given you very good care.  You may receive a patient satisfaction survey in the mail, please complete it and return it as soon as possible.  We value your feedback!  _______________________________________________________________  Have you asked about our STAR program?  STAR stands for Survivorship Training and Rehabilitation, and this is a nationally recognized cancer care program that focuses on survivorship and rehabilitation.  Cancer and cancer treatments may cause problems, such as, pain, making you feel tired and keeping you from doing the things that you need or want to do. Cancer rehabilitation can help. Our goal is to reduce these troubling effects and help you have the best quality of life possible.  You may receive a survey from a nurse that asks questions about your current state of health.  Based on the survey results, all eligible patients will be referred to the [facility] program for an evaluation so we can better serve you!  A frequently asked questions sheet is available upon request.

## 2014-06-28 NOTE — Progress Notes (Signed)
[facility]  OFFICE PROGRESS NOTE  Leonides Grills, MD [place] [address]  DIAGNOSIS: Iron deficiency anemia - Plan: CBC with Differential, Ferritin  Thrombocytosis  Chief Complaint  Patient presents with  . Iron deficiency anemia    CURRENT THERAPY: Intravenous Feraheme 06/22/2013  INTERVAL HISTORY: Kathy Dawson 58 y.o. female returns for followup of anemia having received intravenous iron on 06/22/2013 because of low ferritin with a negative workup for beta thalassemia trait.  At the time of her last visit on 01/31/2014 she was given information about CDH-1G mutation increasing risk of stomach cancer and was recommended to contact her gastroenterologist for upper endoscopy because of long-standing gastroesophageal reflux disease. She had declined to see a Dietitian at that time. She did read information about predisposition to stomach cancer but declines to see a genetics counselor at this time. She does have occasional reflux symptoms which are controlled with Prilosec. Her main problem has been back pain left hip pain from degenerative joint disease, having received an injection in the left hip joint one week ago with relief. She denies any nausea, vomiting, diarrhea, constipation, melena, hematochezia, hematuria, lower extremity swelling or redness, craving for ice, skin rash, headache, or seizures.   MEDICAL HISTORY: Past Medical History  Diagnosis Date  . GERD (gastroesophageal reflux disease)   . Knee pain   . Anemia   . DJD (degenerative joint disease), lumbosacral     INTERIM HISTORY: has KNEE, ARTHRITIS, DEGEN./OSTEO; DERANGEMENT MENISCUS; KNEE PAIN; Rectal bleeding; Constipation; Difficulty in walking(719.7); Stiffness of right knee; Microcytic anemia; and Anemia, iron deficiency on her problem list.    ALLERGIES:  is allergic to prochlorperazine; prochlorperazine edisylate; and propoxyphene  n-acetaminophen.  MEDICATIONS: has a current medication list which includes the following prescription(s): aspirin-acetaminophen-caffeine, calcium carbonate, diphenhydramine, omeprazole, OVER THE COUNTER MEDICATION, OVER THE COUNTER MEDICATION, OVER THE COUNTER MEDICATION, senna, vitamin d3, fesoterodine, folic acid, and linaclotide.  SURGICAL HISTORY:  Past Surgical History  Procedure Laterality Date  . Tubal ligation    . Appendectomy    . Knee surgery    . Bilateral foot surgery    . Colonoscopy  12/08/2011    Procedure: COLONOSCOPY;  Surgeon: Daneil Dolin, MD;  Location: [facility];  Service: Endoscopy;  Laterality: N/A;  12:45  . 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 There is no history of Colon cancer.  SOCIAL HISTORY:  reports that she has quit smoking. Her smoking use included Cigarettes. She has a 5 pack-year smoking history. She has never used smokeless tobacco. She reports that she does not drink alcohol or use illicit drugs.  REVIEW OF SYSTEMS:  Other than that discussed above is noncontributory.  PHYSICAL EXAMINATION: ECOG PERFORMANCE STATUS: 1 - Symptomatic but completely ambulatory  Blood pressure 127/80, pulse 68, temperature 98.2 F (36.8 C), temperature source Oral, resp. rate 16, weight 211 lb 12.8 oz (96.072 kg), SpO2 99 %.  GENERAL:alert, no distress and comfortable SKIN: skin color, texture, turgor are normal, no rashes or significant lesions EYES: PERLA; Conjunctiva are pink and non-injected, sclera clear SINUSES: No redness or tenderness over maxillary or ethmoid sinuses OROPHARYNX:no exudate, no erythema on lips, buccal mucosa, or tongue. NECK: supple, thyroid normal size, non-tender, without nodularity. No masses CHEST: normal AP diameter with no breast masses. LYMPH:  no palpable lymphadenopathy in the cervical, axillary  or inguinal LUNGS: clear to  auscultation and percussion with normal breathing effort HEART: regular rate & rhythm and no murmurs. ABDOMEN:abdomen soft, non-tender and normal bowel sounds MUSCULOSKELETAL:no cyanosis of digits and no clubbing. Decreased range of motion of the left hip.  NEURO: alert & oriented x 3 with fluent speech, no focal motor/sensory deficits   LABORATORY DATA: Office Visit on 06/28/2014  Component Date Value Ref Range Status  . WBC 06/28/2014 6.5  4.0 - 10.5 K/uL Final  . RBC 06/28/2014 4.47  3.87 - 5.11 MIL/uL Final  . Hemoglobin 06/28/2014 13.4  12.0 - 15.0 g/dL Final  . HCT 06/28/2014 39.5  36.0 - 46.0 % Final  . MCV 06/28/2014 88.4  78.0 - 100.0 fL Final  . MCH 06/28/2014 30.0  26.0 - 34.0 pg Final  . MCHC 06/28/2014 33.9  30.0 - 36.0 g/dL Final  . RDW 06/28/2014 13.0  11.5 - 15.5 % Final  . Platelets 06/28/2014 322  150 - 400 K/uL Final  . Neutrophils Relative % 06/28/2014 49  43 - 77 % Final  . Neutro Abs 06/28/2014 3.2  1.7 - 7.7 K/uL Final  . Lymphocytes Relative 06/28/2014 37  12 - 46 % Final  . Lymphs Abs 06/28/2014 2.4  0.7 - 4.0 K/uL Final  . Monocytes Relative 06/28/2014 10  3 - 12 % Final  . Monocytes Absolute 06/28/2014 0.7  0.1 - 1.0 K/uL Final  . Eosinophils Relative 06/28/2014 3  0 - 5 % Final  . Eosinophils Absolute 06/28/2014 0.2  0.0 - 0.7 K/uL Final  . Basophils Relative 06/28/2014 1  0 - 1 % Final  . Basophils Absolute 06/28/2014 0.0  0.0 - 0.1 K/uL Final    PATHOLOGY:no new pathology.  Urinalysis No results found for: COLORURINE, APPEARANCEUR, LABSPEC, PHURINE, GLUCOSEU, HGBUR, BILIRUBINUR, KETONESUR, PROTEINUR, UROBILINOGEN, NITRITE, LEUKOCYTESUR  RADIOGRAPHIC STUDIES: No results found.  ASSESSMENT:  #1. Iron deficiency anemia, corrected with intravenous iron infusion with improvement of hemoglobin from 9.4-11.7. No evidence of beta thalassemia trait.  #2. Gastroesophageal reflux disease, on long-term proton pump inhibitors contributing to iron deficiency  by preventing absorption.  #3. History of colon polyps, status post colonoscopic polypectomy, no evidence of disease.  #4. History of diverticulosis and internal hemorrhoids with rectal bleeding in the past, stable. #5. Degenerative joint disease left hip.    PLAN:  #1. She was again encouraged to call her gastroenterologist for upper endoscopy to at least obtain a baseline. She was warned that premalignant lesions could occur with chronic reflux. #2. If ferritin is low, intravenous iron will be administered. #3. Follow-up in 6 months with CBC, ferritin.   All questions were answered. The patient knows to call the clinic with any problems, questions or concerns. We can certainly see the patient much sooner if necessary.   I spent 25 minutes counseling the patient face to face. The total time spent in the appointment was 30 minutes.    Doroteo Bradford, MD 06/28/2014 10:53 AM  DISCLAIMER:  This note was dictated with voice recognition software.  Similar sounding words can inadvertently be transcribed inaccurately and may not be corrected upon review.

## 2014-07-24 ENCOUNTER — Other Ambulatory Visit: Payer: Self-pay | Admitting: Orthopaedic Surgery

## 2014-07-24 DIAGNOSIS — M25551 Pain in right hip: Secondary | ICD-10-CM

## 2014-07-31 ENCOUNTER — Ambulatory Visit
Admission: RE | Admit: 2014-07-31 | Discharge: 2014-07-31 | Disposition: A | Discharge status: Home / Self Care | Payer: BC Managed Care – PPO | Source: Ambulatory Visit | Attending: Orthopaedic Surgery | Admitting: Orthopaedic Surgery

## 2014-07-31 DIAGNOSIS — M25551 Pain in right hip: Secondary | ICD-10-CM

## 2014-08-29 ENCOUNTER — Encounter: Payer: Self-pay | Admitting: Gastroenterology

## 2014-08-29 ENCOUNTER — Ambulatory Visit (INDEPENDENT_AMBULATORY_CARE_PROVIDER_SITE_OTHER): Payer: BLUE CROSS/BLUE SHIELD | Admitting: Gastroenterology

## 2014-08-29 ENCOUNTER — Other Ambulatory Visit: Payer: Self-pay

## 2014-08-29 VITALS — BP 143/84 | HR 74 | Temp 98.1°F | Ht 64.0 in | Wt 208.2 lb

## 2014-08-29 DIAGNOSIS — K219 Gastro-esophageal reflux disease without esophagitis: Secondary | ICD-10-CM

## 2014-08-29 DIAGNOSIS — R1314 Dysphagia, pharyngoesophageal phase: Secondary | ICD-10-CM | POA: Insufficient documentation

## 2014-08-29 DIAGNOSIS — K5900 Constipation, unspecified: Secondary | ICD-10-CM

## 2014-08-29 DIAGNOSIS — D126 Benign neoplasm of colon, unspecified: Secondary | ICD-10-CM

## 2014-08-29 MED ORDER — LUBIPROSTONE 8 MCG PO CAPS: 8.0000 ug | ORAL_CAPSULE | Freq: Two times a day (BID) | ORAL | Status: DC

## 2014-08-29 MED ORDER — PANTOPRAZOLE SODIUM 40 MG PO TBEC: 40.0000 mg | DELAYED_RELEASE_TABLET | Freq: Every day | ORAL | Status: DC

## 2014-08-29 NOTE — Assessment & Plan Note (Signed)
Colonoscopy up-to-date. Linzess too strong. Start Amitiza 8 mcg po BID; samples provided and prescription sent. 3 month follow-up.

## 2014-08-29 NOTE — Assessment & Plan Note (Signed)
Dilation as appropriate.  

## 2014-08-29 NOTE — Assessment & Plan Note (Signed)
59 year old female with history of chronic GERD, now worsening in the setting of increased weight and likely dietary behaviors. Associated dysphagia, early satiety, intermittent nausea with concern for underlying gastritis, PUD. Dysphagia could be secondary to esophagitis, web, ring, or stricture. As of note, she is concerned due to her father's history of stomach cancer, which he succumbed to. Stop Prilosec OTC and start Protonix once daily. Prescription provided.   Proceed with upper endoscopy and dilation in the near future with Dr. Gala Romney. The risks, benefits, and alternatives have been discussed in detail with patient. They have stated understanding and desire to proceed.  Switch to Protonix GERD diet handout

## 2014-08-29 NOTE — Progress Notes (Signed)
Referring Provider: Elsie Lincoln, MD Primary Care Physician:  Leonides Grills, MD Primary Gastroenterologist:  Dr. Gala Dawson   Chief Complaint  Patient presents with  . Nausea  . Constipation  . EGD    HPI:   Kathy Dawson is a 59 y.o. female presenting today at the request of Dr. Barnet Glasgow secondary to chronic GERD. She was seen by hematology last year due to IDA, requiring IV iron infusion with improvement of Hgb to normal. Ferritin stable. Colonoscopy on file from April 2013 with tubular adenoma. She is concerned as her father had a history of stomach cancer Curly Rim, prior patient of ours now deceased).    Severe indigestion. Gained significant amount of weight since knee surgery due to decreased mobility.  Feels depressed about it. Has early satiety, feels pressure in upper abdomen. Gagging with brushing teeth. Occasional solid food dysphagia, pointing to mid chest. Intermittent nausea, waxing and waning. Prilosec OTC, daily. No overt GI bleeding. Relafen occasionally.   Chronic constipation. Linzess gave her diarrhea. Used to be able to eat collard greens and have a BM, but now with no change in constipation. Nothing that she used to eat now works. Tried supplemental fiber without improvement.   Multiple areas of joint pain, pain in bottom of feet. Tearful.   Past Medical History  Diagnosis Date  . GERD (gastroesophageal reflux disease)   . Knee pain   . Anemia   . DJD (degenerative joint disease), lumbosacral     Past Surgical History  Procedure Laterality Date  . Tubal ligation    . Appendectomy    . Knee surgery    . Bilateral foot surgery    . Colonoscopy  12/08/2011    [id number] hemorrhoids/Sigmoid diverticulosis. Colonic polyp (tubular adenoma). Surveillance 2018  . 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 calcium carbonate (TUMS - DOSED IN MG ELEMENTAL CALCIUM) 500 MG  chewable tablet Chew 1 tablet by mouth daily. 750 mg    . Cholecalciferol (VITAMIN D3) 1000 UNITS CAPS Take 1 capsule by mouth daily.    . diclofenac (VOLTAREN) 75 MG EC tablet Take 75 mg by mouth 2 (two) times daily.    . diphenhydrAMINE (BENADRYL) 25 MG tablet Take 25 mg by mouth at bedtime as needed for sleep.    Marland Kitchen HYDROcodone-acetaminophen (NORCO/VICODIN) 5-325 MG per tablet   0  . omeprazole (PRILOSEC) 20 MG capsule Take 20 mg by mouth as needed.     Marland Kitchen OVER THE COUNTER MEDICATION Take 1 capsule by mouth daily. Zendocrine (cleanse for kidneys, liver and skin)    . OVER THE COUNTER MEDICATION Take 1 capsule by mouth daily. To aid with digestion/reflux and constipation    . OVER THE COUNTER MEDICATION Take 1 capsule by mouth daily. Deep Blue for swelling, pain and inflamation    . senna (SENOKOT) 8.6 MG TABS Take 1 tablet by mouth.    . lubiprostone (AMITIZA) 8 MCG capsule Take 1 capsule (8 mcg total) by mouth 2 (two) times daily with a meal. 60 capsule 3  . pantoprazole (PROTONIX) 40 MG tablet Take 1 tablet (40 mg total) by mouth daily. Take 30 minutes prior to breakfast 90 tablet 3   No current facility-administered medications for this visit.    Allergies as of 08/29/2014 - Review Complete 08/29/2014  Allergen Reaction Noted  . Prochlorperazine  06/18/2013  . Prochlorperazine edisylate    . Propoxyphene n-acetaminophen Itching  Family History  Problem Relation Age of Onset  . Colon polyps Father   . Cancer Father   . Colon cancer Neg Hx     History   Social History  . Marital Status: Married    Spouse Name: N/A    Number of Children: N/A  . Years of Education: N/A   Occupational History  . tobacco farmer    Social History Main Topics  . Smoking status: Former Smoker -- 0.50 packs/day for 10 years    Types: Cigarettes  . Smokeless tobacco: Former Systems developer    Quit date: 08/26/2006  . Alcohol Use: No  . Drug Use: No  . Sexual Activity: Not on file   Other Topics  Concern  . Not on file   Social History Narrative    Review of Systems: As mentioned in HPI  Physical Exam: BP 143/84 mmHg  Pulse 74  Temp(Src) 98.1 F (36.7 C) (Oral)  Ht 5\' 4"  (1.626 m)  Wt 208 lb 3.2 oz (94.439 kg)  BMI 35.72 kg/m2 General:   Alert and oriented. Well-developed, well-nourished, pleasant and cooperative. Tearful at times.  Head:  Normocephalic and atraumatic. Eyes:  Conjunctiva pink, sclera clear, no icterus.   Conjunctiva pink. Ears:  Normal auditory acuity. Nose:  No deformity, discharge,  or lesions. Mouth:  No deformity or lesions, mucosa pink and moist.  Lungs:  Clear to auscultation bilaterally, without wheezing, rales, or rhonchi.  Heart:  S1, S2 present without murmurs noted.  Abdomen:  +BS, soft, mild TTP epigastric region and non-distended. Without mass or HSM. No rebound or guarding. No hernias noted. Rectal:  Deferred  Msk:  Symmetrical without gross deformities. Normal posture. Extremities:  Without clubbing or edema. Neurologic:  Alert and  oriented x4;  grossly normal neurologically. Skin:  Intact, warm and dry without significant lesions or rashes Psych:  Alert and cooperative. Normal mood and affect.

## 2014-08-29 NOTE — Patient Instructions (Addendum)
Stop Prilosec. Start Protonix each morning, 30 minutes before breakfast.   Start taking Amitiza 85mcg (1 capsule) WITH food twice a day for constipation. I have provided samples and a prescription to your pharmacy. Let me know if this helps!  We have set you up for the the upper endoscopy with dilation with Dr. Gala Romney in the near future.   Please review the attached diet handout.   We will see you in 3 months  Gastroesophageal Reflux Disease, Adult Gastroesophageal reflux disease (GERD) happens when acid from your stomach flows up into the esophagus. When acid comes in contact with the esophagus, the acid causes soreness (inflammation) in the esophagus. Over time, GERD may create small holes (ulcers) in the lining of the esophagus. CAUSES   Increased body weight. This puts pressure on the stomach, making acid rise from the stomach into the esophagus.  Smoking. This increases acid production in the stomach.  Drinking alcohol. This causes decreased pressure in the lower esophageal sphincter (valve or ring of muscle between the esophagus and stomach), allowing acid from the stomach into the esophagus.  Late evening meals and a full stomach. This increases pressure and acid production in the stomach.  A malformed lower esophageal sphincter. Sometimes, no cause is found. SYMPTOMS   Burning pain in the lower part of the mid-chest behind the breastbone and in the mid-stomach area. This may occur twice a week or more often.  Trouble swallowing.  Sore throat.  Dry cough.  Asthma-like symptoms including chest tightness, shortness of breath, or wheezing. DIAGNOSIS  Your caregiver may be able to diagnose GERD based on your symptoms. In some cases, X-rays and other tests may be done to check for complications or to check the condition of your stomach and esophagus. TREATMENT  Your caregiver may recommend over-the-counter or prescription medicines to help decrease acid production. Ask your  caregiver before starting or adding any new medicines.  HOME CARE INSTRUCTIONS   Change the factors that you can control. Ask your caregiver for guidance concerning weight loss, quitting smoking, and alcohol consumption.  Avoid foods and drinks that make your symptoms worse, such as:  Caffeine or alcoholic drinks.  Chocolate.  Peppermint or mint flavorings.  Garlic and onions.  Spicy foods.  Citrus fruits, such as oranges, lemons, or limes.  Tomato-based foods such as sauce, chili, salsa, and pizza.  Fried and fatty foods.  Avoid lying down for the 3 hours prior to your bedtime or prior to taking a nap.  Eat small, frequent meals instead of large meals.  Wear loose-fitting clothing. Do not wear anything tight around your waist that causes pressure on your stomach.  Raise the head of your bed 6 to 8 inches with wood blocks to help you sleep. Extra pillows will not help.  Only take over-the-counter or prescription medicines for pain, discomfort, or fever as directed by your caregiver.  Do not take aspirin, ibuprofen, or other nonsteroidal anti-inflammatory drugs (NSAIDs). SEEK IMMEDIATE MEDICAL CARE IF:   You have pain in your arms, neck, jaw, teeth, or back.  Your pain increases or changes in intensity or duration.  You develop nausea, vomiting, or sweating (diaphoresis).  You develop shortness of breath, or you faint.  Your vomit is green, yellow, black, or looks like coffee grounds or blood.  Your stool is red, bloody, or black. These symptoms could be signs of other problems, such as heart disease, gastric bleeding, or esophageal bleeding. MAKE SURE YOU:   Understand these instructions.  Will watch your condition.  Will get help right away if you are not doing well or get worse. Document Released: 05/12/2005 Document Revised: 10/25/2011 Document Reviewed: 02/19/2011 [facility] Patient Information 2015 [place], [place]. This information is not intended to replace  advice given to you by your health care provider. Make sure you discuss any questions you have with your health care provider.

## 2014-08-29 NOTE — Assessment & Plan Note (Signed)
Surveillance 2018.

## 2014-09-04 NOTE — Progress Notes (Signed)
cc'ed to pcp °

## 2014-09-13 MED ORDER — SODIUM CHLORIDE 0.9 % IJ SOLN
INTRAMUSCULAR | Status: AC
  Filled 2014-09-13: qty 48

## 2014-09-16 ENCOUNTER — Encounter (HOSPITAL_COMMUNITY): Payer: Self-pay | Admitting: *Deleted

## 2014-09-16 ENCOUNTER — Encounter (HOSPITAL_COMMUNITY)
Admission: RE | Disposition: A | Discharge status: Home / Self Care | Payer: Self-pay | Source: Ambulatory Visit | Attending: Internal Medicine

## 2014-09-16 ENCOUNTER — Ambulatory Visit (HOSPITAL_COMMUNITY)
Admission: RE | Admit: 2014-09-16 | Discharge: 2014-09-16 | Disposition: A | Discharge status: Home / Self Care | Payer: BLUE CROSS/BLUE SHIELD | Source: Ambulatory Visit | Attending: Internal Medicine | Admitting: Internal Medicine

## 2014-09-16 DIAGNOSIS — R1319 Other dysphagia: Secondary | ICD-10-CM | POA: Insufficient documentation

## 2014-09-16 DIAGNOSIS — Z96651 Presence of right artificial knee joint: Secondary | ICD-10-CM | POA: Diagnosis not present

## 2014-09-16 DIAGNOSIS — Z888 Allergy status to other drugs, medicaments and biological substances status: Secondary | ICD-10-CM | POA: Insufficient documentation

## 2014-09-16 DIAGNOSIS — Z87891 Personal history of nicotine dependence: Secondary | ICD-10-CM | POA: Diagnosis not present

## 2014-09-16 DIAGNOSIS — D509 Iron deficiency anemia, unspecified: Secondary | ICD-10-CM | POA: Insufficient documentation

## 2014-09-16 DIAGNOSIS — Z79899 Other long term (current) drug therapy: Secondary | ICD-10-CM | POA: Insufficient documentation

## 2014-09-16 DIAGNOSIS — R1314 Dysphagia, pharyngoesophageal phase: Secondary | ICD-10-CM

## 2014-09-16 DIAGNOSIS — Z800 Family history of malignant neoplasm of digestive organs: Secondary | ICD-10-CM | POA: Diagnosis not present

## 2014-09-16 DIAGNOSIS — K269 Duodenal ulcer, unspecified as acute or chronic, without hemorrhage or perforation: Secondary | ICD-10-CM | POA: Insufficient documentation

## 2014-09-16 DIAGNOSIS — M479 Spondylosis, unspecified: Secondary | ICD-10-CM | POA: Diagnosis not present

## 2014-09-16 DIAGNOSIS — K219 Gastro-esophageal reflux disease without esophagitis: Secondary | ICD-10-CM | POA: Insufficient documentation

## 2014-09-16 HISTORY — PX: MALONEY DILATION: SHX5535

## 2014-09-16 HISTORY — PX: ESOPHAGOGASTRODUODENOSCOPY: SHX5428

## 2014-09-16 SURGERY — EGD (ESOPHAGOGASTRODUODENOSCOPY)
Anesthesia: Moderate Sedation

## 2014-09-16 MED ORDER — MIDAZOLAM HCL 5 MG/5ML IJ SOLN
INTRAMUSCULAR | Status: DC | PRN
  Administered 2014-09-16 (×2): 2 mg via INTRAVENOUS
  Administered 2014-09-16: 1 mg via INTRAVENOUS

## 2014-09-16 MED ORDER — STERILE WATER FOR IRRIGATION IR SOLN
Status: DC | PRN
  Administered 2014-09-16: 10:00:00

## 2014-09-16 MED ORDER — MIDAZOLAM HCL 5 MG/5ML IJ SOLN
INTRAMUSCULAR | Status: AC
  Filled 2014-09-16: qty 10

## 2014-09-16 MED ORDER — MEPERIDINE HCL 100 MG/ML IJ SOLN
INTRAMUSCULAR | Status: DC | PRN
Start: 2014-09-16 — End: 2014-09-16
  Administered 2014-09-16 (×2): 50 mg via INTRAVENOUS

## 2014-09-16 MED ORDER — LIDOCAINE VISCOUS 2 % MT SOLN
OROMUCOSAL | Status: DC | PRN
  Administered 2014-09-16: 3 mL via OROMUCOSAL

## 2014-09-16 MED ORDER — LIDOCAINE VISCOUS 2 % MT SOLN
OROMUCOSAL | Status: AC
  Filled 2014-09-16: qty 15

## 2014-09-16 MED ORDER — ONDANSETRON HCL 4 MG/2ML IJ SOLN
INTRAMUSCULAR | Status: AC
  Filled 2014-09-16: qty 2

## 2014-09-16 MED ORDER — ONDANSETRON HCL 4 MG/2ML IJ SOLN
INTRAMUSCULAR | Status: DC | PRN
  Administered 2014-09-16: 4 mg via INTRAVENOUS

## 2014-09-16 MED ORDER — MEPERIDINE HCL 100 MG/ML IJ SOLN
INTRAMUSCULAR | Status: AC
  Filled 2014-09-16: qty 2

## 2014-09-16 MED ORDER — SODIUM CHLORIDE 0.9 % IV SOLN
INTRAVENOUS | Status: DC
  Administered 2014-09-16: 10:00:00 via INTRAVENOUS

## 2014-09-16 NOTE — H&P (View-Only) (Signed)
Referring Provider: Elsie Lincoln, MD Primary Care Physician:  Leonides Grills, MD Primary Gastroenterologist:  Dr. Gala Romney   Chief Complaint  Patient presents with  . Nausea  . Constipation  . EGD    HPI:   Kathy Dawson is a 59 y.o. female presenting today at the request of Dr. Barnet Glasgow secondary to chronic GERD. She was seen by hematology last year due to IDA, requiring IV iron infusion with improvement of Hgb to normal. Ferritin stable. Colonoscopy on file from April 2013 with tubular adenoma. She is concerned as her father had a history of stomach cancer Curly Rim, prior patient of ours now deceased).    Severe indigestion. Gained significant amount of weight since knee surgery due to decreased mobility.  Feels depressed about it. Has early satiety, feels pressure in upper abdomen. Gagging with brushing teeth. Occasional solid food dysphagia, pointing to mid chest. Intermittent nausea, waxing and waning. Prilosec OTC, daily. No overt GI bleeding. Relafen occasionally.   Chronic constipation. Linzess gave her diarrhea. Used to be able to eat collard greens and have a BM, but now with no change in constipation. Nothing that she used to eat now works. Tried supplemental fiber without improvement.   Multiple areas of joint pain, pain in bottom of feet. Tearful.   Past Medical History  Diagnosis Date  . GERD (gastroesophageal reflux disease)   . Knee pain   . Anemia   . DJD (degenerative joint disease), lumbosacral     Past Surgical History  Procedure Laterality Date  . Tubal ligation    . Appendectomy    . Knee surgery    . Bilateral foot surgery    . Colonoscopy  12/08/2011    [id number] hemorrhoids/Sigmoid diverticulosis. Colonic polyp (tubular adenoma). Surveillance 2018  . 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 calcium carbonate (TUMS - DOSED IN MG ELEMENTAL CALCIUM) 500 MG  chewable tablet Chew 1 tablet by mouth daily. 750 mg    . Cholecalciferol (VITAMIN D3) 1000 UNITS CAPS Take 1 capsule by mouth daily.    . diclofenac (VOLTAREN) 75 MG EC tablet Take 75 mg by mouth 2 (two) times daily.    . diphenhydrAMINE (BENADRYL) 25 MG tablet Take 25 mg by mouth at bedtime as needed for sleep.    Marland Kitchen HYDROcodone-acetaminophen (NORCO/VICODIN) 5-325 MG per tablet   0  . omeprazole (PRILOSEC) 20 MG capsule Take 20 mg by mouth as needed.     Marland Kitchen OVER THE COUNTER MEDICATION Take 1 capsule by mouth daily. Zendocrine (cleanse for kidneys, liver and skin)    . OVER THE COUNTER MEDICATION Take 1 capsule by mouth daily. To aid with digestion/reflux and constipation    . OVER THE COUNTER MEDICATION Take 1 capsule by mouth daily. Deep Blue for swelling, pain and inflamation    . senna (SENOKOT) 8.6 MG TABS Take 1 tablet by mouth.    . lubiprostone (AMITIZA) 8 MCG capsule Take 1 capsule (8 mcg total) by mouth 2 (two) times daily with a meal. 60 capsule 3  . pantoprazole (PROTONIX) 40 MG tablet Take 1 tablet (40 mg total) by mouth daily. Take 30 minutes prior to breakfast 90 tablet 3   No current facility-administered medications for this visit.    Allergies as of 08/29/2014 - Review Complete 08/29/2014  Allergen Reaction Noted  . Prochlorperazine  06/18/2013  . Prochlorperazine edisylate    . Propoxyphene n-acetaminophen Itching  Family History  Problem Relation Age of Onset  . Colon polyps Father   . Cancer Father   . Colon cancer Neg Hx     History   Social History  . Marital Status: Married    Spouse Name: N/A    Number of Children: N/A  . Years of Education: N/A   Occupational History  . tobacco farmer    Social History Main Topics  . Smoking status: Former Smoker -- 0.50 packs/day for 10 years    Types: Cigarettes  . Smokeless tobacco: Former Systems developer    Quit date: 08/26/2006  . Alcohol Use: No  . Drug Use: No  . Sexual Activity: Not on file   Other Topics  Concern  . Not on file   Social History Narrative    Review of Systems: As mentioned in HPI  Physical Exam: BP 143/84 mmHg  Pulse 74  Temp(Src) 98.1 F (36.7 C) (Oral)  Ht 5\' 4"  (1.626 m)  Wt 208 lb 3.2 oz (94.439 kg)  BMI 35.72 kg/m2 General:   Alert and oriented. Well-developed, well-nourished, pleasant and cooperative. Tearful at times.  Head:  Normocephalic and atraumatic. Eyes:  Conjunctiva pink, sclera clear, no icterus.   Conjunctiva pink. Ears:  Normal auditory acuity. Nose:  No deformity, discharge,  or lesions. Mouth:  No deformity or lesions, mucosa pink and moist.  Lungs:  Clear to auscultation bilaterally, without wheezing, rales, or rhonchi.  Heart:  S1, S2 present without murmurs noted.  Abdomen:  +BS, soft, mild TTP epigastric region and non-distended. Without mass or HSM. No rebound or guarding. No hernias noted. Rectal:  Deferred  Msk:  Symmetrical without gross deformities. Normal posture. Extremities:  Without clubbing or edema. Neurologic:  Alert and  oriented x4;  grossly normal neurologically. Skin:  Intact, warm and dry without significant lesions or rashes Psych:  Alert and cooperative. Normal mood and affect.

## 2014-09-16 NOTE — Interval H&P Note (Signed)
History and Physical Interval Note:  09/16/2014 10:14 AM  Kathy Dawson  has presented today for surgery, with the diagnosis of GERD, Dysphagia  The various methods of treatment have been discussed with the patient and family. After consideration of risks, benefits and other options for treatment, the patient has consented to  Procedure(s) with comments: ESOPHAGOGASTRODUODENOSCOPY (EGD) (N/A) - 1030am SAVORY DILATION (N/A) MALONEY DILATION (N/A) as a surgical intervention .  The patient's history has been reviewed, patient examined, no change in status, stable for surgery.  I have reviewed the patient's chart and labs.  Questions were answered to the patient's satisfaction.     Kathy Dawson  No change. Has not started her Protonix yet. EGD with possible esophageal dilation, etc. per plan.The risks, benefits, limitations, alternatives and imponderables have been reviewed with the patient. Questions have been answered. All parties are agreeable.

## 2014-09-16 NOTE — Discharge Instructions (Addendum)
EGD Discharge instructions Please read the instructions outlined below and refer to this sheet in the next few weeks. These discharge instructions provide you with general information on caring for yourself after you leave the hospital. Your doctor may also give you specific instructions. While your treatment has been planned according to the most current medical practices available, unavoidable complications occasionally occur. If you have any problems or questions after discharge, please call your doctor. ACTIVITY  You may resume your regular activity but move at a slower pace for the next 24 hours.   Take frequent rest periods for the next 24 hours.   Walking will help expel (get rid of) the air and reduce the bloated feeling in your abdomen.   No driving for 24 hours (because of the anesthesia (medicine) used during the test).   You may shower.   Do not sign any important legal documents or operate any machinery for 24 hours (because of the anesthesia used during the test).  NUTRITION  Drink plenty of fluids.   You may resume your normal diet.   Begin with a light meal and progress to your normal diet.   Avoid alcoholic beverages for 24 hours or as instructed by your caregiver.  MEDICATIONS  You may resume your normal medications unless your caregiver tells you otherwise.  WHAT YOU CAN EXPECT TODAY  You may experience abdominal discomfort such as a feeling of fullness or gas pains.  FOLLOW-UP  Your doctor will discuss the results of your test with you.  SEEK IMMEDIATE MEDICAL ATTENTION IF ANY OF THE FOLLOWING OCCUR:  Excessive nausea (feeling sick to your stomach) and/or vomiting.   Severe abdominal pain and distention (swelling).   Trouble swallowing.   Temperature over 101 F (37.8 C).   Rectal bleeding or vomiting of blood.  Gastroesophageal Reflux Disease, Adult Gastroesophageal reflux disease (GERD) happens when acid from your stomach flows up into the  esophagus. When acid comes in contact with the esophagus, the acid causes soreness (inflammation) in the esophagus. Over time, GERD may create small holes (ulcers) in the lining of the esophagus. CAUSES   Increased body weight. This puts pressure on the stomach, making acid rise from the stomach into the esophagus.  Smoking. This increases acid production in the stomach.  Drinking alcohol. This causes decreased pressure in the lower esophageal sphincter (valve or ring of muscle between the esophagus and stomach), allowing acid from the stomach into the esophagus.  Late evening meals and a full stomach. This increases pressure and acid production in the stomach.  A malformed lower esophageal sphincter. Sometimes, no cause is found. SYMPTOMS   Burning pain in the lower part of the mid-chest behind the breastbone and in the mid-stomach area. This may occur twice a week or more often.  Trouble swallowing.  Sore throat.  Dry cough.  Asthma-like symptoms including chest tightness, shortness of breath, or wheezing. DIAGNOSIS  Your caregiver may be able to diagnose GERD based on your symptoms. In some cases, X-rays and other tests may be done to check for complications or to check the condition of your stomach and esophagus. TREATMENT  Your caregiver may recommend over-the-counter or prescription medicines to help decrease acid production. Ask your caregiver before starting or adding any new medicines.  HOME CARE INSTRUCTIONS   Change the factors that you can control. Ask your caregiver for guidance concerning weight loss, quitting smoking, and alcohol consumption.  Avoid foods and drinks that make your symptoms worse, such as:  Caffeine or alcoholic drinks.  Chocolate.  Peppermint or mint flavorings.  Garlic and onions.  Spicy foods.  Citrus fruits, such as oranges, lemons, or limes.  Tomato-based foods such as sauce, chili, salsa, and pizza.  Fried and fatty foods.  Avoid  lying down for the 3 hours prior to your bedtime or prior to taking a nap.  Eat small, frequent meals instead of large meals.  Wear loose-fitting clothing. Do not wear anything tight around your waist that causes pressure on your stomach.  Raise the head of your bed 6 to 8 inches with wood blocks to help you sleep. Extra pillows will not help.  Only take over-the-counter or prescription medicines for pain, discomfort, or fever as directed by your caregiver.  Do not take aspirin, ibuprofen, or other nonsteroidal anti-inflammatory drugs (NSAIDs). SEEK IMMEDIATE MEDICAL CARE IF:   You have pain in your arms, neck, jaw, teeth, or back.  Your pain increases or changes in intensity or duration.  You develop nausea, vomiting, or sweating (diaphoresis).  You develop shortness of breath, or you faint.  Your vomit is green, yellow, black, or looks like coffee grounds or blood.  Your stool is red, bloody, or black. These symptoms could be signs of other problems, such as heart disease, gastric bleeding, or esophageal bleeding. MAKE SURE YOU:   Understand these instructions.  Will watch your condition.  Will get help right away if you are not doing well or get worse. Document Released: 05/12/2005 Document Revised: 10/25/2011 Document Reviewed: 02/19/2011 [facility] Patient Information 2015 [place], [place]. This information is not intended to replace advice given to you by your health care provider. Make sure you discuss any questions you have with your health care provider.  Constipation Constipation is when a person has fewer than three bowel movements a week, has difficulty having a bowel movement, or has stools that are dry, hard, or larger than normal. As people grow older, constipation is more common. If you try to fix constipation with medicines that make you have a bowel movement (laxatives), the problem may get worse. Long-term laxative use may cause the muscles of the colon to become  weak. A low-fiber diet, not taking in enough fluids, and taking certain medicines may make constipation worse.  CAUSES   Certain medicines, such as antidepressants, pain medicine, iron supplements, antacids, and water pills.   Certain diseases, such as diabetes, irritable bowel syndrome (IBS), thyroid disease, or depression.   Not drinking enough water.   Not eating enough fiber-rich foods.   Stress or travel.   Lack of physical activity or exercise.   Ignoring the urge to have a bowel movement.   Using laxatives too much.  SIGNS AND SYMPTOMS   Having fewer than three bowel movements a week.   Straining to have a bowel movement.   Having stools that are hard, dry, or larger than normal.   Feeling full or bloated.   Pain in the lower abdomen.   Not feeling relief after having a bowel movement.  DIAGNOSIS  Your health care provider will take a medical history and perform a physical exam. Further testing may be done for severe constipation. Some tests may include:  A barium enema X-ray to examine your rectum, colon, and, sometimes, your small intestine.   A sigmoidoscopy to examine your lower colon.   A colonoscopy to examine your entire colon. TREATMENT  Treatment will depend on the severity of your constipation and what is causing it. Some dietary treatments include  drinking more fluids and eating more fiber-rich foods. Lifestyle treatments may include regular exercise. If these diet and lifestyle recommendations do not help, your health care provider may recommend taking over-the-counter laxative medicines to help you have bowel movements. Prescription medicines may be prescribed if over-the-counter medicines do not work.  HOME CARE INSTRUCTIONS   Eat foods that have a lot of fiber, such as fruits, vegetables, whole grains, and beans.  Limit foods high in fat and processed sugars, such as french fries, hamburgers, cookies, candies, and soda.   A fiber  supplement may be added to your diet if you cannot get enough fiber from foods.   Drink enough fluids to keep your urine clear or pale yellow.   Exercise regularly or as directed by your health care provider.   Go to the restroom when you have the urge to go. Do not hold it.   Only take over-the-counter or prescription medicines as directed by your health care provider. Do not take other medicines for constipation without talking to your health care provider first.  [place] IF:   You have bright red blood in your stool.   Your constipation lasts for more than 4 days or gets worse.   You have abdominal or rectal pain.   You have thin, pencil-like stools.   You have unexplained weight loss. MAKE SURE YOU:   Understand these instructions.  Will watch your condition.  Will get help right away if you are not doing well or get worse. Document Released: 04/30/2004 Document Revised: 08/07/2013 Document Reviewed: 05/14/2013 [facility] Patient Information 2015 [place], [place]. This information is not intended to replace advice given to you by your health care provider. Make sure you discuss any questions you have with your health care provider.     Begin Protonix 40 mg daily as previously recommended  GERD and constipation information provided  Continue Amitiza  Office visit with [place] in 3 months  Further recommendations to follow pending review of pathology report

## 2014-09-16 NOTE — Op Note (Signed)
[facility] [address] [place], [address]   ENDOSCOPY PROCEDURE REPORT  PATIENT: Kathy Dawson, Kathy Dawson  MR#: [id number] BIRTHDATE: 03/16/1956 , 35  yrs. old GENDER: female ENDOSCOPIST: R.  Garfield Cornea, MD FACP [facility] REFERRED BY:  Elsie Lincoln, M.D.  Farrel Gobble, MD PROCEDURE DATE:  09-18-2014 PROCEDURE:  EGD w/ biopsy and Maloney dilation of esophagus INDICATIONS:  Worsening GERD; esophageal dysphagia; iron deficiency anemia; family history of H.  pylori and gastric cancer. MEDICATIONS: Versed 5 mg IV and Demerol 100 mg IV in divided dose. Xylocaine gel orally.  Zofran 4 mg IV. ASA CLASS:      Class II  CONSENT: The risks, benefits, limitations, alternatives and imponderables have been discussed.  The potential for biopsy, esophogeal dilation, etc. have also been reviewed.  Questions have been answered.  All parties agreeable.  Please see the history and physical in the medical record for more information.  DESCRIPTION OF PROCEDURE: After the risks benefits and alternatives of the procedure were thoroughly explained, informed consent was obtained.  The EG-[id number] ([id number]) endoscope was introduced through the mouth and advanced to the second portion of the duodenum , limited by Without limitations. The instrument was slowly withdrawn as the mucosa was fully examined.    Normal-appearing tubular esophagus.  Stomach empty. Normal-appearing gastric mucosa.  Patent pylorus.  Single erosion in the second portion of the duodenum; otherwise the first and second as well as the third portion of the duodenum appear normal. Retroflexed views revealed no abnormalities. Scope was withdrawn. A 54 French Maloney dilator was passed followed insertion without difficulty or resistance. A look back revealed no apparent complication related this maneuver. Subsequently, biopsies of the gastric mucosa as multiple biopsies of the duodenal mucosa taken. The scope was then  withdrawn from the patient and the procedure completed.  COMPLICATIONS: There were no immediate complications.  ENDOSCOPIC IMPRESSION: Single duodenal erosion of doubtful clinical significance. Status post Venia Minks dilation of the esophagus. Status post gastric and duodenal biopsy.  RECOMMENDATIONS: Begin Protonix 40 mg daily as previously recommended. Continue Amitiza. Further recommendations to follow pending review of pathology report  REPEAT EXAM:  eSigned:  R. Garfield Cornea, MD Rosalita Chessman [facility] 09-18-14 11:02 AM    CC:  CPT CODES: ICD CODES:  The ICD and CPT codes recommended by this software are interpretations from the data that the clinical staff has captured with the software.  The verification of the translation of this report to the ICD and CPT codes and modifiers is the sole responsibility of the health care institution and practicing physician where this report was generated.  [employer] will not be held responsible for the validity of the ICD and CPT codes included on this report.  AMA assumes no liability for data contained or not contained herein. CPT is a Designer, television/film set of the [employer].

## 2014-09-17 ENCOUNTER — Encounter (HOSPITAL_COMMUNITY): Payer: Self-pay | Admitting: Internal Medicine

## 2014-09-19 ENCOUNTER — Telehealth: Payer: Self-pay

## 2014-09-19 NOTE — Telephone Encounter (Signed)
PA redone and faxed to the insurance company.

## 2014-09-19 NOTE — Telephone Encounter (Signed)
Per last visit note has tried supplemental fiber without relief. Please call the patient and ask if she's tried Miralax before and whether it has worked.

## 2014-09-19 NOTE — Telephone Encounter (Signed)
Tried to do a PA for [employer]. It was denied d/t the pt has only tried linzess. Pt must also try and fail either miralax or lactulose in order for amitiza to be covered.

## 2014-09-19 NOTE — Telephone Encounter (Signed)
Called pt- she has tried miralax. Will redo PA form.

## 2014-09-20 NOTE — Telephone Encounter (Signed)
PA has been approved until 08/15/2038. Pharmacy is aware. I called Kathy Dawson. Asked her to call me back and let me know if she would like a copay card.

## 2014-09-22 ENCOUNTER — Encounter: Payer: Self-pay | Admitting: Internal Medicine

## 2014-09-24 ENCOUNTER — Telehealth: Payer: Self-pay

## 2014-09-24 NOTE — Telephone Encounter (Signed)
Letter mailed to the pt. 

## 2014-09-24 NOTE — Telephone Encounter (Signed)
Per RMR-  Send letter to patient.  Send copy of letter with path to referring provider and PCP. Patient needs PrevPak or generic equivalent x 14 days--hold any acid suppression and/or statin therapy patient may be taking during treatment - then resume after treatment.

## 2014-09-26 MED ORDER — AMOXICILL-CLARITHRO-LANSOPRAZ PO MISC: Freq: Two times a day (BID) | ORAL | Status: DC

## 2014-09-26 NOTE — Telephone Encounter (Signed)
Pt is aware. She is aware to stop her protonix while on prevpac. She is not on any statins. rx has been sent to [facility].

## 2014-10-23 ENCOUNTER — Other Ambulatory Visit (HOSPITAL_COMMUNITY): Payer: Self-pay | Admitting: Orthopaedic Surgery

## 2014-10-24 ENCOUNTER — Encounter (HOSPITAL_COMMUNITY): Payer: Self-pay

## 2014-10-24 ENCOUNTER — Ambulatory Visit (HOSPITAL_COMMUNITY)
Admission: RE | Admit: 2014-10-24 | Discharge: 2014-10-24 | Disposition: A | Discharge status: Home / Self Care | Payer: BLUE CROSS/BLUE SHIELD | Source: Ambulatory Visit | Attending: Orthopaedic Surgery | Admitting: Orthopaedic Surgery

## 2014-10-24 ENCOUNTER — Encounter (HOSPITAL_COMMUNITY)
Admission: RE | Admit: 2014-10-24 | Discharge: 2014-10-24 | Disposition: A | Discharge status: Home / Self Care | Payer: BLUE CROSS/BLUE SHIELD | Source: Ambulatory Visit | Attending: Orthopaedic Surgery | Admitting: Orthopaedic Surgery

## 2014-10-24 DIAGNOSIS — Z01818 Encounter for other preprocedural examination: Secondary | ICD-10-CM | POA: Diagnosis present

## 2014-10-24 DIAGNOSIS — M169 Osteoarthritis of hip, unspecified: Secondary | ICD-10-CM

## 2014-10-24 DIAGNOSIS — M1611 Unilateral primary osteoarthritis, right hip: Secondary | ICD-10-CM | POA: Insufficient documentation

## 2014-10-24 HISTORY — DX: Adverse effect of unspecified anesthetic, initial encounter: T41.45XA

## 2014-10-24 HISTORY — DX: Other complications of anesthesia, initial encounter: T88.59XA

## 2014-10-24 LAB — SURGICAL PCR SCREEN
MRSA, PCR: NEGATIVE
Staphylococcus aureus: NEGATIVE

## 2014-10-24 LAB — CBC
HCT: 38.7 % (ref 36.0–46.0)
HEMOGLOBIN: 12.8 g/dL (ref 12.0–15.0)
MCH: 28.4 pg (ref 26.0–34.0)
MCHC: 33.1 g/dL (ref 30.0–36.0)
MCV: 85.8 fL (ref 78.0–100.0)
Platelets: 301 10*3/uL (ref 150–400)
RBC: 4.51 MIL/uL (ref 3.87–5.11)
RDW: 13.4 % (ref 11.5–15.5)
WBC: 6.5 10*3/uL (ref 4.0–10.5)

## 2014-10-24 LAB — COMPREHENSIVE METABOLIC PANEL
ALK PHOS: 129 U/L — AB (ref 39–117)
ALT: 19 U/L (ref 0–35)
ANION GAP: 11 (ref 5–15)
AST: 14 U/L (ref 0–37)
Albumin: 3.5 g/dL (ref 3.5–5.2)
BUN: 11 mg/dL (ref 6–23)
CO2: 22 mmol/L (ref 19–32)
Calcium: 9.1 mg/dL (ref 8.4–10.5)
Chloride: 105 mmol/L (ref 96–112)
Creatinine, Ser: 0.59 mg/dL (ref 0.50–1.10)
GFR calc Af Amer: >90 mL/min (ref >90)
GLUCOSE: 78 mg/dL (ref 70–99)
Potassium: 3.6 mmol/L (ref 3.5–5.1)
Sodium: 138 mmol/L (ref 135–145)
Total Bilirubin: 0.5 mg/dL (ref 0.3–1.2)
Total Protein: 7.4 g/dL (ref 6.0–8.3)

## 2014-10-24 LAB — PROTIME-INR
INR: 1.09 (ref 0.00–1.49)
Prothrombin Time: 14.2 seconds (ref 11.6–15.2)

## 2014-10-24 NOTE — [place] Notes (Signed)
Pt. Will watch Emmi video at pre-admission.  Pt. States she had stress test 20 plus years ago due to having chest pain. Statd test was negative and they did mri of neck and found 3 bulging disc. Pt. Declined surgery.

## 2014-10-24 NOTE — Pre-Procedure Instructions (Signed)
Kathy Dawson  10/24/2014   Your procedure is scheduled on:  Monday, March 21  Report to [facility] Main Entrance "A" at 10:30 AM.  Call this number if you have problems the morning of surgery: [phone]               Any questions prior to surgery call pre-admissions [phone] (Monday-Friday 8:00am-4:00pm)   Remember:   Do not eat food or drink liquids after midnight.   Take these medicines the morning of surgery with A SIP OF WATER: tylenol or hydrocodone if needed, protonix              One week prior to surgery stop NSAID: aleve, advil, ibuprofen.     Do not wear jewelry, make-up or nail polish.  Do not wear lotions, powders, or perfumes. You may wear deodorant.  Do not shave 48 hours prior to surgery. Men may shave face and neck.  Do not bring valuables to the hospital.  [facility] is not responsible  for any belongings or valuables.               Contacts, dentures or bridgework may not be worn into surgery.  Leave suitcase in the car. After surgery it may be brought to your room.  For patients admitted to the hospital, discharge time is determined by your                treatment team.               Patients discharged the day of surgery will not be allowed to drive  home.  Name and phone number of your driver:   Special Instructions: review preparing for surgery handout   Please read over the following fact sheets that you were given: Pain Booklet, Coughing and Deep Breathing, MRSA Information and Surgical Site Infection Prevention

## 2014-10-25 LAB — URINALYSIS, ROUTINE W REFLEX MICROSCOPIC
BILIRUBIN URINE: NEGATIVE
Glucose, UA: NEGATIVE mg/dL
Hgb urine dipstick: NEGATIVE
KETONES UR: NEGATIVE mg/dL
Leukocytes, UA: NEGATIVE
Nitrite: NEGATIVE
Protein, ur: NEGATIVE mg/dL
SPECIFIC GRAVITY, URINE: 1.019 (ref 1.005–1.030)
UROBILINOGEN UA: 0.2 mg/dL (ref 0.0–1.0)
pH: 5.5 (ref 5.0–8.0)

## 2014-11-03 MED ORDER — CEFAZOLIN SODIUM-DEXTROSE 2-3 GM-% IV SOLR: 2.0000 g | INTRAVENOUS | Status: DC

## 2014-11-04 ENCOUNTER — Encounter (HOSPITAL_COMMUNITY)
Admission: RE | Disposition: A | Discharge status: Home / Self Care | Payer: Self-pay | Source: Ambulatory Visit | Attending: Orthopaedic Surgery

## 2014-11-04 ENCOUNTER — Inpatient Hospital Stay (HOSPITAL_COMMUNITY): Payer: BLUE CROSS/BLUE SHIELD

## 2014-11-04 ENCOUNTER — Inpatient Hospital Stay (HOSPITAL_COMMUNITY)
Admission: RE | Admit: 2014-11-04 | Discharge: 2014-11-06 | DRG: 470 | Disposition: A | Discharge status: Home / Self Care | Payer: BLUE CROSS/BLUE SHIELD | Source: Ambulatory Visit | Attending: Orthopaedic Surgery | Admitting: Orthopaedic Surgery

## 2014-11-04 ENCOUNTER — Inpatient Hospital Stay (HOSPITAL_COMMUNITY): Payer: BLUE CROSS/BLUE SHIELD | Admitting: Certified Registered Nurse Anesthetist

## 2014-11-04 ENCOUNTER — Encounter (HOSPITAL_COMMUNITY): Payer: Self-pay | Admitting: Certified Registered Nurse Anesthetist

## 2014-11-04 DIAGNOSIS — K219 Gastro-esophageal reflux disease without esophagitis: Secondary | ICD-10-CM | POA: Diagnosis present

## 2014-11-04 DIAGNOSIS — Z79899 Other long term (current) drug therapy: Secondary | ICD-10-CM | POA: Diagnosis not present

## 2014-11-04 DIAGNOSIS — Z7982 Long term (current) use of aspirin: Secondary | ICD-10-CM | POA: Diagnosis not present

## 2014-11-04 DIAGNOSIS — Z87891 Personal history of nicotine dependence: Secondary | ICD-10-CM

## 2014-11-04 DIAGNOSIS — Z419 Encounter for procedure for purposes other than remedying health state, unspecified: Secondary | ICD-10-CM

## 2014-11-04 DIAGNOSIS — D62 Acute posthemorrhagic anemia: Secondary | ICD-10-CM | POA: Diagnosis not present

## 2014-11-04 DIAGNOSIS — M25551 Pain in right hip: Secondary | ICD-10-CM | POA: Diagnosis present

## 2014-11-04 DIAGNOSIS — M85451 Solitary bone cyst, right pelvis: Secondary | ICD-10-CM | POA: Diagnosis present

## 2014-11-04 DIAGNOSIS — Z96651 Presence of right artificial knee joint: Secondary | ICD-10-CM | POA: Diagnosis present

## 2014-11-04 DIAGNOSIS — Z96649 Presence of unspecified artificial hip joint: Secondary | ICD-10-CM

## 2014-11-04 DIAGNOSIS — K5900 Constipation, unspecified: Secondary | ICD-10-CM | POA: Diagnosis not present

## 2014-11-04 DIAGNOSIS — M1611 Unilateral primary osteoarthritis, right hip: Principal | ICD-10-CM | POA: Diagnosis present

## 2014-11-04 DIAGNOSIS — G8929 Other chronic pain: Secondary | ICD-10-CM | POA: Diagnosis present

## 2014-11-04 HISTORY — PX: TOTAL HIP ARTHROPLASTY: SHX124

## 2014-11-04 SURGERY — ARTHROPLASTY, HIP, TOTAL, ANTERIOR APPROACH
Anesthesia: General | Laterality: Right

## 2014-11-04 MED ORDER — FENTANYL CITRATE 0.05 MG/ML IJ SOLN
INTRAMUSCULAR | Status: DC | PRN
  Administered 2014-11-04: 50 ug via INTRAVENOUS
  Administered 2014-11-04 (×3): 100 ug via INTRAVENOUS
  Administered 2014-11-04: 50 ug via INTRAVENOUS
  Administered 2014-11-04 (×2): 100 ug via INTRAVENOUS

## 2014-11-04 MED ORDER — DOCUSATE SODIUM 100 MG PO CAPS
100.0000 mg | ORAL_CAPSULE | Freq: Two times a day (BID) | ORAL | Status: DC
  Administered 2014-11-04 – 2014-11-06 (×4): 100 mg via ORAL
  Filled 2014-11-04 (×3): qty 1

## 2014-11-04 MED ORDER — GLYCOPYRROLATE 0.2 MG/ML IJ SOLN
INTRAMUSCULAR | Status: AC
  Filled 2014-11-04: qty 3

## 2014-11-04 MED ORDER — ONDANSETRON HCL 4 MG/2ML IJ SOLN
INTRAMUSCULAR | Status: AC
  Filled 2014-11-04: qty 4

## 2014-11-04 MED ORDER — POTASSIUM CHLORIDE IN NACL 20-0.45 MEQ/L-% IV SOLN
INTRAVENOUS | Status: DC
  Administered 2014-11-04 – 2014-11-05 (×2): via INTRAVENOUS
  Filled 2014-11-04 (×6): qty 1000

## 2014-11-04 MED ORDER — KETOROLAC TROMETHAMINE 30 MG/ML IJ SOLN
30.0000 mg | Freq: Once | INTRAMUSCULAR | Status: AC
  Administered 2014-11-04: 30 mg via INTRAVENOUS

## 2014-11-04 MED ORDER — METOCLOPRAMIDE HCL 5 MG/ML IJ SOLN: 5.0000 mg | Freq: Three times a day (TID) | INTRAMUSCULAR | Status: DC | PRN

## 2014-11-04 MED ORDER — NEOSTIGMINE METHYLSULFATE 10 MG/10ML IV SOLN
INTRAVENOUS | Status: DC | PRN
  Administered 2014-11-04: 4 mg via INTRAVENOUS

## 2014-11-04 MED ORDER — HYDROMORPHONE HCL 1 MG/ML IJ SOLN
INTRAMUSCULAR | Status: AC
  Filled 2014-11-04: qty 1

## 2014-11-04 MED ORDER — ALBUMIN HUMAN 5 % IV SOLN
INTRAVENOUS | Status: DC | PRN
  Administered 2014-11-04: 14:00:00 via INTRAVENOUS

## 2014-11-04 MED ORDER — ASPIRIN EC 325 MG PO TBEC
325.0000 mg | DELAYED_RELEASE_TABLET | Freq: Every day | ORAL | Status: DC
  Administered 2014-11-05 – 2014-11-06 (×2): 325 mg via ORAL
  Filled 2014-11-04 (×2): qty 1

## 2014-11-04 MED ORDER — OXYCODONE HCL 5 MG PO TABS
5.0000 mg | ORAL_TABLET | ORAL | Status: DC | PRN
  Administered 2014-11-04 – 2014-11-06 (×9): 10 mg via ORAL
  Filled 2014-11-04 (×9): qty 2

## 2014-11-04 MED ORDER — PHENYLEPHRINE 40 MCG/ML (10ML) SYRINGE FOR IV PUSH (FOR BLOOD PRESSURE SUPPORT)
PREFILLED_SYRINGE | INTRAVENOUS | Status: AC
  Filled 2014-11-04: qty 10

## 2014-11-04 MED ORDER — METHOCARBAMOL 1000 MG/10ML IJ SOLN
500.0000 mg | Freq: Four times a day (QID) | INTRAVENOUS | Status: DC | PRN
  Filled 2014-11-04: qty 5

## 2014-11-04 MED ORDER — CHLORHEXIDINE GLUCONATE 4 % EX LIQD: 60.0000 mL | Freq: Once | CUTANEOUS | Status: DC

## 2014-11-04 MED ORDER — KETOROLAC TROMETHAMINE 30 MG/ML IJ SOLN
INTRAMUSCULAR | Status: AC
  Filled 2014-11-04: qty 1

## 2014-11-04 MED ORDER — MENTHOL 3 MG MT LOZG: 1.0000 | LOZENGE | OROMUCOSAL | Status: DC | PRN

## 2014-11-04 MED ORDER — PROPOFOL 10 MG/ML IV BOLUS
INTRAVENOUS | Status: DC | PRN
  Administered 2014-11-04: 170 mg via INTRAVENOUS

## 2014-11-04 MED ORDER — METOCLOPRAMIDE HCL 10 MG PO TABS: 5.0000 mg | ORAL_TABLET | Freq: Three times a day (TID) | ORAL | Status: DC | PRN

## 2014-11-04 MED ORDER — 0.9 % SODIUM CHLORIDE (POUR BTL) OPTIME
TOPICAL | Status: DC | PRN
  Administered 2014-11-04: 1000 mL

## 2014-11-04 MED ORDER — LACTATED RINGERS IV SOLN
INTRAVENOUS | Status: DC | PRN
  Administered 2014-11-04 (×2): via INTRAVENOUS

## 2014-11-04 MED ORDER — ONDANSETRON HCL 4 MG PO TABS: 4.0000 mg | ORAL_TABLET | Freq: Four times a day (QID) | ORAL | Status: DC | PRN

## 2014-11-04 MED ORDER — ARTIFICIAL TEARS OP OINT
TOPICAL_OINTMENT | OPHTHALMIC | Status: DC | PRN
  Administered 2014-11-04: 1 via OPHTHALMIC

## 2014-11-04 MED ORDER — LIDOCAINE HCL (CARDIAC) 20 MG/ML IV SOLN
INTRAVENOUS | Status: DC | PRN
  Administered 2014-11-04: 100 mg via INTRAVENOUS

## 2014-11-04 MED ORDER — OXYCODONE HCL 5 MG/5ML PO SOLN: 5.0000 mg | Freq: Once | ORAL | Status: AC | PRN

## 2014-11-04 MED ORDER — MIDAZOLAM HCL 2 MG/2ML IJ SOLN
INTRAMUSCULAR | Status: AC
  Filled 2014-11-04: qty 2

## 2014-11-04 MED ORDER — PANTOPRAZOLE SODIUM 40 MG PO TBEC
40.0000 mg | DELAYED_RELEASE_TABLET | Freq: Every day | ORAL | Status: DC
  Administered 2014-11-05 – 2014-11-06 (×2): 40 mg via ORAL
  Filled 2014-11-04 (×2): qty 1

## 2014-11-04 MED ORDER — FENTANYL CITRATE 0.05 MG/ML IJ SOLN
INTRAMUSCULAR | Status: AC
  Filled 2014-11-04: qty 5

## 2014-11-04 MED ORDER — DEXAMETHASONE SODIUM PHOSPHATE 4 MG/ML IJ SOLN
INTRAMUSCULAR | Status: DC | PRN
  Administered 2014-11-04: 8 mg via INTRAVENOUS

## 2014-11-04 MED ORDER — LUBIPROSTONE 8 MCG PO CAPS
8.0000 ug | ORAL_CAPSULE | Freq: Two times a day (BID) | ORAL | Status: DC
  Administered 2014-11-05 – 2014-11-06 (×3): 8 ug via ORAL
  Filled 2014-11-04 (×6): qty 1

## 2014-11-04 MED ORDER — OXYCODONE HCL 5 MG PO TABS
5.0000 mg | ORAL_TABLET | Freq: Once | ORAL | Status: AC | PRN
  Administered 2014-11-04: 5 mg via ORAL

## 2014-11-04 MED ORDER — ONDANSETRON HCL 4 MG/2ML IJ SOLN
INTRAMUSCULAR | Status: DC | PRN
  Administered 2014-11-04: 4 mg via INTRAVENOUS

## 2014-11-04 MED ORDER — ROCURONIUM BROMIDE 100 MG/10ML IV SOLN
INTRAVENOUS | Status: DC | PRN
  Administered 2014-11-04 (×2): 10 mg via INTRAVENOUS
  Administered 2014-11-04: 50 mg via INTRAVENOUS

## 2014-11-04 MED ORDER — CEFAZOLIN SODIUM-DEXTROSE 2-3 GM-% IV SOLR
INTRAVENOUS | Status: AC
  Administered 2014-11-04: 2 g via INTRAVENOUS
  Filled 2014-11-04: qty 50

## 2014-11-04 MED ORDER — GLYCOPYRROLATE 0.2 MG/ML IJ SOLN
INTRAMUSCULAR | Status: DC | PRN
  Administered 2014-11-04: 0.6 mg via INTRAVENOUS

## 2014-11-04 MED ORDER — LACTATED RINGERS IV SOLN
INTRAVENOUS | Status: DC
  Administered 2014-11-04: 11:00:00 via INTRAVENOUS

## 2014-11-04 MED ORDER — PHENOL 1.4 % MT LIQD: 1.0000 | OROMUCOSAL | Status: DC | PRN

## 2014-11-04 MED ORDER — ONDANSETRON HCL 4 MG/2ML IJ SOLN: 4.0000 mg | Freq: Four times a day (QID) | INTRAMUSCULAR | Status: DC | PRN

## 2014-11-04 MED ORDER — OXYCODONE HCL 5 MG PO TABS
ORAL_TABLET | ORAL | Status: AC
Start: 2014-11-04 — End: 2014-11-05
  Filled 2014-11-04: qty 1

## 2014-11-04 MED ORDER — DEXAMETHASONE SODIUM PHOSPHATE 4 MG/ML IJ SOLN
INTRAMUSCULAR | Status: AC
  Filled 2014-11-04: qty 2

## 2014-11-04 MED ORDER — PROPOFOL 10 MG/ML IV BOLUS
INTRAVENOUS | Status: AC
  Filled 2014-11-04: qty 20

## 2014-11-04 MED ORDER — HYDROMORPHONE HCL 1 MG/ML IJ SOLN
0.2500 mg | INTRAMUSCULAR | Status: DC | PRN
  Administered 2014-11-04 (×2): 0.5 mg via INTRAVENOUS
  Administered 2014-11-04: 1 mg via INTRAVENOUS

## 2014-11-04 MED ORDER — HYDROMORPHONE HCL 1 MG/ML IJ SOLN
1.0000 mg | INTRAMUSCULAR | Status: DC | PRN
  Administered 2014-11-04 – 2014-11-05 (×3): 1 mg via INTRAVENOUS
  Filled 2014-11-04 (×3): qty 1

## 2014-11-04 MED ORDER — SENNOSIDES-DOCUSATE SODIUM 8.6-50 MG PO TABS: 1.0000 | ORAL_TABLET | Freq: Every evening | ORAL | Status: DC | PRN

## 2014-11-04 MED ORDER — BISACODYL 10 MG RE SUPP: 10.0000 mg | Freq: Every day | RECTAL | Status: DC | PRN

## 2014-11-04 MED ORDER — METHOCARBAMOL 500 MG PO TABS
500.0000 mg | ORAL_TABLET | Freq: Four times a day (QID) | ORAL | Status: DC | PRN
  Administered 2014-11-04 – 2014-11-06 (×5): 500 mg via ORAL
  Filled 2014-11-04 (×6): qty 1

## 2014-11-04 MED ORDER — MIDAZOLAM HCL 5 MG/5ML IJ SOLN
INTRAMUSCULAR | Status: DC | PRN
  Administered 2014-11-04: 2 mg via INTRAVENOUS

## 2014-11-04 SURGICAL SUPPLY — 58 items
BLADE SAW SGTL 18X1.27X75 (BLADE) ×2 IMPLANT
BLADE SURG ROTATE 9660 (MISCELLANEOUS) IMPLANT
CAPT HIP TOTAL 2 ×2 IMPLANT
CELLS DAT CNTRL 66122 CELL SVR (MISCELLANEOUS) ×1 IMPLANT
CLSR STERI-STRIP ANTIMIC 1/2X4 (GAUZE/BANDAGES/DRESSINGS) ×2 IMPLANT
COVER SURGICAL LIGHT HANDLE (MISCELLANEOUS) ×2 IMPLANT
DERMABOND ADVANCED (GAUZE/BANDAGES/DRESSINGS) ×1
DERMABOND ADVANCED .7 DNX12 (GAUZE/BANDAGES/DRESSINGS) ×1 IMPLANT
DRAPE C-ARM 42X72 X-RAY (DRAPES) ×2 IMPLANT
DRAPE IMP U-DRAPE 54X76 (DRAPES) ×2 IMPLANT
DRAPE STERI IOBAN 125X83 (DRAPES) ×2 IMPLANT
DRAPE U-SHAPE 47X51 STRL (DRAPES) ×6 IMPLANT
DRSG MEPILEX BORDER 4X12 (GAUZE/BANDAGES/DRESSINGS) ×2 IMPLANT
DRSG MEPILEX BORDER 4X8 (GAUZE/BANDAGES/DRESSINGS) IMPLANT
DURAPREP 26ML APPLICATOR (WOUND CARE) ×2 IMPLANT
ELECT BLADE 4.0 EZ CLEAN MEGAD (MISCELLANEOUS) ×2
ELECT BLADE TIP CTD 4 INCH (ELECTRODE) ×2 IMPLANT
ELECT CAUTERY BLADE 6.4 (BLADE) ×2 IMPLANT
ELECT REM PT RETURN 9FT ADLT (ELECTROSURGICAL) ×2
ELECTRODE BLDE 4.0 EZ CLN MEGD (MISCELLANEOUS) ×1 IMPLANT
ELECTRODE REM PT RTRN 9FT ADLT (ELECTROSURGICAL) ×1 IMPLANT
FACESHIELD WRAPAROUND (MASK) ×4 IMPLANT
GLOVE BIO SURGEON STRL SZ7.5 (GLOVE) ×2 IMPLANT
GLOVE BIOGEL PI IND STRL 6.5 (GLOVE) ×1 IMPLANT
GLOVE BIOGEL PI IND STRL 7.5 (GLOVE) ×1 IMPLANT
GLOVE BIOGEL PI IND STRL 8 (GLOVE) ×2 IMPLANT
GLOVE BIOGEL PI INDICATOR 6.5 (GLOVE) ×1
GLOVE BIOGEL PI INDICATOR 7.5 (GLOVE) ×1
GLOVE BIOGEL PI INDICATOR 8 (GLOVE) ×2
GLOVE ECLIPSE 6.5 STRL STRAW (GLOVE) ×2 IMPLANT
GLOVE ECLIPSE 7.0 STRL STRAW (GLOVE) IMPLANT
GLOVE ORTHO TXT STRL SZ7.5 (GLOVE) ×4 IMPLANT
GOWN STRL REUS W/ TWL LRG LVL3 (GOWN DISPOSABLE) ×2 IMPLANT
GOWN STRL REUS W/ TWL XL LVL3 (GOWN DISPOSABLE) ×1 IMPLANT
GOWN STRL REUS W/TWL 2XL LVL3 (GOWN DISPOSABLE) ×2 IMPLANT
GOWN STRL REUS W/TWL LRG LVL3 (GOWN DISPOSABLE) ×2
GOWN STRL REUS W/TWL XL LVL3 (GOWN DISPOSABLE) ×1
KIT BASIN OR (CUSTOM PROCEDURE TRAY) ×2 IMPLANT
KIT ROOM TURNOVER OR (KITS) ×2 IMPLANT
LIQUID BAND (GAUZE/BANDAGES/DRESSINGS) ×2 IMPLANT
MANIFOLD NEPTUNE II (INSTRUMENTS) ×2 IMPLANT
NS IRRIG 1000ML POUR BTL (IV SOLUTION) ×2 IMPLANT
PACK TOTAL JOINT (CUSTOM PROCEDURE TRAY) ×2 IMPLANT
PACK UNIVERSAL I (CUSTOM PROCEDURE TRAY) ×2 IMPLANT
PAD ARMBOARD 7.5X6 YLW CONV (MISCELLANEOUS) ×4 IMPLANT
RTRCTR WOUND ALEXIS 18CM MED (MISCELLANEOUS) ×2
SPONGE LAP 4X18 X RAY DECT (DISPOSABLE) ×2 IMPLANT
SUT ETHIBOND NAB CT1 #1 30IN (SUTURE) IMPLANT
SUT VIC AB 0 CT1 27 (SUTURE) ×1
SUT VIC AB 0 CT1 27XBRD ANBCTR (SUTURE) ×1 IMPLANT
SUT VIC AB 2-0 CT1 27 (SUTURE) ×1
SUT VIC AB 2-0 CT1 TAPERPNT 27 (SUTURE) ×1 IMPLANT
SUT VICRYL 4-0 PS2 18IN ABS (SUTURE) ×2 IMPLANT
SUT VLOC 180 0 24IN GS25 (SUTURE) ×2 IMPLANT
TOWEL OR 17X24 6PK STRL BLUE (TOWEL DISPOSABLE) ×2 IMPLANT
TOWEL OR 17X26 10 PK STRL BLUE (TOWEL DISPOSABLE) ×2 IMPLANT
TRAY FOLEY CATH 16FRSI W/METER (SET/KITS/TRAYS/PACK) ×2 IMPLANT
WATER STERILE IRR 1000ML POUR (IV SOLUTION) IMPLANT

## 2014-11-04 NOTE — Interval H&P Note (Signed)
History and Physical Interval Note:  11/04/2014 12:14 PM  Kathy Dawson  has presented today for surgery, with the diagnosis of Right Hip Osteoarthritis  The various methods of treatment have been discussed with the patient and family. After consideration of risks, benefits and other options for treatment, the patient has consented to  Procedure(s): TOTAL HIP ARTHROPLASTY ANTERIOR APPROACH, Possible Local Bonegrafting Acetabular Cyst (Right) as a surgical intervention .  The patient's history has been reviewed, patient examined, no change in status, stable for surgery.  I have reviewed the patient's chart and labs.  Questions were answered to the patient's satisfaction.     Kathy Dawson C

## 2014-11-04 NOTE — Brief Op Note (Signed)
11/04/2014  3:25 PM  PATIENT:  Kathy Dawson  59 y.o. female  PRE-OPERATIVE DIAGNOSIS:  Right Hip Osteoarthritis  POST-OPERATIVE DIAGNOSIS:  Right Hip Osteoarthritis  PROCEDURE:  Procedure(s): TOTAL HIP ARTHROPLASTY ANTERIOR APPROACH, Possible Local Bonegrafting Acetabular Cyst (Right)  SURGEON:  Surgeon(s) and Role:    * Marybelle Killings, MD - Primary  PHYSICIAN ASSISTANT:  Benjiman Core pa-c     ANESTHESIA:   general  EBL:  Total I/O In: 1950 [I.V.:1700; IV Piggyback:250] Out: 350 [Urine:150; Blood:200]  BLOOD ADMINISTERED:none  DRAINS: none   LOCAL MEDICATIONS USED:  NONE  SPECIMEN:  No Specimen  DISPOSITION OF SPECIMEN:  N/A  COUNTS:  YES  TOURNIQUET:  * No tourniquets in log *    PATIENT DISPOSITION:  PACU - hemodynamically stable.

## 2014-11-04 NOTE — Transfer of Care (Signed)
Immediate Anesthesia Transfer of Care Note  Patient: Kathy Dawson  Procedure(s) Performed: Procedure(s): TOTAL HIP ARTHROPLASTY ANTERIOR APPROACH, Possible Local Bonegrafting Acetabular Cyst (Right)  Patient Location: PACU  Anesthesia Type:General  Level of Consciousness: awake, alert , oriented and patient cooperative  Airway & Oxygen Therapy: Patient Spontanous Breathing and Patient connected to face mask oxygen  Post-op Assessment: Report given to RN, Post -op Vital signs reviewed and stable, Patient moving all extremities and Patient moving all extremities X 4  Post vital signs: Reviewed and stable  Last Vitals:  Filed Vitals:   11/04/14 1036  BP: 127/69  Pulse: 70  Temp: 37.1 C  Resp: 20    Complications: No apparent anesthesia complications

## 2014-11-04 NOTE — H&P (Signed)
Kathy Dawson is an 59 y.o. female.   A 59 year old white female presents for followup visit for right hip MRI scan that was done 07/31/2014.  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  Mild-to-moderate joint effusion also identified.  She states that she continues to have ongoing right groin pain.  She has had a diagnostic/therapeutic intra-articular right hip injection that did give extra relief for about a day.  She continues to have ongoing pain with weightbearing.    Past Medical History  Diagnosis Date  . GERD (gastroesophageal reflux disease)   . Knee pain   . Anemia   . DJD (degenerative joint disease), lumbosacral   . Complication of anesthesia 14 yrs. ago    woke up during the appendectomy    Past Surgical History  Procedure Laterality Date  . Tubal ligation    . Appendectomy    . Knee surgery    . Bilateral foot surgery    . Colonoscopy  12/08/2011    WIO:[id number] hemorrhoids/Sigmoid diverticulosis. Colonic polyp (tubular adenoma). Surveillance 2018  . 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 Esophagogastroduodenoscopy N/A 09/16/2014    Procedure: ESOPHAGOGASTRODUODENOSCOPY (EGD);  Surgeon: Daneil Dolin, MD;  Location: [facility] ENDO SUITE;  Service: Endoscopy;  Laterality: N/A;  1030am  . Maloney dilation N/A 09/16/2014    Procedure: Venia Minks DILATION;  Surgeon: Daneil Dolin, MD;  Location: [facility] ENDO SUITE;  Service: Endoscopy;  Laterality: N/A;    Family History  Problem Relation Age of Onset  . Colon polyps Father   . Cancer Father   . Colon cancer Neg Hx    Social History:  reports that she has quit smoking. Her smoking use included Cigarettes. She has a 5 pack-year smoking history. She quit smokeless tobacco use about 8 years ago. She reports that she does not drink alcohol or use illicit drugs.  Allergies:  Allergies   Allergen Reactions  . Clindamycin/Lincomycin Itching    Itching and Hives  . Prochlorperazine     Other reaction(s): Other (See Comments) Seizure like symptoms  . Prochlorperazine Edisylate   . Propoxyphene N-Acetaminophen Itching    No prescriptions prior to admission    No results found for this or any previous visit (from the past 48 hour(s)). No results found.  Review of Systems  Unable to perform ROS   There were no vitals taken for this visit. Physical Exam    A very pleasant white female, alert and oriented x3 and in no acute distress.  Gait is antalgic.  She has positive log roll.  Negative straight leg raise.  She is nontender over the right hip greater trochanteric bursa.  She is neurologically intact.  Skin:  Warm and dry.  No increase in respiratory effort.    ASSESSMENT:  Chronic right hip pain secondary to DJD and large acetabular cyst.     PLAN:  will schedule patient for right total hip replacement and acetabulum bone grafting  Surgical procedure along with possible risks and complications discussed. All questions answered.      Hollye Pritt M 11/04/2014, 10:02 AM

## 2014-11-04 NOTE — Anesthesia Preprocedure Evaluation (Signed)
Anesthesia Evaluation  Patient identified by MRN, date of birth, ID band Patient awake    Reviewed: Allergy & Precautions, NPO status   Airway Mallampati: II  TM Distance: >3 FB Neck ROM: Full    Dental  (+) Teeth Intact   Pulmonary former smoker,  breath sounds clear to auscultation        Cardiovascular negative cardio ROS  Rhythm:Regular Rate:Normal     Neuro/Psych    GI/Hepatic Neg liver ROS, GERD-  ,  Endo/Other  negative endocrine ROS  Renal/GU negative Renal ROS     Musculoskeletal  (+) Arthritis -,   Abdominal   Peds  Hematology  (+) anemia ,   Anesthesia Other Findings   Reproductive/Obstetrics                             Anesthesia Physical Anesthesia Plan  ASA: II  Anesthesia Plan: General   Post-op Pain Management:    Induction: Intravenous  Airway Management Planned: Oral ETT  Additional Equipment:   Intra-op Plan:   Post-operative Plan: Extubation in OR  Informed Consent: I have reviewed the patients History and Physical, chart, labs and discussed the procedure including the risks, benefits and alternatives for the proposed anesthesia with the patient or authorized representative who has indicated his/her understanding and acceptance.   Dental advisory given  Plan Discussed with: CRNA and Surgeon  Anesthesia Plan Comments:         Anesthesia Quick Evaluation

## 2014-11-04 NOTE — Op Note (Signed)
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 Depuy  Corail #8 stem 32 ball +1.5 neck , 48 press-fit cup   surgeon: Rodell Perna M.D.  Assistant : Benjiman Core PA-C , medically necessary and present for the entire procedure  Anesthesia Gen.  EBL less than 200 mL   drains none   complications: None   Procedure : after the induction of general anesthesia oral tracheal intubation application of the on the [place]  table boots patient was placed on the [place]  Table draping was performed C-arm was brought in good visualization leg showed she was 5 to 6 mm long on the operative side. DuraPrep was used standard sheets drapes large Betadine shower curtain drape was applied. Timeout procedure was completed Ancef was given prophylactically. Incision was made anterior oblique starting 2 cm lateral 2 cm in the inferior to the ASIS. Extendable  obliquely below the the greater trochanter. Subcutaneous tissue was developed and the skin protector was applied. Patient was split grasped with an Allis clamp elevated in the interval was developed with the fingertip down to the anterior capsule. Transverse process arteries were coagulated. Capsule was opened and sharp Homans were placed above and below the femoral neck. Neck was cut under direct C-arm visualization. Corkscrew was used to remove the head. Acetabulum was intact but the cartilage was extremely soft there was some chondromalacia of the ticker cartilage but no grade 4  Areas of wear. Cartilage was soft the weightbearing area in the area the corresponded with the 2 x 2 cm large cyst directly in the weightbearing area of the acetabulum. Sequential reaming was started and the cyst was encountered. Once there was medialization progressive reaming up to a 47 selection of a 48 cup. Using the femoral head bone graft was obtained  using the Leksell Ronjair packed into the cyst reverse reaming followed by placement of the cup checking under fluoroscopy for 45 of abduction 20 cup flexion insertion the cup directly under fluoroscopy securement cup was secure would not move. Center hole was filled. Permanent polyethylene liner without limp was inserted out the appropriate rotation impacted in checked and was secured cup was secure. Next the leg was taken down and over hydraulic hook was applied area retractors were inserted posterior capsule was excised using the Bovie as well as scalpel. Large trochanteric retractor was inserted lateralization cookie-cutter cup curette Ronjair for lateralization then sequential progression from the Chil Pepper broach    to the #8 which actually gave excellent cortical fit and fill. This checked under fluoroscopy looked good. Leg lengths looked good after an additional 2 mm were taken off the neck. Replacement with the #8 broached and +1.5 neck length 32 mm ball showed good restoration. Patient initially was 5 to 6 mm long and by fluoroscopic measurements was now 2 mm long.   Clide Dales was removed permanent stem was inserted. Ceramic ball was placed hip was reduced identical findings of excellent stability exhortation and 90 without dislocation extension of 45 external rotation 90 still good stability. There is trace shuck  leg lengths look good under fluoroscopy permanent spot fluoroscopy pictures were taken. Irrigation in standard closure with the lock suture ~subtendinous tissue subarticular closure Marcaine infiltration tincture benzoin Steri-Strips postop dressing and transferred recovery room.

## 2014-11-05 ENCOUNTER — Encounter (HOSPITAL_COMMUNITY): Payer: Self-pay | Admitting: Orthopaedic Surgery

## 2014-11-05 LAB — CBC
HCT: 26.6 % — ABNORMAL LOW (ref 36.0–46.0)
HEMOGLOBIN: 8.6 g/dL — AB (ref 12.0–15.0)
MCH: 28.1 pg (ref 26.0–34.0)
MCHC: 32.3 g/dL (ref 30.0–36.0)
MCV: 86.9 fL (ref 78.0–100.0)
Platelets: 262 10*3/uL (ref 150–400)
RBC: 3.06 MIL/uL — ABNORMAL LOW (ref 3.87–5.11)
RDW: 13.5 % (ref 11.5–15.5)
WBC: 8.2 10*3/uL (ref 4.0–10.5)

## 2014-11-05 LAB — PREPARE RBC (CROSSMATCH)

## 2014-11-05 LAB — BASIC METABOLIC PANEL
Anion gap: 4 — ABNORMAL LOW (ref 5–15)
BUN: 8 mg/dL (ref 6–23)
CO2: 28 mmol/L (ref 19–32)
CREATININE: 0.61 mg/dL (ref 0.50–1.10)
Calcium: 8.3 mg/dL — ABNORMAL LOW (ref 8.4–10.5)
Chloride: 104 mmol/L (ref 96–112)
GFR calc Af Amer: >90 mL/min (ref >90)
Glucose, Bld: 116 mg/dL — ABNORMAL HIGH (ref 70–99)
Potassium: 4.1 mmol/L (ref 3.5–5.1)
SODIUM: 136 mmol/L (ref 135–145)

## 2014-11-05 LAB — HEMOGLOBIN AND HEMATOCRIT, BLOOD
HCT: 27.5 % — ABNORMAL LOW (ref 36.0–46.0)
HEMOGLOBIN: 8.9 g/dL — AB (ref 12.0–15.0)

## 2014-11-05 LAB — ABO/RH: ABO/RH(D): O POS

## 2014-11-05 MED ORDER — SODIUM CHLORIDE 0.9 % IV SOLN
Freq: Once | INTRAVENOUS | Status: AC
  Administered 2014-11-05: 15:00:00 via INTRAVENOUS

## 2014-11-05 NOTE — Progress Notes (Signed)
Patient ID: Kathy Dawson, female   DOB: 1955/09/03, 59 y.o.   MRN: [id number]  Patient seen by me again.  Repeat Hgb 8.9 but states that she is feeling lightheaded, weak and fatigued when ambulating with PT.  Preop Hgb 12.8.  Therapist also reports that patient got lightheaded.  Will transfuse one unit PRBC's for symptomatic ABLA.  Repeat lab in the morning.

## 2014-11-05 NOTE — Progress Notes (Signed)
Subjective: Doing well. Pain controlled.  Has not been up with PT yet.   Objective: Vital signs in last 24 hours: Temp:  [97.2 F (36.2 C)-98.8 F (37.1 C)] 98.5 F (36.9 C) (03/22 0529) Pulse Rate:  [51-83] 76 (03/22 0529) Resp:  [10-20] 14 (03/22 0529) BP: (91-140)/(47-71) 91/55 mmHg (03/22 0529) SpO2:  [98 %-100 %] 100 % (03/22 0529) FiO2 (%):  [28 %] 28 % (03/21 2014) Weight:  [94.802 kg (209 lb)] 94.802 kg (209 lb) (03/21 1036)  Intake/Output from previous day: 03/21 0701 - 03/22 0700 In: 3869.8 [I.V.:3619.8; IV Piggyback:250] Out: 2250 [Urine:2050; Blood:200] Intake/Output this shift:     Recent Labs  11/05/14 0540  HGB 8.6*    Recent Labs  11/05/14 0540  WBC 8.2  RBC 3.06*  HCT 26.6*  PLT 262    Recent Labs  11/05/14 0540  NA 136  K 4.1  CL 104  CO2 28  BUN 8  CREATININE 0.61  GLUCOSE 116*  CALCIUM 8.3*   No results for input(s): LABPT, INR in the last 72 hours.  Neurologically intact Dorsiflexion/Plantar flexion intact  Assessment/Plan: ABLA. Will recheck H/H later.  Will transfuse if she becomes symptomatic or hgb < 8.  PT today.     Kathy Dawson M 11/05/2014, 8:10 AM

## 2014-11-05 NOTE — Evaluation (Signed)
Occupational Therapy Evaluation and Discharge Summary Patient Details Name: Kathy Dawson MRN: [id number] DOB: Sep 25, 1955 Today's Date: 11/05/2014    History of Present Illness Pt is a 59 y/o F s/p R THA direct anterior approach.  Pt's PMH includes R TKA, B foot surgery, and anemia.   Clinical Impression   Pt admitted with the above diagnosis and overall is doing very well with adls. Pt has had multiple knee surgeries and home is adapted well with equipment. Pt with no further acute OT needs at this time.  All safety education complete.  Pt did c/o mild dizziness/lightheadedness when up.  MD notified.  HGB being monitored.    Follow Up Recommendations  No OT follow up    Equipment Recommendations  None recommended by OT    Recommendations for Other Services       Precautions / Restrictions Precautions Precautions: Fall Precaution Comments: Reviewed WB status Restrictions Weight Bearing Restrictions: Yes RLE Weight Bearing: Weight bearing as tolerated      Mobility Bed Mobility Overal bed mobility: Needs Assistance Bed Mobility: Sit to Supine;Supine to Sit     Supine to sit: Min guard Sit to supine: Min assist   General bed mobility comments: Increased time supine>sit.  Min assist w/ bringing RLE into bed during sit>supine.  Transfers Overall transfer level: Needs assistance Equipment used: Rolling walker (2 wheeled) Transfers: Sit to/from Stand Sit to Stand: Min guard         General transfer comment: verbal cues for hand placement    Balance Overall balance assessment: Needs assistance Sitting-balance support: Feet supported Sitting balance-Leahy Scale: Good     Standing balance support: Bilateral upper extremity supported;During functional activity Standing balance-Leahy Scale: Fair Standing balance comment: Pt can let go of walker to manage clothing or stand at sink to groom.                             ADL Overall ADL's : Needs  assistance/impaired Eating/Feeding: Independent   Grooming: Supervision/safety;Standing   Upper Body Bathing: Set up;Sitting   Lower Body Bathing: Minimal assistance;Sit to/from stand Lower Body Bathing Details (indicate cue type and reason): min assist to reach foot and lower leg Upper Body Dressing : Set up;Sitting   Lower Body Dressing: Minimal assistance;Sit to/from stand Lower Body Dressing Details (indicate cue type and reason): assist with sock and shoe on operated leg. Toilet Transfer: Supervision/safety;Ambulation;Comfort height toilet;Grab bars   Toileting- Clothing Manipulation and Hygiene: Supervision/safety;Sit to/from stand   Tub/ Shower Transfer: Copy Details (indicate cue type and reason): discussed transfer techniques.  Pt familiar from mulitiple knee surgeries. Functional mobility during ADLs: Supervision/safety;Rolling walker General ADL Comments: Pt doing very well with adls. Pt does feel a bit lightheaded when walking requesting to sit to recover.       Vision     Perception     Praxis      Pertinent Vitals/Pain Pain Assessment: 0-10 Pain Score: 7  Pain Location: R hip in to knee Pain Descriptors / Indicators: Aching Pain Intervention(s): Limited activity within patient's tolerance;Premedicated before session;Monitored during session     Hand Dominance Right   Extremity/Trunk Assessment Upper Extremity Assessment Upper Extremity Assessment: Overall WFL for tasks assessed   Lower Extremity Assessment Lower Extremity Assessment: Defer to PT evaluation LLE Deficits / Details: as expected s/p R THA   Cervical / Trunk Assessment Cervical / Trunk Assessment: Normal   Communication Communication Communication:  No difficulties   Cognition Arousal/Alertness: Awake/alert Behavior During Therapy: WFL for tasks assessed/performed Overall Cognitive Status: Within Functional Limits for tasks assessed                      General Comments       Exercises Exercises: Total Joint     Shoulder Instructions      Home Living Family/patient expects to be discharged to:: Private residence Living Arrangements: Spouse/significant other Available Help at Discharge: Family;Available PRN/intermittently Type of Home: House Home Access: Stairs to enter [employer] of Steps: 1 Entrance Stairs-Rails: None Home Layout: Multi-level;Able to live on main level with bedroom/bathroom     Bathroom Shower/Tub: Walk-in shower;Door   [employer] Toilet: Standard     Home Equipment: Kasandra Knudsen - single point;Walker - 2 wheels;Bedside commode;Shower seat;Shower seat - built in          Prior Functioning/Environment Level of Independence: Independent with assistive device(s)             OT Diagnosis: Generalized weakness;Acute pain   OT Problem List:     OT Treatment/Interventions:      OT Goals(Current goals can be found in the care plan section) Acute Rehab OT Goals Patient Stated Goal: to go home OT Goal Formulation: All assessment and education complete, DC therapy  OT Frequency:     Barriers to D/C:            Co-evaluation              End of Session Equipment Utilized During Treatment: Rolling walker Nurse Communication: Mobility status;Other (comment) (spoke to MD about pt feeling weak/dizzy when up.)  Activity Tolerance: Patient tolerated treatment well Patient left: in bed;with call bell/phone within reach;with family/visitor present   Time: 1250-1108 OT Time Calculation (min): 1338 min Charges:  OT General Charges $OT Visit: 1 Procedure OT Evaluation $Initial OT Evaluation Tier I: 1 Procedure G-Codes:    Glenford Peers 11/08/2014, 1:15 PM  [phone]

## 2014-11-05 NOTE — Plan of Care (Signed)
Problem: Consults Goal: Diagnosis- Total Joint Replacement Outcome: Completed/Met Date Met:  11/05/14 Primary Total Hip Right

## 2014-11-05 NOTE — Progress Notes (Signed)
Utilization review completed.  

## 2014-11-05 NOTE — Evaluation (Signed)
Physical Therapy Evaluation Patient Details Name: Kathy Dawson MRN: [id number] DOB: [birth year] Today's Date: 11/05/2014   History of Present Illness  Pt is a 59 y/o F s/p R THA direct anterior approach.  Pt's PMH includes R TKA, B foot surgery, and anemia.  Clinical Impression  Pt is s/p R THA resulting in the deficits listed below (see PT Problem List). Pt demonstrated ability to ascend/descend 1 step and ambulate 90 ft today.  Pt reports minor lightheadedness during ambulation and a sitting break was taken w/ dissipation of sx. Pt will benefit from skilled PT to increase their independence and safety with mobility to allow discharge to the venue listed below.      Follow Up Recommendations Home health PT;Supervision - Intermittent    Equipment Recommendations  None recommended by PT    Recommendations for Other Services       Precautions / Restrictions Precautions Precautions: Fall Precaution Comments: Reviewed WB status Restrictions Weight Bearing Restrictions: Yes RLE Weight Bearing: Weight bearing as tolerated      Mobility  Bed Mobility Overal bed mobility: Needs Assistance Bed Mobility: Sit to Supine;Supine to Sit     Supine to sit: Min guard Sit to supine: Min assist   General bed mobility comments: Increased time supine>sit.  Min assist w/ bringing RLE into bed during sit>supine.  Transfers Overall transfer level: Needs assistance Equipment used: Rolling walker (2 wheeled) Transfers: Sit to/from Stand Sit to Stand: Min guard         General transfer comment: verbal cues for hand placement  Ambulation/Gait Ambulation/Gait assistance: Min guard Ambulation Distance (Feet): 90 Feet Assistive device: Rolling walker (2 wheeled) Gait Pattern/deviations: Step-to pattern;Step-through pattern;Decreased stance time - right;Decreased stride length;Antalgic;Trunk flexed   Gait velocity interpretation: Below normal speed for age/gender General Gait Details:  verbal cues to stand upright which pt corrects.  Pt became slightly lightheaded after ambulating 60 ft and took one sitting rest break.  Verbal cues to keep walker closer to her body while ambulating  Stairs Stairs: Yes Stairs assistance: Supervision Stair Management: Forwards;No rails;With walker Number of Stairs: 1    Wheelchair Mobility    Modified Rankin (Stroke Patients Only)       Balance Overall balance assessment: Needs assistance Sitting-balance support: No upper extremity supported;Feet supported Sitting balance-Leahy Scale: Fair     Standing balance support: Bilateral upper extremity supported Standing balance-Leahy Scale: Fair                               Pertinent Vitals/Pain Pain Assessment: 0-10 Pain Score: 8  Pain Location: R hip to R knee Pain Descriptors / Indicators: Dull;Constant Pain Intervention(s): Limited activity within patient's tolerance;Monitored during session;Repositioned    Home Living Family/patient expects to be discharged to:: Private residence Living Arrangements: Spouse/significant other Available Help at Discharge: Family;Available PRN/intermittently (daughter intermittently) Type of Home: House Home Access: Stairs to enter Entrance Stairs-Rails: None Entrance Stairs-Number of Steps: 1 Home Layout: Multi-level;Able to live on main level with bedroom/bathroom (3 story) Home Equipment: Cane - single point;Walker - 2 wheels;Bedside commode;Shower seat;Shower seat - built in      Prior Function Level of Independence: Independent with assistive device(s) (RW and cane when in pain)               Hand Dominance   Dominant Hand: Right    Extremity/Trunk Assessment  Lower Extremity Assessment: LLE deficits/detail   LLE Deficits / Details: as expected s/p R THA     Communication   Communication: No difficulties  Cognition Arousal/Alertness: Awake/alert Behavior During Therapy: WFL for  tasks assessed/performed Overall Cognitive Status: Within Functional Limits for tasks assessed                      General Comments      Exercises Total Joint Exercises Ankle Circles/Pumps: AROM;Both;10 reps;Supine Quad Sets: AROM;Both;10 reps;Supine Heel Slides: AROM;Right;5 reps;Supine Hip ABduction/ADduction: AROM;5 reps;Right;Supine      Assessment/Plan    PT Assessment Patient needs continued PT services  PT Diagnosis Difficulty walking;Generalized weakness;Abnormality of gait;Acute pain   PT Problem List Decreased strength;Decreased range of motion;Decreased activity tolerance;Decreased balance;Decreased mobility;Decreased coordination;Decreased knowledge of use of DME;Decreased safety awareness;Decreased knowledge of precautions;Pain  PT Treatment Interventions DME instruction;Gait training;Stair training;Functional mobility training;Therapeutic activities;Therapeutic exercise;Balance training;Neuromuscular re-education;Patient/family education;Modalities   PT Goals (Current goals can be found in the Care Plan section) Acute Rehab PT Goals Patient Stated Goal: to go home PT Goal Formulation: With patient/family Time For Goal Achievement: 11/19/14 Potential to Achieve Goals: Good    Frequency 7X/week   Barriers to discharge Decreased caregiver support  (family assit intermittent)    Co-evaluation               End of Session Equipment Utilized During Treatment: Gait belt Activity Tolerance: Patient tolerated treatment well;Patient limited by fatigue;Patient limited by pain Patient left: in bed;with call bell/phone within reach;with family/visitor present Nurse Communication: Mobility status         Time: 1102-1200 PT Time Calculation (min) (ACUTE ONLY): 58 min   Charges:   PT Evaluation $Initial PT Evaluation Tier I: 1 Procedure PT Treatments $Gait Training: 23-37 mins $Therapeutic Exercise: 8-22 mins   PT G CodesJoslyn Hy PT,  DPT [phone] 11/05/2014, 12:16 PM

## 2014-11-06 LAB — TYPE AND SCREEN
ABO/RH(D): O POS
Antibody Screen: NEGATIVE
Unit division: 0

## 2014-11-06 LAB — CBC
HEMATOCRIT: 25.8 % — AB (ref 36.0–46.0)
HEMOGLOBIN: 8.4 g/dL — AB (ref 12.0–15.0)
MCH: 28.3 pg (ref 26.0–34.0)
MCHC: 32.6 g/dL (ref 30.0–36.0)
MCV: 86.9 fL (ref 78.0–100.0)
Platelets: 215 10*3/uL (ref 150–400)
RBC: 2.97 MIL/uL — ABNORMAL LOW (ref 3.87–5.11)
RDW: 14.1 % (ref 11.5–15.5)
WBC: 6.5 10*3/uL (ref 4.0–10.5)

## 2014-11-06 LAB — HEMOGLOBIN AND HEMATOCRIT, BLOOD
HCT: 27.5 % — ABNORMAL LOW (ref 36.0–46.0)
Hemoglobin: 9 g/dL — ABNORMAL LOW (ref 12.0–15.0)

## 2014-11-06 MED ORDER — OXYCODONE-ACETAMINOPHEN 7.5-325 MG PO TABS: 1.0000 | ORAL_TABLET | Freq: Four times a day (QID) | ORAL | Status: DC | PRN

## 2014-11-06 MED ORDER — ASPIRIN EC 325 MG PO TBEC: 325.0000 mg | DELAYED_RELEASE_TABLET | Freq: Every day | ORAL | Status: DC

## 2014-11-06 MED ORDER — METHOCARBAMOL 500 MG PO TABS: 500.0000 mg | ORAL_TABLET | Freq: Four times a day (QID) | ORAL | Status: DC | PRN

## 2014-11-06 MED ORDER — MAGNESIUM CITRATE PO SOLN
0.5000 | Freq: Once | ORAL | Status: AC
  Administered 2014-11-06: 0.5 via ORAL

## 2014-11-06 NOTE — Progress Notes (Signed)
Physical Therapy Treatment Patient Details Name: Kathy Dawson MRN: [id number] DOB: 20-Sep-1955 Today's Date: 11/06/2014    History of Present Illness Pt is a 59 y/o F s/p R THA direct anterior approach.  Pt's PMH includes R TKA, B foot surgery, and anemia.    PT Comments    Pt reports feeling much better this session s/p transfusion and denies any sx of dizziness or lightheadedness.  Pt ambulated 100 ft w/ supervision and tolerated PT exercises well.  Pt is anticipating d/c to home w/ HHPT today.   Follow Up Recommendations  Home health PT;Supervision - Intermittent     Equipment Recommendations  None recommended by PT    Recommendations for Other Services       Precautions / Restrictions Precautions Precautions: Fall Precaution Comments: Reviewed WB status Restrictions Weight Bearing Restrictions: Yes RLE Weight Bearing: Weight bearing as tolerated    Mobility  Bed Mobility Overal bed mobility: Modified Independent Bed Mobility: Sit to Supine;Supine to Sit     Supine to sit: Modified independent (Device/Increase time) Sit to supine: Modified independent (Device/Increase time)   General bed mobility comments: increased time  Transfers Overall transfer level: Needs assistance Equipment used: Rolling walker (2 wheeled) Transfers: Sit to/from Stand Sit to Stand: Supervision         General transfer comment: supervision for safety  Ambulation/Gait Ambulation/Gait assistance: Supervision Ambulation Distance (Feet): 100 Feet Assistive device: Rolling walker (2 wheeled) Gait Pattern/deviations: Step-through pattern;Antalgic;Decreased stride length   Gait velocity interpretation: Below normal speed for age/gender General Gait Details: supervision and chair follow for safety   Stairs            Wheelchair Mobility    Modified Rankin (Stroke Patients Only)       Balance Overall balance assessment: Needs assistance Sitting-balance support: Feet  supported;No upper extremity supported Sitting balance-Leahy Scale: Good     Standing balance support: Bilateral upper extremity supported Standing balance-Leahy Scale: Fair                      Cognition Arousal/Alertness: Awake/alert Behavior During Therapy: WFL for tasks assessed/performed Overall Cognitive Status: Within Functional Limits for tasks assessed                      Exercises Total Joint Exercises Ankle Circles/Pumps: AROM;Both;10 reps;Seated Standing Hip Extension: AROM;Left;10 reps;Standing    General Comments General comments (skin integrity, edema, etc.): Pt denied any sx of dizziness or lightheadedness this session      Pertinent Vitals/Pain Pain Assessment: 0-10 Pain Score: 9  Pain Location: R hip Pain Descriptors / Indicators: Aching;Dull;Stabbing Pain Intervention(s): Limited activity within patient's tolerance;Monitored during session;Repositioned    Home Living                      Prior Function            PT Goals (current goals can now be found in the care plan section) Acute Rehab PT Goals Patient Stated Goal: to go home Progress towards PT goals: Progressing toward goals    Frequency  7X/week    PT Plan Current plan remains appropriate    Co-evaluation             End of Session Equipment Utilized During Treatment: Gait belt Activity Tolerance: Patient tolerated treatment well Patient left: in bed;with call bell/phone within reach;with SCD's reapplied     Time: 5573-2202 PT Time Calculation (min) (ACUTE ONLY): 28  min  Charges:  $Gait Training: 23-37 mins                    G CodesJoslyn Hy PT, [place] [phone] 11/06/2014, 10:09 AM

## 2014-11-06 NOTE — Progress Notes (Signed)
Called MD and updated him on the H&H lab results. Stated they were good to let the pt be d/c.

## 2014-11-06 NOTE — Progress Notes (Signed)
Subjective: Feeling much better after transfusion.  Hip pain controlled.  No bm yet.  Not much flatus. Denies abd pain, bloating.     Objective: Vital signs in last 24 hours: Temp:  [98.3 F (36.8 C)-99.3 F (37.4 C)] 98.9 F (37.2 C) (03/23 0546) Pulse Rate:  [80-91] 91 (03/23 0546) Resp:  [16-17] 17 (03/23 0546) BP: (90-107)/(46-58) 90/46 mmHg (03/23 0546) SpO2:  [95 %-100 %] 95 % (03/23 0546)  Intake/Output from previous day: 03/22 0701 - 03/23 0700 In: 4568.3 [P.O.:960; I.V.:3243.3; Blood:365] Out: 400 [Urine:400] Intake/Output this shift:     Recent Labs  11/05/14 0540 11/05/14 1225  HGB 8.6* 8.9*    Recent Labs  11/05/14 0540 11/05/14 1225  WBC 8.2  --   RBC 3.06*  --   HCT 26.6* 27.5*  PLT 262  --     Recent Labs  11/05/14 0540  NA 136  K 4.1  CL 104  CO2 28  BUN 8  CREATININE 0.61  GLUCOSE 116*  CALCIUM 8.3*   No results for input(s): LABPT, INR in the last 72 hours.  Exam:  Hip wound looks good.  steris intact.  No drainage or signs of infection.  Calf nontender, nvi.    Assessment/Plan: Anticipate d/c home this afternoon after PT.  Mag citrate ordered for constipation.     Kyrese Gartman M 11/06/2014, 7:56 AM

## 2014-11-06 NOTE — Progress Notes (Signed)
Pt states she is feeling better after the transfusion this morning despite lab work this morning. A repeat H&H ordered.

## 2014-11-06 NOTE — Care Management Note (Signed)
CARE MANAGEMENT NOTE 11/06/2014  Patient:  LABRENDA, LASKY   Account Number:  [id number]  Date Initiated:  11/06/2014  Documentation initiated by:  Ricki Miller  Subjective/Objective Assessment:   59 yr old female admitted with osteoarthritis of right hip. Patient underwent a right total hip arthroplasty.     Action/Plan:   Case manager spoke with patient. States she has rolling walker and 3in1. CM explained to patient that Dr. Lorin Mercy wants her to ambulate at home, will not have [place] therapy. patient husband is in agreement.   Anticipated DC Date:  11/06/2014   Anticipated DC Plan:  [place]  CM consult      PAC Choice  NA   Choice offered to / List presented to:     DME arranged  NA        HH arranged  NA      Status of service:  Completed, signed off Medicare Important Message given?   (If response is "NO", the following Medicare IM given date fields will be blank) Date Medicare IM given:   Medicare IM given by:   Date Additional Medicare IM given:   Additional Medicare IM given by:    Discharge Disposition:  HOME/SELF CARE  Per UR Regulation:  Reviewed for med. necessity/level of care/duration of stay  If discussed at [place] of Stay Meetings, dates discussed:    Comments:

## 2014-11-14 NOTE — Anesthesia Postprocedure Evaluation (Signed)
  Anesthesia Post-op Note  Patient: Kathy Dawson  Procedure(s) Performed: Procedure(s): TOTAL HIP ARTHROPLASTY ANTERIOR APPROACH, Possible Local Bonegrafting Acetabular Cyst (Right)  Patient Location: PACU  Anesthesia Type:General  Level of Consciousness: awake and alert   Airway and Oxygen Therapy: Patient Spontanous Breathing  Post-op Pain: mild  Post-op Assessment: Post-op Vital signs reviewed  Post-op Vital Signs: stable  Last Vitals:  Filed Vitals:   11/06/14 1241  BP: 97/58  Pulse: 83  Temp: 37.1 C  Resp: 18    Complications: No apparent anesthesia complications

## 2014-11-15 ENCOUNTER — Other Ambulatory Visit (HOSPITAL_COMMUNITY): Payer: Self-pay | Admitting: Orthopaedic Surgery

## 2014-11-15 ENCOUNTER — Encounter (HOSPITAL_COMMUNITY): Payer: Self-pay | Admitting: *Deleted

## 2014-11-18 ENCOUNTER — Encounter (HOSPITAL_COMMUNITY): Payer: Self-pay

## 2014-11-18 ENCOUNTER — Inpatient Hospital Stay (HOSPITAL_COMMUNITY): Payer: BLUE CROSS/BLUE SHIELD

## 2014-11-18 ENCOUNTER — Inpatient Hospital Stay (HOSPITAL_COMMUNITY): Payer: BLUE CROSS/BLUE SHIELD | Admitting: Anesthesiology

## 2014-11-18 ENCOUNTER — Inpatient Hospital Stay (HOSPITAL_COMMUNITY)
Admission: RE | Admit: 2014-11-18 | Discharge: 2014-11-20 | DRG: 468 | Disposition: A | Discharge status: Home / Self Care | Payer: BLUE CROSS/BLUE SHIELD | Source: Ambulatory Visit | Attending: Orthopaedic Surgery | Admitting: Orthopaedic Surgery

## 2014-11-18 ENCOUNTER — Encounter (HOSPITAL_COMMUNITY)
Admission: RE | Disposition: A | Discharge status: Home / Self Care | Payer: Self-pay | Source: Ambulatory Visit | Attending: Orthopaedic Surgery

## 2014-11-18 DIAGNOSIS — Z87891 Personal history of nicotine dependence: Secondary | ICD-10-CM | POA: Diagnosis not present

## 2014-11-18 DIAGNOSIS — Z881 Allergy status to other antibiotic agents status: Secondary | ICD-10-CM

## 2014-11-18 DIAGNOSIS — M25551 Pain in right hip: Secondary | ICD-10-CM | POA: Diagnosis present

## 2014-11-18 DIAGNOSIS — Z96651 Presence of right artificial knee joint: Secondary | ICD-10-CM | POA: Diagnosis present

## 2014-11-18 DIAGNOSIS — Z888 Allergy status to other drugs, medicaments and biological substances status: Secondary | ICD-10-CM

## 2014-11-18 DIAGNOSIS — T84020A Dislocation of internal right hip prosthesis, initial encounter: Principal | ICD-10-CM | POA: Diagnosis present

## 2014-11-18 DIAGNOSIS — Z419 Encounter for procedure for purposes other than remedying health state, unspecified: Secondary | ICD-10-CM

## 2014-11-18 DIAGNOSIS — Y793 Surgical instruments, materials and orthopedic devices (including sutures) associated with adverse incidents: Secondary | ICD-10-CM | POA: Diagnosis present

## 2014-11-18 DIAGNOSIS — R52 Pain, unspecified: Secondary | ICD-10-CM

## 2014-11-18 DIAGNOSIS — Z885 Allergy status to narcotic agent status: Secondary | ICD-10-CM | POA: Diagnosis not present

## 2014-11-18 DIAGNOSIS — Z96649 Presence of unspecified artificial hip joint: Secondary | ICD-10-CM

## 2014-11-18 HISTORY — PX: ANTERIOR HIP REVISION: SHX6527

## 2014-11-18 LAB — TYPE AND SCREEN
ABO/RH(D): O POS
Antibody Screen: NEGATIVE

## 2014-11-18 LAB — CBC
HCT: 33.4 % — ABNORMAL LOW (ref 36.0–46.0)
Hemoglobin: 10.5 g/dL — ABNORMAL LOW (ref 12.0–15.0)
MCH: 27.3 pg (ref 26.0–34.0)
MCHC: 31.4 g/dL (ref 30.0–36.0)
MCV: 87 fL (ref 78.0–100.0)
Platelets: 378 10*3/uL (ref 150–400)
RBC: 3.84 MIL/uL — AB (ref 3.87–5.11)
RDW: 14 % (ref 11.5–15.5)
WBC: 7.3 10*3/uL (ref 4.0–10.5)

## 2014-11-18 LAB — BASIC METABOLIC PANEL
Anion gap: 13 (ref 5–15)
BUN: 15 mg/dL (ref 6–23)
CO2: 23 mmol/L (ref 19–32)
Calcium: 9.2 mg/dL (ref 8.4–10.5)
Chloride: 104 mmol/L (ref 96–112)
Creatinine, Ser: 0.73 mg/dL (ref 0.50–1.10)
GFR calc Af Amer: >90 mL/min (ref >90)
GFR calc non Af Amer: >90 mL/min (ref >90)
GLUCOSE: 86 mg/dL (ref 70–99)
POTASSIUM: 3.8 mmol/L (ref 3.5–5.1)
Sodium: 140 mmol/L (ref 135–145)

## 2014-11-18 LAB — PROTIME-INR
INR: 1.14 (ref 0.00–1.49)
Prothrombin Time: 14.8 seconds (ref 11.6–15.2)

## 2014-11-18 LAB — APTT: aPTT: 46 seconds — ABNORMAL HIGH (ref 24–37)

## 2014-11-18 SURGERY — REVISION, TOTAL ARTHROPLASTY, HIP, ANTERIOR APPROACH
Anesthesia: General | Site: Hip | Laterality: Right

## 2014-11-18 MED ORDER — PROPOFOL 10 MG/ML IV BOLUS
INTRAVENOUS | Status: DC | PRN
  Administered 2014-11-18: 180 mg via INTRAVENOUS

## 2014-11-18 MED ORDER — EPHEDRINE SULFATE 50 MG/ML IJ SOLN
INTRAMUSCULAR | Status: AC
  Filled 2014-11-18: qty 3

## 2014-11-18 MED ORDER — ONDANSETRON HCL 4 MG/2ML IJ SOLN
INTRAMUSCULAR | Status: AC
  Filled 2014-11-18: qty 2

## 2014-11-18 MED ORDER — SENNOSIDES-DOCUSATE SODIUM 8.6-50 MG PO TABS
1.0000 | ORAL_TABLET | Freq: Every evening | ORAL | Status: DC | PRN
  Administered 2014-11-19: 1 via ORAL
  Filled 2014-11-18 (×2): qty 1

## 2014-11-18 MED ORDER — LABETALOL HCL 5 MG/ML IV SOLN
INTRAVENOUS | Status: DC | PRN
  Administered 2014-11-18 (×2): 5 mg via INTRAVENOUS

## 2014-11-18 MED ORDER — NEOSTIGMINE METHYLSULFATE 10 MG/10ML IV SOLN
INTRAVENOUS | Status: DC | PRN
  Administered 2014-11-18: 5 mg via INTRAVENOUS

## 2014-11-18 MED ORDER — ACETAMINOPHEN 650 MG RE SUPP: 650.0000 mg | Freq: Four times a day (QID) | RECTAL | Status: DC | PRN

## 2014-11-18 MED ORDER — ROCURONIUM BROMIDE 50 MG/5ML IV SOLN
INTRAVENOUS | Status: AC
  Filled 2014-11-18: qty 2

## 2014-11-18 MED ORDER — ONDANSETRON HCL 4 MG/2ML IJ SOLN: 4.0000 mg | Freq: Four times a day (QID) | INTRAMUSCULAR | Status: DC | PRN

## 2014-11-18 MED ORDER — OXYCODONE HCL 5 MG PO TABS
5.0000 mg | ORAL_TABLET | Freq: Once | ORAL | Status: AC | PRN
  Administered 2014-11-18: 5 mg via ORAL

## 2014-11-18 MED ORDER — GLYCOPYRROLATE 0.2 MG/ML IJ SOLN
INTRAMUSCULAR | Status: DC | PRN
  Administered 2014-11-18: .8 mg via INTRAVENOUS

## 2014-11-18 MED ORDER — HYDROMORPHONE HCL 1 MG/ML IJ SOLN
INTRAMUSCULAR | Status: AC
  Filled 2014-11-18: qty 1

## 2014-11-18 MED ORDER — OXYCODONE HCL 5 MG PO TABS
ORAL_TABLET | ORAL | Status: AC
  Filled 2014-11-18: qty 1

## 2014-11-18 MED ORDER — ROCURONIUM BROMIDE 100 MG/10ML IV SOLN
INTRAVENOUS | Status: DC | PRN
  Administered 2014-11-18: 50 mg via INTRAVENOUS

## 2014-11-18 MED ORDER — KETOROLAC TROMETHAMINE 30 MG/ML IJ SOLN
INTRAMUSCULAR | Status: AC
  Filled 2014-11-18: qty 1

## 2014-11-18 MED ORDER — NEOSTIGMINE METHYLSULFATE 10 MG/10ML IV SOLN
INTRAVENOUS | Status: AC
  Filled 2014-11-18: qty 3

## 2014-11-18 MED ORDER — FENTANYL CITRATE 0.05 MG/ML IJ SOLN
INTRAMUSCULAR | Status: AC
  Filled 2014-11-18: qty 5

## 2014-11-18 MED ORDER — ONDANSETRON HCL 4 MG/2ML IJ SOLN
INTRAMUSCULAR | Status: DC | PRN
  Administered 2014-11-18: 4 mg via INTRAVENOUS

## 2014-11-18 MED ORDER — LACTATED RINGERS IV SOLN
INTRAVENOUS | Status: DC
  Administered 2014-11-18: 13:00:00 via INTRAVENOUS

## 2014-11-18 MED ORDER — MENTHOL 3 MG MT LOZG: 1.0000 | LOZENGE | OROMUCOSAL | Status: DC | PRN

## 2014-11-18 MED ORDER — PROPOFOL 10 MG/ML IV BOLUS
INTRAVENOUS | Status: AC
  Filled 2014-11-18: qty 20

## 2014-11-18 MED ORDER — SUCCINYLCHOLINE CHLORIDE 20 MG/ML IJ SOLN
INTRAMUSCULAR | Status: AC
  Filled 2014-11-18: qty 1

## 2014-11-18 MED ORDER — METHOCARBAMOL 500 MG PO TABS
500.0000 mg | ORAL_TABLET | Freq: Four times a day (QID) | ORAL | Status: DC | PRN
  Administered 2014-11-19 – 2014-11-20 (×3): 500 mg via ORAL
  Filled 2014-11-18 (×4): qty 1

## 2014-11-18 MED ORDER — METOCLOPRAMIDE HCL 5 MG PO TABS: 5.0000 mg | ORAL_TABLET | Freq: Three times a day (TID) | ORAL | Status: DC | PRN

## 2014-11-18 MED ORDER — LIDOCAINE HCL (CARDIAC) 20 MG/ML IV SOLN
INTRAVENOUS | Status: DC | PRN
  Administered 2014-11-18: 80 mg via INTRAVENOUS

## 2014-11-18 MED ORDER — GLYCOPYRROLATE 0.2 MG/ML IJ SOLN
INTRAMUSCULAR | Status: AC
  Filled 2014-11-18: qty 8

## 2014-11-18 MED ORDER — OXYCODONE HCL 5 MG/5ML PO SOLN: 5.0000 mg | Freq: Once | ORAL | Status: AC | PRN

## 2014-11-18 MED ORDER — METOCLOPRAMIDE HCL 5 MG/ML IJ SOLN: 5.0000 mg | Freq: Three times a day (TID) | INTRAMUSCULAR | Status: DC | PRN

## 2014-11-18 MED ORDER — MIDAZOLAM HCL 5 MG/5ML IJ SOLN
INTRAMUSCULAR | Status: DC | PRN
  Administered 2014-11-18: 2 mg via INTRAVENOUS

## 2014-11-18 MED ORDER — DOCUSATE SODIUM 100 MG PO CAPS
100.0000 mg | ORAL_CAPSULE | Freq: Two times a day (BID) | ORAL | Status: DC
  Administered 2014-11-18 – 2014-11-20 (×4): 100 mg via ORAL
  Filled 2014-11-18 (×4): qty 1

## 2014-11-18 MED ORDER — STERILE WATER FOR INJECTION IJ SOLN
INTRAMUSCULAR | Status: AC
  Filled 2014-11-18: qty 20

## 2014-11-18 MED ORDER — OXYCODONE-ACETAMINOPHEN 5-325 MG PO TABS
ORAL_TABLET | ORAL | Status: AC
  Filled 2014-11-18: qty 1

## 2014-11-18 MED ORDER — ACETAMINOPHEN 325 MG PO TABS
650.0000 mg | ORAL_TABLET | Freq: Four times a day (QID) | ORAL | Status: DC | PRN
  Administered 2014-11-19 – 2014-11-20 (×2): 650 mg via ORAL
  Filled 2014-11-18 (×2): qty 2

## 2014-11-18 MED ORDER — HYDROMORPHONE HCL 1 MG/ML IJ SOLN
0.2500 mg | INTRAMUSCULAR | Status: DC | PRN
  Administered 2014-11-18 (×4): 0.5 mg via INTRAVENOUS

## 2014-11-18 MED ORDER — CEFAZOLIN SODIUM-DEXTROSE 2-3 GM-% IV SOLR: 2.0000 g | INTRAVENOUS | Status: DC

## 2014-11-18 MED ORDER — FENTANYL CITRATE 0.05 MG/ML IJ SOLN
INTRAMUSCULAR | Status: DC | PRN
  Administered 2014-11-18 (×2): 50 ug via INTRAVENOUS
  Administered 2014-11-18: 100 ug via INTRAVENOUS
  Administered 2014-11-18 (×6): 50 ug via INTRAVENOUS

## 2014-11-18 MED ORDER — HYDROMORPHONE HCL 1 MG/ML IJ SOLN
1.0000 mg | INTRAMUSCULAR | Status: DC | PRN
  Administered 2014-11-18 – 2014-11-20 (×12): 1 mg via INTRAVENOUS
  Filled 2014-11-18 (×12): qty 1

## 2014-11-18 MED ORDER — LIDOCAINE HCL (CARDIAC) 20 MG/ML IV SOLN
INTRAVENOUS | Status: AC
  Filled 2014-11-18: qty 5

## 2014-11-18 MED ORDER — LACTATED RINGERS IV SOLN
INTRAVENOUS | Status: DC | PRN
Start: 2014-11-18 — End: 2014-11-18
  Administered 2014-11-18: 15:00:00 via INTRAVENOUS

## 2014-11-18 MED ORDER — CEFAZOLIN SODIUM-DEXTROSE 2-3 GM-% IV SOLR
INTRAVENOUS | Status: DC | PRN
  Administered 2014-11-18: 2 g via INTRAVENOUS

## 2014-11-18 MED ORDER — METHOCARBAMOL 1000 MG/10ML IJ SOLN
500.0000 mg | Freq: Four times a day (QID) | INTRAVENOUS | Status: DC | PRN
  Administered 2014-11-18: 500 mg via INTRAVENOUS
  Filled 2014-11-18 (×2): qty 5

## 2014-11-18 MED ORDER — ROCURONIUM BROMIDE 50 MG/5ML IV SOLN
INTRAVENOUS | Status: AC
  Filled 2014-11-18: qty 1

## 2014-11-18 MED ORDER — OXYCODONE HCL 5 MG PO TABS
5.0000 mg | ORAL_TABLET | ORAL | Status: DC | PRN
  Administered 2014-11-18: 10 mg via ORAL
  Administered 2014-11-18: 5 mg via ORAL
  Administered 2014-11-19 – 2014-11-20 (×8): 10 mg via ORAL
  Filled 2014-11-18 (×9): qty 2

## 2014-11-18 MED ORDER — PHENOL 1.4 % MT LIQD: 1.0000 | OROMUCOSAL | Status: DC | PRN

## 2014-11-18 MED ORDER — ASPIRIN EC 325 MG PO TBEC
325.0000 mg | DELAYED_RELEASE_TABLET | Freq: Every day | ORAL | Status: DC
  Administered 2014-11-19 – 2014-11-20 (×2): 325 mg via ORAL
  Filled 2014-11-18 (×2): qty 1

## 2014-11-18 MED ORDER — KETOROLAC TROMETHAMINE 30 MG/ML IJ SOLN
30.0000 mg | Freq: Once | INTRAMUSCULAR | Status: AC
  Administered 2014-11-18: 30 mg via INTRAVENOUS

## 2014-11-18 MED ORDER — HYDROMORPHONE HCL 1 MG/ML IJ SOLN
0.2500 mg | INTRAMUSCULAR | Status: DC | PRN
Start: 2014-11-18 — End: 2014-11-18
  Administered 2014-11-18 (×4): 0.5 mg via INTRAVENOUS

## 2014-11-18 MED ORDER — MIDAZOLAM HCL 2 MG/2ML IJ SOLN
INTRAMUSCULAR | Status: AC
  Filled 2014-11-18: qty 2

## 2014-11-18 MED ORDER — ONDANSETRON HCL 4 MG PO TABS: 4.0000 mg | ORAL_TABLET | Freq: Four times a day (QID) | ORAL | Status: DC | PRN

## 2014-11-18 SURGICAL SUPPLY — 55 items
BLADE SAW SGTL 18X1.27X75 (BLADE) ×2 IMPLANT
BLADE SAW SGTL 18X1.27X75MM (BLADE) ×1
BLADE SURG ROTATE 9660 (MISCELLANEOUS) IMPLANT
CELLS DAT CNTRL 66122 CELL SVR (MISCELLANEOUS) ×1 IMPLANT
COVER SURGICAL LIGHT HANDLE (MISCELLANEOUS) ×3 IMPLANT
DERMABOND ADVANCED (GAUZE/BANDAGES/DRESSINGS) ×2
DERMABOND ADVANCED .7 DNX12 (GAUZE/BANDAGES/DRESSINGS) ×1 IMPLANT
DRAPE C-ARM 42X72 X-RAY (DRAPES) ×3 IMPLANT
DRAPE IMP U-DRAPE 54X76 (DRAPES) ×3 IMPLANT
DRAPE STERI IOBAN 125X83 (DRAPES) ×3 IMPLANT
DRAPE U-SHAPE 47X51 STRL (DRAPES) ×9 IMPLANT
DRSG MEPILEX BORDER 4X12 (GAUZE/BANDAGES/DRESSINGS) ×3 IMPLANT
DRSG MEPILEX BORDER 4X8 (GAUZE/BANDAGES/DRESSINGS) ×3 IMPLANT
DURAPREP 26ML APPLICATOR (WOUND CARE) ×3 IMPLANT
ELECT BLADE 4.0 EZ CLEAN MEGAD (MISCELLANEOUS)
ELECT BLADE TIP CTD 4 INCH (ELECTRODE) ×3 IMPLANT
ELECT CAUTERY BLADE 6.4 (BLADE) ×3 IMPLANT
ELECT REM PT RETURN 9FT ADLT (ELECTROSURGICAL) ×3
ELECTRODE BLDE 4.0 EZ CLN MEGD (MISCELLANEOUS) IMPLANT
ELECTRODE REM PT RTRN 9FT ADLT (ELECTROSURGICAL) ×1 IMPLANT
FACESHIELD WRAPAROUND (MASK) ×6 IMPLANT
GLOVE BIOGEL PI IND STRL 7.5 (GLOVE) ×1 IMPLANT
GLOVE BIOGEL PI IND STRL 8 (GLOVE) ×2 IMPLANT
GLOVE BIOGEL PI INDICATOR 7.5 (GLOVE) ×2
GLOVE BIOGEL PI INDICATOR 8 (GLOVE) ×4
GLOVE ORTHO TXT STRL SZ7.5 (GLOVE) ×6 IMPLANT
GLOVE SURG SS PI 6.5 STRL IVOR (GLOVE) ×3 IMPLANT
GOWN STRL REUS W/ TWL LRG LVL3 (GOWN DISPOSABLE) ×1 IMPLANT
GOWN STRL REUS W/ TWL XL LVL3 (GOWN DISPOSABLE) ×1 IMPLANT
GOWN STRL REUS W/TWL 2XL LVL3 (GOWN DISPOSABLE) ×3 IMPLANT
GOWN STRL REUS W/TWL LRG LVL3 (GOWN DISPOSABLE) ×2
GOWN STRL REUS W/TWL XL LVL3 (GOWN DISPOSABLE) ×2
HEAD FEMORAL 32 CERAMIC (Hips) ×3 IMPLANT
KIT BASIN OR (CUSTOM PROCEDURE TRAY) ×3 IMPLANT
KIT ROOM TURNOVER OR (KITS) ×3 IMPLANT
MANIFOLD NEPTUNE II (INSTRUMENTS) ×3 IMPLANT
NS IRRIG 1000ML POUR BTL (IV SOLUTION) ×3 IMPLANT
PACK TOTAL JOINT (CUSTOM PROCEDURE TRAY) ×3 IMPLANT
PACK UNIVERSAL I (CUSTOM PROCEDURE TRAY) ×3 IMPLANT
PAD ARMBOARD 7.5X6 YLW CONV (MISCELLANEOUS) ×6 IMPLANT
RTRCTR WOUND ALEXIS 18CM MED (MISCELLANEOUS) ×3
STAPLER VISISTAT 35W (STAPLE) ×3 IMPLANT
STEM CORAIL KA09 (Stem) ×3 IMPLANT
SUT ETHIBOND NAB CT1 #1 30IN (SUTURE) IMPLANT
SUT VIC AB 0 CT1 27 (SUTURE) ×2
SUT VIC AB 0 CT1 27XBRD ANBCTR (SUTURE) ×1 IMPLANT
SUT VIC AB 2-0 CT1 27 (SUTURE) ×4
SUT VIC AB 2-0 CT1 TAPERPNT 27 (SUTURE) ×2 IMPLANT
SUT VICRYL 4-0 PS2 18IN ABS (SUTURE) ×3 IMPLANT
SUT VLOC 180 0 24IN GS25 (SUTURE) ×3 IMPLANT
TOWEL OR 17X24 6PK STRL BLUE (TOWEL DISPOSABLE) ×3 IMPLANT
TOWEL OR 17X26 10 PK STRL BLUE (TOWEL DISPOSABLE) ×6 IMPLANT
TRAY FOLEY CATH 16FRSI W/METER (SET/KITS/TRAYS/PACK) IMPLANT
WATER STERILE IRR 1000ML POUR (IV SOLUTION) ×6 IMPLANT
YANKAUER SUCT BULB TIP NO VENT (SUCTIONS) ×3 IMPLANT

## 2014-11-18 NOTE — OR Nursing (Signed)
Xray results called to Dr. Lorin Mercy, no new orders at this time.

## 2014-11-18 NOTE — OR Nursing (Signed)
Called ortho tech for placement of overhead frame and trapeze. He stated there are none available at this time, will have to wait for discharges.

## 2014-11-18 NOTE — Interval H&P Note (Signed)
History and Physical Interval Note:  11/18/2014 3:14 PM  Kathy Dawson  has presented today for surgery, with the diagnosis of Right Total Hip Arthroplasty Femoral Malposition  The various methods of treatment have been discussed with the patient and family. After consideration of risks, benefits and other options for treatment, the patient has consented to  Procedure(s): Right Total Hip Arthroplasty Femoral Revision-Anteior approach (Right) as a surgical intervention .  The patient's history has been reviewed, patient examined, no change in status, stable for surgery.  I have reviewed the patient's chart and labs.  Questions were answered to the patient's satisfaction.   Discussed with pt that femoral perforation likely occurred with early broaching and that lateral image with early broach showed good position. Plan removal and likely upsize to larger size with confirmation that stem is down the canal. Risk of femur fracture, troch fx, need for cables, risk of infection , reoperation with posterior approach discussed. Discussed with her anterior vs posterior options etc. She understands and agrees and wishes to proceed.   Arnoldo Hildreth C

## 2014-11-18 NOTE — H&P (Signed)
Kathy Dawson is an 59 y.o. female.   Chief Complaint: right hip pain HPI:  Patient about 2 weeks s/p right total hip replacement.  Seen in the office for follow up and xrays were taken which showed that the distal femoral component had cut out of the femur.  Being taken back to the OR for total hip revision.    Past Medical History  Diagnosis Date  . GERD (gastroesophageal reflux disease)   . Knee pain   . Anemia   . DJD (degenerative joint disease), lumbosacral   . Complication of anesthesia 14 yrs. ago    woke up during the appendectomy    Past Surgical History  Procedure Laterality Date  . Tubal ligation    . Appendectomy    . Knee surgery    . Bilateral foot surgery    . Colonoscopy  12/08/2011    JME:[id number] hemorrhoids/Sigmoid diverticulosis. Colonic polyp (tubular adenoma). Surveillance 2018  . 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 Esophagogastroduodenoscopy N/A 09/16/2014    Procedure: ESOPHAGOGASTRODUODENOSCOPY (EGD);  Surgeon: Daneil Dolin, MD;  [facility];  Service: Endoscopy;  Laterality: N/A;  1030am  . Maloney dilation N/A 09/16/2014    Procedure: Venia Minks DILATION;  Surgeon: Daneil Dolin, MD;  Location: [facility];  Service: Endoscopy;  Laterality: N/A;  . Total hip arthroplasty Right 11/04/2014    Procedure: TOTAL HIP ARTHROPLASTY ANTERIOR APPROACH, Possible Local Bonegrafting Acetabular Cyst;  Surgeon: Marybelle Killings, MD;  Location: [place];  Service: Orthopedics;  Laterality: Right;    Family History  Problem Relation Age of Onset  . Colon polyps Father   . Cancer Father   . Colon cancer Neg Hx    Social History:  reports that she has quit smoking. Her smoking use included Cigarettes. She has a 5 pack-year smoking history. She quit smokeless tobacco use about 8 years ago. She reports that she does not drink alcohol or use illicit drugs.  Allergies:  Allergies  Allergen Reactions  . Clindamycin/Lincomycin Itching    Itching and Hives  .  Prochlorperazine     Other reaction(s): Other (See Comments) Seizure like symptoms  . Prochlorperazine Edisylate   . Propoxyphene N-Acetaminophen Itching    No prescriptions prior to admission    No results found for this or any previous visit (from the past 48 hour(s)). No results found.  Review of Systems  Constitutional: Negative.   HENT: Negative.   Respiratory: Negative.   Cardiovascular: Negative.   Musculoskeletal: Positive for joint pain.  Neurological: Negative.   Psychiatric/Behavioral: Negative.     There were no vitals taken for this visit. Physical Exam  Constitutional: She is oriented to person, place, and time. She appears well-developed and well-nourished.  HENT:  Head: Normocephalic and atraumatic.  Eyes: Pupils are equal, round, and reactive to light.  Neck: Normal range of motion.  Respiratory: Effort normal.  Musculoskeletal: She exhibits tenderness.  Positive log roll.   Neurological: She is alert and oriented to person, place, and time.     Assessment/Plan S/p post right total hip replacement.  Failed femoral component.  Will proceed with total hip revision.  Surgical procedure discussed along with potential risks,complication, and recovery time.  All questions answered.    Josey Dettmann M 11/18/2014, 12:06 AM

## 2014-11-18 NOTE — Transfer of Care (Signed)
Immediate Anesthesia Transfer of Care Note  Patient: Kathy Dawson  Procedure(s) Performed: Procedure(s): Right Total Hip Arthroplasty Femoral Revision-Anteior approach (Right)  Patient Location: PACU  Anesthesia Type:General  Level of Consciousness: awake, alert  and oriented  Airway & Oxygen Therapy: Patient Spontanous Breathing and Patient connected to nasal cannula oxygen  Post-op Assessment: Report given to RN and Post -op Vital signs reviewed and stable  Post vital signs: Reviewed and stable  Last Vitals:  Filed Vitals:   11/18/14 1742  BP: 149/95  Pulse: 92  Temp: 36.9 C  Resp: 10    Complications: No apparent anesthesia complications

## 2014-11-18 NOTE — Anesthesia Procedure Notes (Signed)
Procedure Name: Intubation Date/Time: 11/18/2014 3:58 PM Performed by: Clearnce Sorrel Pre-anesthesia Checklist: Patient identified, Timeout performed, Emergency Drugs available, Suction available and Patient being monitored Patient Re-evaluated:Patient Re-evaluated prior to inductionOxygen Delivery Method: Circle system utilized Preoxygenation: Pre-oxygenation with 100% oxygen Intubation Type: IV induction Ventilation: Mask ventilation without difficulty Laryngoscope Size: Mac and 3 Grade View: Grade I Tube type: Oral Tube size: 7.0 mm Number of attempts: 1 Placement Confirmation: ETT inserted through vocal cords under direct vision,  breath sounds checked- equal and bilateral and positive ETCO2 Secured at: 22 cm Tube secured with: Tape Dental Injury: Teeth and Oropharynx as per pre-operative assessment

## 2014-11-18 NOTE — Anesthesia Preprocedure Evaluation (Signed)
Anesthesia Evaluation  Patient identified by MRN, date of birth, ID band Patient awake    Reviewed: Allergy & Precautions, NPO status , Patient's Chart, lab work & pertinent test results  Airway Mallampati: II   Neck ROM: full    Dental   Pulmonary former smoker,  breath sounds clear to auscultation        Cardiovascular negative cardio ROS  Rhythm:regular Rate:Normal     Neuro/Psych    GI/Hepatic GERD-  ,  Endo/Other    Renal/GU      Musculoskeletal  (+) Arthritis -,   Abdominal   Peds  Hematology   Anesthesia Other Findings   Reproductive/Obstetrics                             Anesthesia Physical Anesthesia Plan  ASA: II  Anesthesia Plan: General   Post-op Pain Management:    Induction: Intravenous  Airway Management Planned: Oral ETT  Additional Equipment:   Intra-op Plan:   Post-operative Plan: Extubation in OR  Informed Consent: I have reviewed the patients History and Physical, chart, labs and discussed the procedure including the risks, benefits and alternatives for the proposed anesthesia with the patient or authorized representative who has indicated his/her understanding and acceptance.     Plan Discussed with: CRNA, Anesthesiologist and Surgeon  Anesthesia Plan Comments:         Anesthesia Quick Evaluation

## 2014-11-19 ENCOUNTER — Encounter (HOSPITAL_COMMUNITY): Payer: Self-pay | Admitting: Orthopaedic Surgery

## 2014-11-19 LAB — BASIC METABOLIC PANEL
ANION GAP: 5 (ref 5–15)
BUN: 12 mg/dL (ref 6–23)
CALCIUM: 8.7 mg/dL (ref 8.4–10.5)
CO2: 30 mmol/L (ref 19–32)
Chloride: 100 mmol/L (ref 96–112)
Creatinine, Ser: 0.66 mg/dL (ref 0.50–1.10)
GFR calc Af Amer: >90 mL/min (ref >90)
Glucose, Bld: 106 mg/dL — ABNORMAL HIGH (ref 70–99)
Potassium: 4.2 mmol/L (ref 3.5–5.1)
SODIUM: 135 mmol/L (ref 135–145)

## 2014-11-19 LAB — CBC
HEMATOCRIT: 25.5 % — AB (ref 36.0–46.0)
Hemoglobin: 8.1 g/dL — ABNORMAL LOW (ref 12.0–15.0)
MCH: 27.7 pg (ref 26.0–34.0)
MCHC: 31.8 g/dL (ref 30.0–36.0)
MCV: 87.3 fL (ref 78.0–100.0)
Platelets: 354 10*3/uL (ref 150–400)
RBC: 2.92 MIL/uL — AB (ref 3.87–5.11)
RDW: 14 % (ref 11.5–15.5)
WBC: 6.8 10*3/uL (ref 4.0–10.5)

## 2014-11-19 NOTE — Evaluation (Signed)
Physical Therapy Evaluation Patient Details Name: Kathy Dawson MRN: [id number] DOB: 1955/08/21 Today's Date: 11/19/2014   History of Present Illness  59 y.o. female admitted to [facility] on 11/18/14 s/p R direct anterior THA revision. She has significant PMHx of anemia, lumbosacral DJD, R TKA, and R THA 11/04/2014.  Clinical Impression  Pt is POD #1 and is is up and moving limited by pain and mild lightheadedness in standing. She will likely progress well enough to d/c home with family's assist and HHPT at discharge.  I would do both AM and PM sessions tomorrow to ensure safe progression and competency on the stairs.   PT to follow acutely for deficits listed below.       Follow Up Recommendations Home health PT;Supervision for mobility/OOB    Equipment Recommendations  None recommended by PT    Recommendations for Other Services   NA    Precautions / Restrictions Precautions Precautions: Fall Precaution Comments: reviewed WBAT status with pt after confirmed with MD.  Restrictions RLE Weight Bearing: Weight bearing as tolerated (confirmed with MD over the phone)      Mobility   Transfers Overall transfer level: Needs assistance Equipment used: Rolling walker (2 wheeled) Transfers: Sit to/from Stand Sit to Stand: Min guard         General transfer comment: Min guard assist for safety during transitions.    Ambulation/Gait Ambulation/Gait assistance: Supervision Ambulation Distance (Feet): 65 Feet Assistive device: Rolling walker (2 wheeled) Gait Pattern/deviations: Step-through pattern;Antalgic;Trunk flexed Gait velocity: decreased Gait velocity interpretation: Below normal speed for age/gender General Gait Details: supervision for safety, chair to follow to encourage increased gait distance.  Pt is also mildly lightheaded in standing, so chair for increased safety as well. Supervision due to abnormal gait pattern and pain.       Balance Overall balance assessment: Needs  assistance Sitting-balance support: Feet supported;Bilateral upper extremity supported Sitting balance-Leahy Scale: Fair     Standing balance support: Bilateral upper extremity supported;Single extremity supported;No upper extremity supported Standing balance-Leahy Scale: Fair                               Pertinent Vitals/Pain Pain Assessment: 0-10 Pain Score: 7  Pain Location: right anterior hip Pain Descriptors / Indicators: Aching;Burning Pain Intervention(s): Limited activity within patient's tolerance;Monitored during session;Repositioned;Ice applied    Home Living Family/patient expects to be discharged to:: Private residence Living Arrangements: Spouse/significant other Available Help at Discharge: Family;Available PRN/intermittently Type of Home: House Home Access: Stairs to enter Entrance Stairs-Rails: None Entrance Stairs-Number of Steps: 1 Home Layout: Multi-level;Able to live on main level with bedroom/bathroom Home Equipment: Kasandra Knudsen - single point;Walker - 2 wheels;Bedside commode;Shower seat;Shower seat - built in      Prior Function Level of Independence: Independent with assistive device(s)         Comments: RW     Hand Dominance   Dominant Hand: Right    Extremity/Trunk Assessment   Upper Extremity Assessment: Defer to OT evaluation           Lower Extremity Assessment: RLE deficits/detail RLE Deficits / Details: right leg with normal post op pain and weakness.  Pt with 3+/5 ankle, 3/5 knee ext, 2+/5 hip    Cervical / Trunk Assessment: Other exceptions  Communication   Communication: No difficulties  Cognition Arousal/Alertness: Awake/alert Behavior During Therapy: WFL for tasks assessed/performed Overall Cognitive Status: Within Functional Limits for tasks assessed  Exercises Total Joint Exercises Ankle Circles/Pumps: AROM;Both;20 reps;Supine Quad Sets: AROM;Both;15 reps;Supine Heel Slides:  AAROM;Right;10 reps;Supine Hip ABduction/ADduction: AAROM;Right;10 reps;Supine Long Arc Quad: AROM;Right;10 reps;Supine      Assessment/Plan    PT Assessment Patient needs continued PT services  PT Diagnosis Difficulty walking;Abnormality of gait;Generalized weakness;Acute pain   PT Problem List Decreased strength;Decreased range of motion;Decreased activity tolerance;Decreased balance;Decreased mobility;Decreased knowledge of use of DME;Decreased knowledge of precautions;Pain  PT Treatment Interventions DME instruction;Gait training;Stair training;Functional mobility training;Therapeutic activities;Therapeutic exercise;Balance training;Neuromuscular re-education;Patient/family education;Manual techniques;Modalities   PT Goals (Current goals can be found in the Care Plan section) Acute Rehab PT Goals Patient Stated Goal: to be able to wear high heels to church again PT Goal Formulation: With patient/family Time For Goal Achievement: 11/26/14 Potential to Achieve Goals: Good    Frequency 7X/week   Barriers to discharge           End of Session   Activity Tolerance: Patient limited by fatigue;Patient limited by pain Patient left: in chair;with call bell/phone within reach           Time: 1347-1412 PT Time Calculation (min) (ACUTE ONLY): 25 min   Charges:   PT Evaluation $Initial PT Evaluation Tier I: 1 Procedure PT Treatments $Gait Training: 8-22 mins        Jacquese Cassarino B. [place], [place], DPT [phone]   11/19/2014, 2:26 PM

## 2014-11-19 NOTE — Anesthesia Postprocedure Evaluation (Signed)
  Anesthesia Post-op Note  Patient: Kathy Dawson  Procedure(s) Performed: Procedure(s): Right Total Hip Arthroplasty Femoral Revision-Anteior approach (Right)  Patient Location: PACU  Anesthesia Type:General  Level of Consciousness: awake and alert   Airway and Oxygen Therapy: Patient Spontanous Breathing  Post-op Pain: moderate  Post-op Assessment: Post-op Vital signs reviewed, Patient's Cardiovascular Status Stable and Respiratory Function Stable  Post-op Vital Signs: Reviewed  Filed Vitals:   11/19/14 0425  BP: 104/60  Pulse: 83  Temp: 36.8 C  Resp: 16    Complications: No apparent anesthesia complications

## 2014-11-19 NOTE — Progress Notes (Signed)
PT Cancellation Note  Patient Details Name: Kathy Dawson MRN: [id number] DOB: 31-Jul-1956   Cancelled Treatment:    Reason Eval/Treat Not Completed: Pain limiting ability to participate.  Pt in 8-9/10 right hip pain and reports pain got "out of control" this morning and she is trying to catch up now.  Gave pt new ice pack and asked RN for IV pain medication.  PT to check back later to see if pain is more manageable and pt is able to get up.  Thanks,    Barbarann Ehlers. [place], [place], DPT [phone]   11/19/2014, 12:13 PM

## 2014-11-19 NOTE — Op Note (Signed)
NAMEINOCENCIA, MURTAUGH NO.:  [id number]  MEDICAL RECORD NO.:  [id number]  LOCATION:  5N06C                        FACILITY:  [facility]  PHYSICIAN:  Persephonie Hegwood C. Lorin Mercy, M.D.    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  POSTOPERATIVE DIAGNOSIS:  Right femoral cortical penetration postop total hip arthroplasty.  PROCEDURE:  Revision of right direct anterior femoral stem cortical penetration with revision of femoral side only.  SURGEON:  Annetta Deiss C. Lorin Mercy, M.D.  ASSISTANT:  Alyson Locket. Velora Heckler., medically necessary and present for the entire procedure.  SECOND ASSISTANT:  April Fulp, RNFA.  ESTIMATED BLOOD LOSS:  Less than 200 mL.  DRAINS:  None.  INDICATIONS:  This 94 kg female had total hip arthroplasty a couple of weeks ago.  Intraoperative AP and lateral fluoroscopic images of broach showed good position down the canal.  Cortical penetration occurred sometime during the case and final AP with a stem in, checking leg lengths and AP of the hip, showed good position and alignment, and the patient had unrecognized penetration out through the cortical hole below the lesser trochanter posteriorly.  The AP x-ray showed the stem exactly centered down the canal and she had excellent stability.  When she turned to the office 1 week postop, followup films showed the cortical penetration and she was brought in for revision.  DESCRIPTION OF PROCEDURE:  After induction general anesthesia, orotracheal intubation, the patient was placed on the Hana table after having boots applied prior to being positioned on the table.  Keels were checked to make sure they were down the boot.  C-arm was brought in for visualization, confirmation, positioning.  10/15 drapes were used in standard prepping with DuraPrep  after I had scrubbed the hip incision with Betadine scrub brush and then dried it with a sterile towel. DuraPrep was used.  Once it was dry, split U drapes were reapplied, then large shower curtain, Betadine, Steri-Drape, half sheet at the top, half sheet on the opposite side.  Time-out procedure completed.  Old incision was opened.  The patient had a subcuticular closure.  2-0 Vicryl sutures were removed.  The V-Loc suture was nicked, cut in several places, and then removed.  This fell directly down onto the femoral neck and a little bit of postoperative seroma was present which was irrigated, suctioned, and a few small clots were removed.  The patient did not have any evidence of infection.  No cultures were taken.  Hip was dislocated. The hook was applied.  Hydraulic once foot was down and over. Externally rotated to 110 degrees.  Attempts to remove the ceramic ball were unsuccessful despite using an impactor and also 18 more extractor. Continued work finally with 18 more extractor was hooked and the whole stem came out in 1 piece.  Chili pepper was restarted with the Mueller medial large trochanteric clamp, and using the hydraulic, care was taken make sure that it was hugging the anterior cortex parallel.  Due to the patient's large size at 94.8  kg, soft tissue made it difficult to come directly straight down the stem.  Using a bone hook, pulling it slightly lateral, care was taken not to pull too hard with femoral cortex perforation, there was concern that she might have a subtroch fracture during the procedure with traction.  Trial was placed down the canal, checked after a trial head was placed.  The hip was reduced after removing the hydraulic AP and lateral with the hip externally rotated to 90 degrees, showed it was directly down the middle of the stem.  We then progressed to 9 which gave a nice tight fit.  The permanent stem was inserted.  #9 Corail with a standard neck 1.5 mm  and put a trial ball, aligned and then reduced hip again and then made sure that the permanent stem was directly down the canal AP and lateral.  Took the hip out again and then put the final ceramic ball in position.  Regional ceramic ball with difficulty removing, it had been scratched up some, and it was not appropriate for reinsertion, although original plan had been to try to reinsert it if possible.  The trunnion was cleaned prior to placing the ball, was impacted, hip was reduced.  Final spot AP lateral pictures were taken.  During the surgery, the posterior cortical perforation was visualized inside the canal and care was taken not ever go back out through that area though permanent prosthesis has excellent fit and fill.  The canal was stable.  Irrigation was performed followed by V-Loc closure, 2-0 Vicryl subcutaneous tissue, and skin staple closure.  The patient tolerated the procedure well.  Instrument count and needle count were correct.     Aeon Koors C. Lorin Mercy, M.D.     MCY/MEDQ  D:  11/18/2014  T:  11/19/2014  Job:  [id number]

## 2014-11-19 NOTE — Progress Notes (Signed)
Subjective: 1 Day Post-Op Procedure(s) (LRB): Right Total Hip Arthroplasty Femoral Revision-Anteior approach (Right) Patient reports pain as 7 on 0-10 scale.    Objective: Vital signs in last 24 hours: Temp:  [97.9 F (36.6 C)-98.9 F (37.2 C)] 98.2 F (36.8 C) (04/05 0425) Pulse Rate:  [58-92] 83 (04/05 0425) Resp:  [9-20] 16 (04/05 0425) BP: (102-163)/(60-95) 104/60 mmHg (04/05 0425) SpO2:  [94 %-100 %] 100 % (04/05 0425) Weight:  [94.802 kg (209 lb)] 94.802 kg (209 lb) (04/04 1302)  Intake/Output from previous day: 04/04 0701 - 04/05 0700 In: 555 [I.V.:500; IV Piggyback:55] Out: 914 [Urine:175; Blood:600] Intake/Output this shift:     Recent Labs  11/18/14 1255  HGB 10.5*    Recent Labs  11/18/14 1255  WBC 7.3  RBC 3.84*  HCT 33.4*  PLT 378    Recent Labs  11/18/14 1255  NA 140  K 3.8  CL 104  CO2 23  BUN 15  CREATININE 0.73  GLUCOSE 86  CALCIUM 9.2    Recent Labs  11/18/14 1255  INR 1.14    Neurologically intact  Assessment/Plan: 1 Day Post-Op Procedure(s) (LRB): Right Total Hip Arthroplasty Femoral Revision-Anteior approach (Right) Up with therapy  SL IV   Labs pending from this AM draw  YATES,MARK C 11/19/2014, 7:51 AM

## 2014-11-19 NOTE — Care Management Utilization Note (Signed)
Utilization review completed.  

## 2014-11-19 NOTE — Brief Op Note (Signed)
11/18/2014  2:04 AM  PATIENT:  Kathy Dawson  59 y.o. female  PRE-OPERATIVE DIAGNOSIS:  Right Total Hip Arthroplasty Femoral Malposition  POST-OPERATIVE DIAGNOSIS:  Right Total Hip Arthroplasty Femoral Malposition  PROCEDURE:  Procedure(s): Right Total Hip Arthroplasty Femoral Revision-Anteior approach  SURGEON:  Surgeon(s): Marybelle Killings, MD  PHYSICIAN ASSISTANT: james m. Owens pa-c  ANESTHESIA:   general  EBL:  Total I/O In: 300 [I.V.:300] Out: -   DRAINS: none   SPECIMEN:  No Specimen  DISPOSITION OF SPECIMEN:  N/A  TOURNIQUET:  * No tourniquets in log *  PATIENT DISPOSITION:  PACU - hemodynamically stable.

## 2014-11-19 NOTE — Care Management Note (Signed)
CARE MANAGEMENT NOTE 11/19/2014  Patient:  Kathy Dawson, Kathy Dawson   Account Number:  [id number]  Date Initiated:  11/19/2014  Documentation initiated by:  Ricki Miller  Subjective/Objective Assessment:   59 yr old female admitted with  right total hip arthroplasty. Patient under went a right total hip revision.     Action/Plan:   Anticipated DC Date:  11/20/2014   Anticipated DC Plan:  [place]  CM consult      Choice offered to / List presented to:             Status of service:  In process, will continue to follow Medicare Important Message given?   (If response is "NO", the following Medicare IM given date fields will be blank) Date Medicare IM given:   Medicare IM given by:   Date Additional Medicare IM given:   Additional Medicare IM given by:    Discharge Disposition:  HOME/SELF CARE  Per UR Regulation:  Reviewed for med. necessity/level of care/duration of stay  If discussed at [place] of Stay Meetings, dates discussed:    Comments:

## 2014-11-20 ENCOUNTER — Inpatient Hospital Stay (HOSPITAL_COMMUNITY): Payer: BLUE CROSS/BLUE SHIELD

## 2014-11-20 LAB — CBC
HEMATOCRIT: 23.6 % — AB (ref 36.0–46.0)
HEMOGLOBIN: 7.5 g/dL — AB (ref 12.0–15.0)
MCH: 27.9 pg (ref 26.0–34.0)
MCHC: 31.8 g/dL (ref 30.0–36.0)
MCV: 87.7 fL (ref 78.0–100.0)
Platelets: 329 10*3/uL (ref 150–400)
RBC: 2.69 MIL/uL — ABNORMAL LOW (ref 3.87–5.11)
RDW: 14 % (ref 11.5–15.5)
WBC: 6.1 10*3/uL (ref 4.0–10.5)

## 2014-11-20 MED ORDER — OXYCODONE HCL 5 MG PO TABS: 5.0000 mg | ORAL_TABLET | ORAL | Status: DC | PRN

## 2014-11-20 MED ORDER — DOCUSATE SODIUM 100 MG PO CAPS: 100.0000 mg | ORAL_CAPSULE | Freq: Two times a day (BID) | ORAL | Status: DC

## 2014-11-20 NOTE — Progress Notes (Signed)
Patient states she felt a "pop" when scooting over in bed and is now complaining of pain/burning in hip that radiates down the leg. Patient states leg feels tight, "like too much exercise". MD on call, Ninfa Linden,  paged and made aware of situation. Orders placed for portable x-ray A/P and lateral of right hip. Will continue to monitor.

## 2014-11-20 NOTE — Progress Notes (Signed)
Patient ID: Kathy Dawson, female   DOB: 02-14-1956, 59 y.o.   MRN: [id number] ephisode when she moved with sensation of fluid moving in her hip with increased pain now relieved in supine position. No pain with hip ROM. LL equal. Pulses and sciatic intact.  Xray STAT portable looks good- no change. Discharge home as planned. They have my cell phone # .

## 2014-11-20 NOTE — Progress Notes (Signed)
Physical Therapy Treatment Patient Details Name: Kathy Dawson MRN: [id number] DOB: [birth year] Today's Date: 11/20/2014    History of Present Illness 59 y.o. female admitted to [facility] on 11/18/14 s/p R direct anterior THA revision. She has significant PMHx of anemia, lumbosacral DJD, R TKA, and R THA 11/04/2014.    PT Comments    Pt is POD #2 and is ready for d/c.  She did not want to participate in mobility with PT this AM as she feels ready to go (she did just recently have this same surgery).  HEP handout given and stairs verbally reviewed (I had to correct which leg she reported she would lead up and down with).  PT will continue to follow acutely until d/c confirmed.   Follow Up Recommendations  Home health PT;Supervision for mobility/OOB     Equipment Recommendations  None recommended by PT    Recommendations for Other Services   NA     Precautions / Restrictions Precautions Precautions: Fall Restrictions Weight Bearing Restrictions: Yes RLE Weight Bearing: Weight bearing as tolerated    Mobility  Bed Mobility Overal bed mobility: Modified Independent             General bed mobility comments: no rails, HOB not elevated  Transfers Overall transfer level: Modified independent Equipment used: Rolling walker (2 wheeled)             General transfer comment: patient states she is feeling much better than she was yesterday         Balance Overall balance assessment: No apparent balance deficits (not formally assessed)                                  Cognition Arousal/Alertness: Awake/alert Behavior During Therapy: WFL for tasks assessed/performed Overall Cognitive Status: Within Functional Limits for tasks assessed                         General Comments General comments (skin integrity, edema, etc.): Verbally discussed correct LE sequencing on the stairs and how pt would go up backwards and someone would stabilize the front of her  RW.  HEP handout given and verbally reviewed frequency and how to divide up the exercises.  Also, reinforced WBAT status that was confirmed with MD yesterday.       Pertinent Vitals/Pain Pain Assessment: Faces Pain Score: 5  Faces Pain Scale: Hurts little more Pain Location: right hip Pain Descriptors / Indicators: Aching;Burning Pain Intervention(s): Limited activity within patient's tolerance;Monitored during session;Repositioned;Ice applied    Home Living Family/patient expects to be discharged to:: Private residence Living Arrangements: Spouse/significant other Available Help at Discharge: Family;Available PRN/intermittently (sister plans to stay with her, pt also has daughter who will check on her) Type of Home: House Home Access: Stairs to enter Entrance Stairs-Rails: None Home Layout: Multi-level;Able to live on main level with bedroom/bathroom Home Equipment: Kasandra Knudsen - single point;Walker - 2 wheels;Bedside commode;Shower seat;Shower seat - built in      Prior Function Level of Independence: Independent with assistive device(s)      Comments: RW   PT Goals (current goals can now be found in the care plan section) Acute Rehab PT Goals Patient Stated Goal: go home today Progress towards PT goals: Progressing toward goals    Frequency  7X/week    PT Plan Current plan remains appropriate       End of Session  Activity Tolerance: Patient tolerated treatment well Patient left: in bed;with call bell/phone within reach     Time: 1131-1140 PT Time Calculation (min) (ACUTE ONLY): 9 min  Charges:  $Self Care/Home Management: 8-22                      Uthman Mroczkowski B. Kayanna Mckillop, PT, DPT [phone]   11/20/2014, 11:47 AM

## 2014-11-20 NOTE — Progress Notes (Signed)
Subjective: 2 Days Post-Op Procedure(s) (LRB): Right Total Hip Arthroplasty Femoral Revision-Anteior approach (Right) Patient reports pain as 4 on 0-10 scale.    Objective: Vital signs in last 24 hours: Temp:  [98.3 F (36.8 C)-100.5 F (38.1 C)] 98.5 F (36.9 C) (04/06 0516) Pulse Rate:  [85-97] 96 (04/06 0516) Resp:  [18] 18 (04/06 0516) BP: (108-130)/(60-68) 130/60 mmHg (04/06 0516) SpO2:  [94 %-100 %] 94 % (04/06 0516)  Intake/Output from previous day: 04/05 0701 - 04/06 0700 In: 480 [P.O.:480] Out: -  Intake/Output this shift:     Recent Labs  11/18/14 1255 11/19/14 0700 11/20/14 0525  HGB 10.5* 8.1* 7.5*    Recent Labs  11/19/14 0700 11/20/14 0525  WBC 6.8 6.1  RBC 2.92* 2.69*  HCT 25.5* 23.6*  PLT 354 329    Recent Labs  11/18/14 1255 11/19/14 0700  NA 140 135  K 3.8 4.2  CL 104 100  CO2 23 30  BUN 15 12  CREATININE 0.73 0.66  GLUCOSE 86 106*  CALCIUM 9.2 8.7    Recent Labs  11/18/14 1255  INR 1.14    Neurologically intact  Assessment/Plan: 2 Days Post-Op Procedure(s) (LRB): Right Total Hip Arthroplasty Femoral Revision-Anteior approach (Right) Plan: discharge Home today.    Post op anemia from femoral revision surgery . Ambulatory to BR without symptoms.   Oseias Horsey C 11/20/2014, 7:41 AM

## 2014-11-20 NOTE — Evaluation (Signed)
Occupational Therapy Evaluation Patient Details Name: Kathy Dawson MRN: [id number] DOB: Mar 01, 1956 Today's Date: 11/20/2014    History of Present Illness 59 y.o. female admitted to [facility] on 11/18/14 s/p R direct anterior THA revision. She has significant PMHx of anemia, lumbosacral DJD, R TKA, and R THA 11/04/2014.   Clinical Impression   Patient admitted with above. Patient mod I with functional mobility and min assist with LB ADLs PTA. Patient currently functioning at that baseline (mod I for functional mobility and min assist for LB ADLs). D/C from acute OT services and no additional follow-up OT needs at this time. All appropriate education provided to patient. Please re-order OT if needed.      Follow Up Recommendations  No OT follow up;Supervision - Intermittent    Equipment Recommendations  Other (comment) (sock aid & LH sponge)    Recommendations for Other Services  None at this time   Precautions / Restrictions Precautions Precautions: Fall Restrictions Weight Bearing Restrictions: Yes RLE Weight Bearing: Weight bearing as tolerated      Mobility Bed Mobility Overal bed mobility: Modified Independent General bed mobility comments: no rails, HOB not elevated  Transfers Overall transfer level: Modified independent Equipment used: Rolling walker (2 wheeled)    General transfer comment: patient states she is feeling much better than she was yesterday    Balance Overall balance assessment: No apparent balance deficits (not formally assessed)    ADL Overall ADL's : Needs assistance/impaired Eating/Feeding: Set up;Sitting   Grooming: Modified independent;Standing   Upper Body Bathing: Set up;Sitting   Lower Body Bathing: Minimal assistance;Sit to/from stand;Cueing for safety   Upper Body Dressing : Set up;Sitting   Lower Body Dressing: Minimal assistance;Sit to/from stand;Cueing for safety   Toilet Transfer: Modified Independent;BSC;RW;Ambulation   Toileting-  Clothing Manipulation and Hygiene: Modified independent;Sit to/from stand;Cueing for safety  General ADL Comments: Patient needs assistance with LB ADLs > RLE. Educated patient on use of sock aid and encouraged her to purchase one to increase independence with LB ADLs. Patient overall mod I for functional mobility using RW. Patient states she will have someone available prn for at least then next 2 weeks. No further acute OT needs needed at this time.     Pertinent Vitals/Pain Pain Assessment: 0-10 Pain Score: 5  Pain Location: right hip Pain Descriptors / Indicators: Aching;Sore Pain Intervention(s): Monitored during session;Repositioned     Hand Dominance Right   Extremity/Trunk Assessment Upper Extremity Assessment Upper Extremity Assessment: Overall WFL for tasks assessed   Lower Extremity Assessment Lower Extremity Assessment: Defer to PT evaluation   Cervical / Trunk Assessment Cervical / Trunk Assessment: Other exceptions Cervical / Trunk Exceptions: per chart h/o lumbosacral DJD.    Communication Communication Communication: No difficulties   Cognition Arousal/Alertness: Awake/alert Behavior During Therapy: WFL for tasks assessed/performed Overall Cognitive Status: Within Functional Limits for tasks assessed             Home Living Family/patient expects to be discharged to:: Private residence Living Arrangements: Spouse/significant other Available Help at Discharge: Family;Available PRN/intermittently (sister plans to stay with her, pt also has daughter who will check on her) Type of Home: House Home Access: Stairs to enter [employer] of Steps: ~3 Entrance Stairs-Rails: None Home Layout: Multi-level;Able to live on main level with bedroom/bathroom     Bathroom Shower/Tub: Walk-in shower;Door;Tub/shower unit   [employer]: Standard     Home Equipment: Cane - single point;Walker - 2 wheels;Bedside commode;Shower seat;Shower seat - built in  Prior Functioning/Environment Level of Independence: Independent with assistive device(s)        Comments: RW    OT Diagnosis:  n/a, no further acute OT needs at this time   OT Problem List:   n/a, no further acute OT needs at this time   OT Treatment/Interventions:   n/a, no further acute OT needs at this time   OT Goals(Current goals can be found in the care plan section) Acute Rehab OT Goals Patient Stated Goal: go home today  OT Frequency:   n/a, no further acute OT needs at this time   Barriers to D/C:  None at this time   End of Session Equipment Utilized During Treatment: Rolling walker  Activity Tolerance: Patient tolerated treatment well Patient left: in chair;with call bell/phone within reach   Time: 0623-7628 OT Time Calculation (min): 24 min Charges:  OT General Charges $OT Visit: 1 Procedure OT Evaluation $Initial OT Evaluation Tier I: 1 Procedure OT Treatments $Self Care/Home Management : 8-22 mins  Pihu Basil , MS, OTR/L, CLT Pager: [phone]  11/20/2014, 10:29 AM

## 2014-11-20 NOTE — Progress Notes (Signed)
Discharge instructions and prescriptions reviewed with patient. Patient denies questions or concerns at this time. IV removed with no complications. Dressing changed and extra supplies given to patient. Patient has all necessary equipment from previous surgery and is not set up to receive physical therapy at home. Patient dressed and awaiting ride for d/c home.

## 2014-11-20 NOTE — Discharge Instructions (Signed)
Total Hip Replacement, Care After °Refer to this sheet in the next few weeks. These instructions provide you with information on caring for yourself after your procedure. Your health care provider may also give you specific instructions. Your treatment has been planned according to the most current medical practices, but problems sometimes occur. Call your health care provider if you have any problems or questions after your procedure. °HOME CARE INSTRUCTIONS  °Your health care provider will give you specific precautions for certain types of movement. Additional instructions include: °· Take medicines only as directed by your health care provider. °· Take quick showers (3-5 min) rather than bathe until your health care provider tells you that you can take baths again. °· Avoid lifting until your health care provider instructs you otherwise. °· Use a raised toilet seat and avoid sitting in low chairs as instructed by your health care provider. °· Use crutches or a walker as instructed by your health care provider. °SEEK MEDICAL CARE IF: °· You have difficulty breathing. °· You have drainage, redness, or swelling at your incision site. °· You have a bad smell coming from your incision site. °· You have persistent bleeding from your incision site. °· Your incision breaks open after sutures (stitches) or staples have been removed. °· You have a fever. °SEEK IMMEDIATE MEDICAL CARE IF:  °· You have a rash. °· You have pain or swelling in your calf or thigh. °· You have shortness of breath or chest pain. °MAKE SURE YOU: °· Understand these instructions. °· Will watch your condition. °· Will get help if you are not doing well or get worse. °Document Released: 02/19/2005 Document Revised: 12/17/2013 Document Reviewed: 10/03/2013 °ExitCare® Patient Information ©2015 [employer]. This information is not intended to replace advice given to you by your health care provider. Make sure you discuss any questions you have with  your health care provider. ° °

## 2014-11-27 NOTE — Discharge Summary (Signed)
Patient ID: Kathy Dawson MRN: [id number] DOB/AGE: Dec 26, 1955 59 y.o.  Admit date: 11/04/2014 Discharge date: 11/27/2014  Admission Diagnoses:  Active Problems:   History of total hip replacement   Discharge Diagnoses:  Active Problems:   History of total hip replacement  status post Procedure(s): TOTAL HIP ARTHROPLASTY ANTERIOR APPROACH, Possible Local Bonegrafting Acetabular Cyst  Past Medical History  Diagnosis Date  . GERD (gastroesophageal reflux disease)   . Knee pain   . Anemia   . DJD (degenerative joint disease), lumbosacral   . Complication of anesthesia 14 yrs. ago    woke up during the appendectomy    Surgeries: Procedure(s): TOTAL HIP ARTHROPLASTY ANTERIOR APPROACH, Possible Local Bonegrafting Acetabular Cyst on 11/04/2014   Consultants:    Discharged Condition: Improved  Hospital Course: Kathy Dawson is an 59 y.o. female who was admitted 11/04/2014 for operative treatment of hip djd. Patient failed conservative treatments (please see the history and physical for the specifics) and had severe unremitting pain that affects sleep, daily activities and work/hobbies. After pre-op clearance, the patient was taken to the operating room on 11/04/2014 and underwent  Procedure(s): TOTAL HIP ARTHROPLASTY ANTERIOR APPROACH, Possible Local Bonegrafting Acetabular Cyst.    Patient was given perioperative antibiotics:  Anti-infectives    Start     Dose/Rate Route Frequency Ordered Stop   11/04/14 1050  ceFAZolin (ANCEF) 2-3 GM-% IVPB SOLR    CommentsLaurance Flatten, Kristi   : cabinet override      11/04/14 1050 11/04/14 1232   11/03/14 1135  ceFAZolin (ANCEF) IVPB 2 g/50 mL premix  Status:  Discontinued     2 g 100 mL/hr over 30 Minutes Intravenous On call to O.R. 11/03/14 1135 11/04/14 2014       Patient was given sequential compression devices and early ambulation to prevent DVT.   Patient benefited maximally from hospital stay and there were no complications. At the  time of discharge, the patient was urinating/moving their bowels without difficulty, tolerating a regular diet, pain is controlled with oral pain medications and they have been cleared by PT/OT.   Recent vital signs: No data found.    Recent laboratory studies: No results for input(s): WBC, HGB, HCT, PLT, NA, K, CL, CO2, BUN, CREATININE, GLUCOSE, INR, CALCIUM in the last 72 hours.  Invalid input(s): PT, 2   Discharge Medications:     Medication List    STOP taking these medications        acetaminophen 500 MG tablet  Commonly known as:  TYLENOL     amoxicillin-clarithromycin-lansoprazole combo pack  Commonly known as:  PREVPAC     diphenhydrAMINE 25 MG tablet  Commonly known as:  BENADRYL     HYDROcodone-acetaminophen 5-325 MG per tablet  Commonly known as:  NORCO/VICODIN     OVER THE COUNTER MEDICATION      TAKE these medications        aspirin EC 325 MG tablet  Take 1 tablet (325 mg total) by mouth daily.     betamethasone dipropionate 0.05 % cream  Commonly known as:  DIPROLENE  Apply 1 application topically 2 (two) times daily.     calcium carbonate 500 MG chewable tablet  Commonly known as:  TUMS - dosed in mg elemental calcium  Chew 1 tablet by mouth daily as needed for heartburn. 750 mg     lubiprostone 8 MCG capsule  Commonly known as:  AMITIZA  Take 1 capsule (8 mcg total) by mouth 2 (two)  times daily with a meal.     methocarbamol 500 MG tablet  Commonly known as:  ROBAXIN  Take 1 tablet (500 mg total) by mouth every 6 (six) hours as needed for muscle spasms.     pantoprazole 40 MG tablet  Commonly known as:  PROTONIX  Take 1 tablet (40 mg total) by mouth daily. Take 30 minutes prior to breakfast     Vitamin D3 1000 UNITS Caps  Take 1 capsule by mouth daily.        Diagnostic Studies: Dg Hip Port Unilat With Pelvis 1v Right  11/20/2014   CLINICAL DATA:  heard a pop of right hip while moving  EXAM: RIGHT HIP (WITH PELVIS) 1 VIEW PORTABLE   COMPARISON:  11/18/2014  FINDINGS: Single portable frontal view of the right hip submitted. Again noted right hip prosthesis anatomic alignment. Stable fragmented osteophyte or fracture in right superior acetabulum.  IMPRESSION: Right hip prosthesis in anatomic alignment. Again noted fragmented osteophyte or fracture in right superior acetabulum.   Electronically Signed   By: Lahoma Crocker M.D.   On: 11/20/2014 16:09   Dg Hip Operative Unilat With Pelvis Right  11/18/2014   CLINICAL DATA:  Total right hip arthroplasty.  EXAM: OPERATIVE right HIP (WITH PELVIS IF PERFORMED)  VIEWS  TECHNIQUE: Fluoroscopic spot image(s) were submitted for interpretation post-operatively.  COMPARISON:  11/04/2014  FINDINGS: The femoral and acetabular components appear well CT head without complicating features.  IMPRESSION: Right total hip arthroplasty in good position without complicating features.   Electronically Signed   By: Marijo Sanes M.D.   On: 11/18/2014 17:35   Dg Hip Operative Unilat With Pelvis Right  11/04/2014   CLINICAL DATA:  Total right hip arthroplasty  EXAM: OPERATIVE right HIP (WITH PELVIS IF PERFORMED) 1 VIEWS  TECHNIQUE: Fluoroscopic spot image(s) were submitted for interpretation post-operatively.  COMPARISON:  None.  FINDINGS: Two views of the right hip submitted. There is right hip prosthesis in anatomic alignment.  IMPRESSION: Right hip prosthesis anatomic alignment.  Fluoroscopy time 26 seconds.   Electronically Signed   By: Lahoma Crocker M.D.   On: 11/04/2014 14:43   Dg Hip Unilat With Pelvis 2-3 Views Right  11/18/2014   CLINICAL DATA:  Right total hip arthroplasty  EXAM: RIGHT HIP (WITH PELVIS) 2-3 VIEWS  COMPARISON:  Intraoperative spot films.  FINDINGS: Well seated components of a total hip arthroplasty. Findings suspicious for a superior and lateral acetabular fracture.  IMPRESSION: Well seated components of a total hip arthroplasty.  Possible superior and lateral acetabular fracture. A fractured spur  is also possible.  These results will be called to the ordering clinician or representative by the Radiologist Assistant, and communication documented in the PACS or zVision Dashboard.   Electronically Signed   By: Marijo Sanes M.D.   On: 11/18/2014 18:49        Discharge Instructions    Call MD / Call 911    Complete by:  As directed   If you experience chest pain or shortness of breath, CALL 911 and be transported to the hospital emergency room.  If you develope a fever above 101 F, pus (white drainage) or increased drainage or redness at the wound, or calf pain, call your surgeon's office.     Change dressing    Complete by:  As directed   Daily dressing changes with 4x4 gauze and tape.     Constipation Prevention    Complete by:  As directed   Drink  plenty of fluids.  Prune juice may be helpful.  You may use a stool softener, such as Colace (over the counter) 100 mg twice a day.  Use MiraLax (over the counter) for constipation as needed.     Diet - low sodium heart healthy    Complete by:  As directed      Discharge instructions    Complete by:  As directed   Ok to shower, but no tub soaking.  Do not apply any creams or ointments to incision.  Continue physical therapy protocol.     Driving restrictions    Complete by:  As directed   No driving until further notice.     Follow the hip precautions as taught in Physical Therapy    Complete by:  As directed      Increase activity slowly as tolerated    Complete by:  As directed      Lifting restrictions    Complete by:  As directed   No lifting until further notice.     TED hose    Complete by:  As directed   Wear ted hose until 2 weeks postop.           Follow-up Information    Follow up with Marybelle Killings, MD.   Specialty:  Orthopedic Surgery   Why:  need return office visit 2 weeks postop   Contact information:   [place] [address] [phone]       Discharge Plan:  discharge to  home  Disposition:     Signed: Lanae Crumbly  11/27/2014, 12:56 PM

## 2014-11-28 NOTE — Discharge Summary (Signed)
Patient ID: Kathy Dawson MRN: [id number] DOB/AGE: 20-Aug-1955 48 y.o.  Admit date: 11/18/2014 Discharge date: 11/28/2014  Admission Diagnoses:  Active Problems:   S/P revision of total hip   Discharge Diagnoses:  Active Problems:   S/P revision of total hip  status post Procedure(s): Right Total Hip Arthroplasty Femoral Revision-Anteior approach  Past Medical History  Diagnosis Date  . GERD (gastroesophageal reflux disease)   . Knee pain   . Anemia   . DJD (degenerative joint disease), lumbosacral   . Complication of anesthesia 14 yrs. ago    woke up during the appendectomy    Surgeries: Procedure(s): Right Total Hip Arthroplasty Femoral Revision-Anteior approach on 11/18/2014   Consultants:    Discharged Condition: Improved  Hospital Course: Kathy Dawson is an 59 y.o. female who was admitted 11/18/2014 for operative treatment of right hip failed hardware. Patient failed conservative treatments (please see the history and physical for the specifics) and had severe unremitting pain that affects sleep, daily activities and work/hobbies. After pre-op clearance, the patient was taken to the operating room on 11/18/2014 and underwent  Procedure(s): Right Total Hip Arthroplasty Femoral Revision-Anteior approach.    Patient was given perioperative antibiotics:  Anti-infectives    Start     Dose/Rate Route Frequency Ordered Stop   11/18/14 1400  ceFAZolin (ANCEF) IVPB 2 g/50 mL premix  Status:  Discontinued     2 g 100 mL/hr over 30 Minutes Intravenous On call to O.R. 11/18/14 1235 11/18/14 2001       Patient was given sequential compression devices and early ambulation to prevent DVT.   Patient benefited maximally from hospital stay and there were no complications. At the time of discharge, the patient was urinating/moving their bowels without difficulty, tolerating a regular diet, pain is controlled with oral pain medications and they have been cleared by PT/OT.   Recent  vital signs: No data found.    Recent laboratory studies: No results for input(s): WBC, HGB, HCT, PLT, NA, K, CL, CO2, BUN, CREATININE, GLUCOSE, INR, CALCIUM in the last 72 hours.  Invalid input(s): PT, 2   Discharge Medications:     Medication List    STOP taking these medications        gabapentin 300 MG capsule  Commonly known as:  NEURONTIN     oxyCODONE-acetaminophen 7.5-325 MG per tablet  Commonly known as:  PERCOCET      TAKE these medications        aspirin EC 325 MG tablet  Take 1 tablet (325 mg total) by mouth daily.     betamethasone dipropionate 0.05 % cream  Commonly known as:  DIPROLENE  Apply 1 application topically 2 (two) times daily.     calcium carbonate 500 MG chewable tablet  Commonly known as:  TUMS - dosed in mg elemental calcium  Chew 1 tablet by mouth daily as needed for heartburn. 750 mg     docusate sodium 100 MG capsule  Commonly known as:  COLACE  Take 1 capsule (100 mg total) by mouth 2 (two) times daily.     lubiprostone 8 MCG capsule  Commonly known as:  AMITIZA  Take 1 capsule (8 mcg total) by mouth 2 (two) times daily with a meal.     methocarbamol 500 MG tablet  Commonly known as:  ROBAXIN  Take 1 tablet (500 mg total) by mouth every 6 (six) hours as needed for muscle spasms.     oxyCODONE 5 MG immediate release  tablet  Commonly known as:  Oxy IR/ROXICODONE  Take 1-2 tablets (5-10 mg total) by mouth every 3 (three) hours as needed for severe pain or breakthrough pain.     pantoprazole 40 MG tablet  Commonly known as:  PROTONIX  Take 1 tablet (40 mg total) by mouth daily. Take 30 minutes prior to breakfast     Vitamin D3 1000 UNITS Caps  Take 1 capsule by mouth daily.        Diagnostic Studies: Dg Hip Port Unilat With Pelvis 1v Right  11/20/2014   CLINICAL DATA:  heard a pop of right hip while moving  EXAM: RIGHT HIP (WITH PELVIS) 1 VIEW PORTABLE  COMPARISON:  11/18/2014  FINDINGS: Single portable frontal view of the right  hip submitted. Again noted right hip prosthesis anatomic alignment. Stable fragmented osteophyte or fracture in right superior acetabulum.  IMPRESSION: Right hip prosthesis in anatomic alignment. Again noted fragmented osteophyte or fracture in right superior acetabulum.   Electronically Signed   By: Lahoma Crocker M.D.   On: 11/20/2014 16:09   Dg Hip Operative Unilat With Pelvis Right  11/18/2014   CLINICAL DATA:  Total right hip arthroplasty.  EXAM: OPERATIVE right HIP (WITH PELVIS IF PERFORMED)  VIEWS  TECHNIQUE: Fluoroscopic spot image(s) were submitted for interpretation post-operatively.  COMPARISON:  11/04/2014  FINDINGS: The femoral and acetabular components appear well CT head without complicating features.  IMPRESSION: Right total hip arthroplasty in good position without complicating features.   Electronically Signed   By: Marijo Sanes M.D.   On: 11/18/2014 17:35   Dg Hip Operative Unilat With Pelvis Right  11/04/2014   CLINICAL DATA:  Total right hip arthroplasty  EXAM: OPERATIVE right HIP (WITH PELVIS IF PERFORMED) 1 VIEWS  TECHNIQUE: Fluoroscopic spot image(s) were submitted for interpretation post-operatively.  COMPARISON:  None.  FINDINGS: Two views of the right hip submitted. There is right hip prosthesis in anatomic alignment.  IMPRESSION: Right hip prosthesis anatomic alignment.  Fluoroscopy time 26 seconds.   Electronically Signed   By: Lahoma Crocker M.D.   On: 11/04/2014 14:43   Dg Hip Unilat With Pelvis 2-3 Views Right  11/18/2014   CLINICAL DATA:  Right total hip arthroplasty  EXAM: RIGHT HIP (WITH PELVIS) 2-3 VIEWS  COMPARISON:  Intraoperative spot films.  FINDINGS: Well seated components of a total hip arthroplasty. Findings suspicious for a superior and lateral acetabular fracture.  IMPRESSION: Well seated components of a total hip arthroplasty.  Possible superior and lateral acetabular fracture. A fractured spur is also possible.  These results will be called to the ordering clinician or  representative by the Radiologist Assistant, and communication documented in the PACS or zVision Dashboard.   Electronically Signed   By: Marijo Sanes M.D.   On: 11/18/2014 18:49          Follow-up Information    Follow up with YATES,MARK C, MD In 1 week.   Specialty:  Orthopedic Surgery   Contact information:   [address] [place] [address] [phone]       Discharge Plan:  discharge to home  Disposition:     Signed: Lanae Crumbly 11/28/2014, 9:28 AM

## 2014-12-10 ENCOUNTER — Telehealth: Payer: Self-pay | Admitting: Internal Medicine

## 2014-12-10 NOTE — Telephone Encounter (Signed)
Noted  

## 2014-12-10 NOTE — Telephone Encounter (Signed)
A nurse Caren Griffins) from [facility] called to say that this patient is under their case management care. Any questions call [phone] ext [phone]

## 2014-12-24 ENCOUNTER — Other Ambulatory Visit (HOSPITAL_COMMUNITY): Payer: Self-pay

## 2014-12-24 DIAGNOSIS — D509 Iron deficiency anemia, unspecified: Secondary | ICD-10-CM

## 2014-12-27 ENCOUNTER — Other Ambulatory Visit (HOSPITAL_COMMUNITY): Payer: BC Managed Care – PPO

## 2014-12-27 ENCOUNTER — Ambulatory Visit (HOSPITAL_COMMUNITY): Payer: BLUE CROSS/BLUE SHIELD | Admitting: Hematology & Oncology

## 2014-12-31 ENCOUNTER — Other Ambulatory Visit: Payer: Self-pay | Admitting: Orthopaedic Surgery

## 2014-12-31 DIAGNOSIS — M79604 Pain in right leg: Secondary | ICD-10-CM

## 2014-12-31 DIAGNOSIS — M79605 Pain in left leg: Principal | ICD-10-CM

## 2015-01-01 ENCOUNTER — Ambulatory Visit
Admission: RE | Admit: 2015-01-01 | Discharge: 2015-01-01 | Disposition: A | Discharge status: Home / Self Care | Payer: BLUE CROSS/BLUE SHIELD | Source: Ambulatory Visit | Attending: Orthopaedic Surgery | Admitting: Orthopaedic Surgery

## 2015-01-01 DIAGNOSIS — M79604 Pain in right leg: Secondary | ICD-10-CM

## 2015-01-01 DIAGNOSIS — M79605 Pain in left leg: Principal | ICD-10-CM

## 2015-01-16 ENCOUNTER — Other Ambulatory Visit (HOSPITAL_COMMUNITY): Payer: BLUE CROSS/BLUE SHIELD

## 2015-01-16 ENCOUNTER — Ambulatory Visit (HOSPITAL_COMMUNITY): Payer: BLUE CROSS/BLUE SHIELD | Admitting: Hematology & Oncology

## 2015-01-21 DIAGNOSIS — Z471 Aftercare following joint replacement surgery: Secondary | ICD-10-CM | POA: Insufficient documentation

## 2015-01-28 ENCOUNTER — Encounter (HOSPITAL_BASED_OUTPATIENT_CLINIC_OR_DEPARTMENT_OTHER): Payer: BLUE CROSS/BLUE SHIELD

## 2015-01-28 ENCOUNTER — Encounter (HOSPITAL_COMMUNITY): Payer: Self-pay | Admitting: Hematology & Oncology

## 2015-01-28 ENCOUNTER — Other Ambulatory Visit (HOSPITAL_COMMUNITY): Payer: Self-pay | Admitting: Hematology & Oncology

## 2015-01-28 ENCOUNTER — Encounter (HOSPITAL_COMMUNITY): Payer: BLUE CROSS/BLUE SHIELD | Attending: Hematology & Oncology | Admitting: Hematology & Oncology

## 2015-01-28 VITALS — BP 120/78 | HR 77 | Temp 98.7°F | Resp 16 | Wt 203.3 lb

## 2015-01-28 DIAGNOSIS — Z1231 Encounter for screening mammogram for malignant neoplasm of breast: Secondary | ICD-10-CM

## 2015-01-28 DIAGNOSIS — D509 Iron deficiency anemia, unspecified: Secondary | ICD-10-CM

## 2015-01-28 DIAGNOSIS — K625 Hemorrhage of anus and rectum: Secondary | ICD-10-CM | POA: Insufficient documentation

## 2015-01-28 DIAGNOSIS — Z1239 Encounter for other screening for malignant neoplasm of breast: Secondary | ICD-10-CM | POA: Insufficient documentation

## 2015-01-28 LAB — CBC WITH DIFFERENTIAL/PLATELET
Basophils Absolute: 0 10*3/uL (ref 0.0–0.1)
Basophils Relative: 0 % (ref 0–1)
Eosinophils Absolute: 0.2 10*3/uL (ref 0.0–0.7)
Eosinophils Relative: 3 % (ref 0–5)
HCT: 36.2 % (ref 36.0–46.0)
HEMOGLOBIN: 11.1 g/dL — AB (ref 12.0–15.0)
LYMPHS ABS: 2.7 10*3/uL (ref 0.7–4.0)
LYMPHS PCT: 40 % (ref 12–46)
MCH: 23.8 pg — AB (ref 26.0–34.0)
MCHC: 30.7 g/dL (ref 30.0–36.0)
MCV: 77.5 fL — AB (ref 78.0–100.0)
MONOS PCT: 7 % (ref 3–12)
Monocytes Absolute: 0.5 10*3/uL (ref 0.1–1.0)
NEUTROS ABS: 3.3 10*3/uL (ref 1.7–7.7)
Neutrophils Relative %: 50 % (ref 43–77)
Platelets: 387 10*3/uL (ref 150–400)
RBC: 4.67 MIL/uL (ref 3.87–5.11)
RDW: 15.2 % (ref 11.5–15.5)
WBC: 6.8 10*3/uL (ref 4.0–10.5)

## 2015-01-28 LAB — FERRITIN: Ferritin: 16 ng/mL (ref 11–307)

## 2015-01-28 NOTE — Patient Instructions (Signed)
[place] at [facility] Discharge Instructions  RECOMMENDATIONS MADE BY THE CONSULTANT AND ANY TEST RESULTS WILL BE SENT TO YOUR REFERRING PHYSICIAN.  Exam and discussion by Dr. Whitney Muse. Will let you know if any concerns with your labs. Report increased fatigue, shortness of breath, or other concerns.  Follow-up in 6 months with labs and office visit.  Thank you for choosing [place] at [facility] to provide your oncology and hematology care.  To afford each patient quality time with our provider, please arrive at least 15 minutes before your scheduled appointment time.    You need to re-schedule your appointment should you arrive 10 or more minutes late.  We strive to give you quality time with our providers, and arriving late affects you and other patients whose appointments are after yours.  Also, if you no show three or more times for appointments you may be dismissed from the clinic at the providers discretion.     Again, thank you for choosing [facility].  Our hope is that these requests will decrease the amount of time that you wait before being seen by our physicians.       _____________________________________________________________  Should you have questions after your visit to [facility], please contact our office at [phone] between the hours of 8:30 a.m. and 4:30 p.m.  Voicemails left after 4:30 p.m. will not be returned until the following business day.  For prescription refill requests, have your pharmacy contact our office.

## 2015-01-28 NOTE — Progress Notes (Signed)
LABS DRAWN

## 2015-01-28 NOTE — Progress Notes (Signed)
[place] at [facility] Progress Note  Patient Care Team: Elsie Lincoln, MD as PCP - General (Family Medicine) Daneil Dolin, MD (Gastroenterology) Marybelle Killings, MD as Consulting Physician (Orthopedic Surgery)  CHIEF COMPLAINTS/PURPOSE OF CONSULTATION:  Anemia Last EGD Feb. 2016 with Dr. Sydell Axon Iron deficiency, history of IV iron replacement Colonoscopy on 12/07/2011 with sigmoid diverticulosis, polyp, internal hemorrhoids   HISTORY OF PRESENTING ILLNESS:  Kathy Dawson 59 y.o. female is here because of iron deficiency anemia.  She is here alone today. She has a history of a R TKA and revision at [facility] two years ago. She recently had a R total hip in [place]. She notes she had a transfusion after her hip replacement. She is not sure of how many units of PRBC's she was given.   She is up to date on her colonoscopies. She has not had a mammogram in several years.   She denies a change in her bowel habits. She denies BRBPR or black or tarry stool.  MEDICAL HISTORY:  Past Medical History  Diagnosis Date  . GERD (gastroesophageal reflux disease)   . Knee pain   . Anemia   . DJD (degenerative joint disease), lumbosacral   . Complication of anesthesia 14 yrs. ago    woke up during the appendectomy    SURGICAL HISTORY: Past Surgical History  Procedure Laterality Date  . Tubal ligation    . Appendectomy    . Knee surgery    . Bilateral foot surgery    . Colonoscopy  12/08/2011    [id number] hemorrhoids/Sigmoid diverticulosis. Colonic polyp (tubular adenoma). Surveillance 2018  . 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 Esophagogastroduodenoscopy N/A 09/16/2014    Procedure: ESOPHAGOGASTRODUODENOSCOPY (EGD);  Surgeon: Daneil Dolin, MD;  [facility];  Service: Endoscopy;  Laterality: N/A;  1030am  . Maloney dilation N/A 09/16/2014    Procedure: Venia Minks DILATION;  Surgeon: Daneil Dolin, MD;  [facility];  Service: Endoscopy;   Laterality: N/A;  . Total hip arthroplasty Right 11/04/2014    Procedure: TOTAL HIP ARTHROPLASTY ANTERIOR APPROACH, Possible Local Bonegrafting Acetabular Cyst;  Surgeon: Marybelle Killings, MD;  Location: [place];  Service: Orthopedics;  Laterality: Right;  . Anterior hip revision Right 11/18/2014    Procedure: Right Total Hip Arthroplasty Femoral Revision-Anteior approach;  Surgeon: Marybelle Killings, MD;  Location: [place];  Service: Orthopedics;  Laterality: Right;    SOCIAL HISTORY: History   Social History  . Marital Status: Married    Spouse Name: N/A  . Number of Children: N/A  . Years of Education: N/A   Occupational History  . tobacco farmer    Social History Main Topics  . Smoking status: Former Smoker -- 0.50 packs/day for 10 years    Types: Cigarettes  . Smokeless tobacco: Former Systems developer    Quit date: 08/26/2006  . Alcohol Use: No  . Drug Use: No  . Sexual Activity: Not on file   Other Topics Concern  . Not on file   Social History Narrative    FAMILY HISTORY: Family History  Problem Relation Age of Onset  . Colon polyps Father   . Cancer Father   . Colon cancer Neg Hx    indicated that her mother is alive. She indicated that her father is deceased.  Father is deceased due to stomach cancer.  ALLERGIES:  is allergic to clindamycin/lincomycin; prochlorperazine; prochlorperazine edisylate; and propoxyphene n-acetaminophen.  MEDICATIONS:  Current Outpatient  Prescriptions  Medication Sig Dispense Refill  . acetaminophen (TYLENOL) 500 MG tablet Take 500 mg by mouth every 6 (six) hours as needed.    . calcium carbonate (TUMS - DOSED IN MG ELEMENTAL CALCIUM) 500 MG chewable tablet Chew 1 tablet by mouth daily as needed for heartburn. 750 mg    . Cholecalciferol (VITAMIN D3) 1000 UNITS CAPS Take 1 capsule by mouth daily.    . diphenhydrAMINE (BENADRYL) 50 MG capsule Take 50 mg by mouth every 6 (six) hours as needed.    . docusate sodium (COLACE) 100 MG capsule Take 1 capsule (100  mg total) by mouth 2 (two) times daily. 10 capsule 0  . Lemon Oil OIL Take 3 drops by mouth 2 (two) times daily.    . pantoprazole (PROTONIX) 40 MG tablet Take 1 tablet (40 mg total) by mouth daily. Take 30 minutes prior to breakfast 90 tablet 3  . betamethasone dipropionate (DIPROLENE) 0.05 % cream Apply 1 application topically 2 (two) times daily.     No current facility-administered medications for this visit.    Review of Systems  Constitutional: Positive for malaise/fatigue.       Fatigue due to low iron levels and restless sleep.  Cardiovascular: Positive for leg swelling.       Swelling in her ankles.  Psychiatric/Behavioral: The patient is not nervous/anxious.    14 point ROS was done and is otherwise as detailed above or in HPI   PHYSICAL EXAMINATION: ECOG PERFORMANCE STATUS: 0 - Asymptomatic  Filed Vitals:   01/28/15 1017  BP: 120/78  Pulse: 77  Temp: 98.7 F (37.1 C)  Resp: 16   Filed Weights   01/28/15 1017  Weight: 203 lb 4.8 oz (92.216 kg)     Physical Exam  Constitutional: She is oriented to person, place, and time and well-developed, well-nourished, and in no distress.  HENT:  Head: Normocephalic and atraumatic.  Mouth/Throat: Oropharynx is clear and moist.  Eyes: Conjunctivae and EOM are normal. Pupils are equal, round, and reactive to light.  Neck: Normal range of motion. Neck supple.  Cardiovascular: Normal rate, regular rhythm and normal heart sounds.   Pulmonary/Chest: Effort normal and breath sounds normal.  Abdominal: Soft. Bowel sounds are normal.  Musculoskeletal: Normal range of motion. She exhibits edema.  Edema in her ankles, more on the right.  Neurological: She is alert and oriented to person, place, and time.  Skin: Skin is warm and dry.         LABORATORY DATA:  I have reviewed the data as listed Lab Results  Component Value Date   WBC 6.8 01/28/2015   HGB 11.1* 01/28/2015   HCT 36.2 01/28/2015   MCV 77.5* 01/28/2015   PLT  387 01/28/2015   Ferritin pending  ASSESSMENT & PLAN:  Anemia Last EGD Feb. 2016 with Dr. Sydell Axon Iron deficiency, history of IV iron replacement Colonoscopy on 12/07/2011 with sigmoid diverticulosis, polyp, internal hemorrhoids  She has evidence of a mild microcytic anemia today. She did have a blood transfusion after her recent hip replacement. I suspect she will need additional iron. She has never tried other iron formulations other than Ferrous Sulfate. I discussed with her IV replacement again versus trying a prescription iron such as Niferex which tends to be well tolerated.  She will think about how to replace her iron and we will call her with her ferritin level towards the end of the week.  Referred her for a mammogram here at [facility].  She  will follow up in 6 months with repeats labs and office visit.    Orders Placed This Encounter  Procedures  . MM Digital Screening    Standing Status: Future     Number of Occurrences:      Standing Expiration Date: 01/28/2016    Order Specific Question:  Reason for Exam (SYMPTOM  OR DIAGNOSIS REQUIRED)    Answer:  screening    Order Specific Question:  Is the patient pregnant?    Answer:  No    Order Specific Question:  Preferred imaging location?    Answer:  [facility]  . CBC with Differential    Standing Status: Future     Number of Occurrences:      Standing Expiration Date: 01/28/2016  . Ferritin    Standing Status: Future     Number of Occurrences:      Standing Expiration Date: 01/28/2016    All questions were answered. The patient knows to call the clinic with any problems, questions or concerns.  This document serves as a record of services personally performed by Ancil Linsey, MD. It was created on her behalf by Arlyce Harman, a trained medical scribe. The creation of this record is based on the scribe's personal observations and the provider's statements to them. This document has been checked and  approved by the attending provider.  I have reviewed the above documentation for accuracy and completeness, and I agree with the above.  This note was electronically signed.    Molli Hazard, MD

## 2015-02-03 ENCOUNTER — Telehealth (HOSPITAL_COMMUNITY): Payer: Self-pay | Admitting: Hematology & Oncology

## 2015-02-03 ENCOUNTER — Other Ambulatory Visit (HOSPITAL_COMMUNITY): Payer: Self-pay | Admitting: Oncology

## 2015-02-03 DIAGNOSIS — D509 Iron deficiency anemia, unspecified: Secondary | ICD-10-CM

## 2015-02-03 NOTE — Telephone Encounter (Signed)
AUTH NOT REQUIRED FOR [id number]

## 2015-02-05 ENCOUNTER — Encounter (HOSPITAL_COMMUNITY): Payer: Self-pay

## 2015-02-05 ENCOUNTER — Encounter (HOSPITAL_BASED_OUTPATIENT_CLINIC_OR_DEPARTMENT_OTHER): Payer: BLUE CROSS/BLUE SHIELD

## 2015-02-05 VITALS — BP 137/86 | HR 74 | Temp 97.9°F | Resp 16

## 2015-02-05 DIAGNOSIS — D509 Iron deficiency anemia, unspecified: Secondary | ICD-10-CM

## 2015-02-05 MED ORDER — SODIUM CHLORIDE 0.9 % IV SOLN
125.0000 mg | Freq: Once | INTRAVENOUS | Status: AC
  Administered 2015-02-05: 125 mg via INTRAVENOUS
  Filled 2015-02-05: qty 10

## 2015-02-05 MED ORDER — SODIUM CHLORIDE 0.9 % IV SOLN
INTRAVENOUS | Status: DC
  Administered 2015-02-05: 14:00:00 via INTRAVENOUS

## 2015-02-05 NOTE — Progress Notes (Signed)
Tolerated infusion w/o adverse reaction.  A&ox4; in no distress.  VSS.  Discharged ambulatory.

## 2015-02-05 NOTE — Patient Instructions (Signed)
[place] at [facility] Discharge Instructions  RECOMMENDATIONS MADE BY THE CONSULTANT AND ANY TEST RESULTS WILL BE SENT TO YOUR REFERRING PHYSICIAN.  Iron infusion today. Return weekly for the next two weeks for iron infusions.   Thank you for choosing [place] at [facility] to provide your oncology and hematology care.  To afford each patient quality time with our provider, please arrive at least 15 minutes before your scheduled appointment time.    You need to re-schedule your appointment should you arrive 10 or more minutes late.  We strive to give you quality time with our providers, and arriving late affects you and other patients whose appointments are after yours.  Also, if you no show three or more times for appointments you may be dismissed from the clinic at the providers discretion.     Again, thank you for choosing [facility].  Our hope is that these requests will decrease the amount of time that you wait before being seen by our physicians.       _____________________________________________________________  Should you have questions after your visit to [facility], please contact our office at [phone] between the hours of 8:30 a.m. and 4:30 p.m.  Voicemails left after 4:30 p.m. will not be returned until the following business day.  For prescription refill requests, have your pharmacy contact our office.   Sodium Ferric Gluconate Complex injection What is this medicine? SODIUM FERRIC GLUCONATE COMPLEX (SOE dee um FER ik GLOO koe nate KOM pleks) is an iron replacement. It is used with epoetin therapy to treat low iron levels in patients who are receiving hemodialysis. This medicine may be used for other purposes; ask your health care provider or pharmacist if you have questions. COMMON BRAND NAME(S): Ferrlecit, Nulecit What should I tell my health care provider before I take this medicine? They  need to know if you have any of the following conditions: -anemia that is not from iron deficiency -high levels of iron in the body -an unusual or allergic reaction to iron, benzyl alcohol, other medicines, foods, dyes, or preservatives -pregnant or are trying to become pregnant -breast-feeding How should I use this medicine? This medicine is for infusion into a vein. It is given by a health care professional in a hospital or clinic setting. Talk to your pediatrician regarding the use of this medicine in children. While this drug may be prescribed for children as young as 96 years old for selected conditions, precautions do apply. Overdosage: If you think you have taken too much of this medicine contact a poison control center or emergency room at once. NOTE: This medicine is only for you. Do not share this medicine with others. What if I miss a dose? It is important not to miss your dose. Call your doctor or health care professional if you are unable to keep an appointment. What may interact with this medicine? Do not take this medicine with any of the following medications: -deferoxamine -dimercaprol -other iron products This medicine may also interact with the following medications: -chloramphenicol -deferasirox -medicine for blood pressure like enalapril This list may not describe all possible interactions. Give your health care provider a list of all the medicines, herbs, non-prescription drugs, or dietary supplements you use. Also tell them if you smoke, drink alcohol, or use illegal drugs. Some items may interact with your medicine. What should I watch for while using this medicine? Your condition will be  monitored carefully while you are receiving this medicine. Visit your doctor for check-ups as directed. What side effects may I notice from receiving this medicine? Side effects that you should report to your doctor or health care professional as soon as possible: -allergic  reactions like skin rash, itching or hives, swelling of the face, lips, or tongue -breathing problems -changes in hearing -changes in vision -chills, flushing, or sweating -fast, irregular heartbeat -feeling faint or lightheaded, falls -fever, flu-like symptoms -high or low blood pressure -pain, tingling, numbness in the hands or feet -severe pain in the chest, back, flanks, or groin -swelling of the ankles, feet, hands -trouble passing urine or change in the amount of urine -unusually weak or tired Side effects that usually do not require medical attention (report to your doctor or health care professional if they continue or are bothersome): -cramps -dark colored stools -diarrhea -headache -nausea, vomiting -stomach upset This list may not describe all possible side effects. Call your doctor for medical advice about side effects. You may report side effects to FDA at 1-800-FDA-1088. Where should I keep my medicine? This drug is given in a hospital or clinic and will not be stored at home. NOTE: This sheet is a summary. It may not cover all possible information. If you have questions about this medicine, talk to your doctor, pharmacist, or health care provider.  2015, Elsevier/Gold Standard. (2008-04-03 15:58:57)

## 2015-02-12 ENCOUNTER — Ambulatory Visit (HOSPITAL_COMMUNITY)
Admission: RE | Admit: 2015-02-12 | Discharge: 2015-02-12 | Disposition: A | Discharge status: Home / Self Care | Payer: BLUE CROSS/BLUE SHIELD | Source: Ambulatory Visit | Attending: Hematology & Oncology | Admitting: Hematology & Oncology

## 2015-02-12 DIAGNOSIS — Z1231 Encounter for screening mammogram for malignant neoplasm of breast: Secondary | ICD-10-CM

## 2015-02-13 ENCOUNTER — Encounter (HOSPITAL_BASED_OUTPATIENT_CLINIC_OR_DEPARTMENT_OTHER): Payer: BLUE CROSS/BLUE SHIELD

## 2015-02-13 ENCOUNTER — Other Ambulatory Visit (HOSPITAL_COMMUNITY): Payer: Self-pay | Admitting: Oncology

## 2015-02-13 VITALS — BP 104/64 | HR 78 | Temp 98.2°F | Resp 18

## 2015-02-13 DIAGNOSIS — D509 Iron deficiency anemia, unspecified: Secondary | ICD-10-CM

## 2015-02-13 MED ORDER — POLYSACCHARIDE IRON COMPLEX 150 MG PO CAPS: 150.0000 mg | ORAL_CAPSULE | Freq: Two times a day (BID) | ORAL | Status: DC

## 2015-02-13 MED ORDER — SODIUM CHLORIDE 0.9 % IV SOLN
125.0000 mg | Freq: Once | INTRAVENOUS | Status: AC
  Administered 2015-02-13: 125 mg via INTRAVENOUS
  Filled 2015-02-13: qty 10

## 2015-02-13 MED ORDER — SODIUM CHLORIDE 0.9 % IJ SOLN
10.0000 mL | Freq: Once | INTRAMUSCULAR | Status: AC
  Administered 2015-02-13: 10 mL via INTRAVENOUS

## 2015-02-13 MED ORDER — SODIUM CHLORIDE 0.9 % IV SOLN
INTRAVENOUS | Status: DC
  Administered 2015-02-13: 14:00:00 via INTRAVENOUS

## 2015-02-13 NOTE — Progress Notes (Signed)
Tolerated iron well 

## 2015-02-13 NOTE — Patient Instructions (Signed)
[place] at [facility] Discharge Instructions  RECOMMENDATIONS MADE BY THE CONSULTANT AND ANY TEST RESULTS WILL BE SENT TO YOUR REFERRING PHYSICIAN.  Ferric Gluconate 125 mg dose 2 of 3 infusion today. Return as scheduled for dose 3 of 3.  Thank you for choosing [place] at [facility] to provide your oncology and hematology care.  To afford each patient quality time with our provider, please arrive at least 15 minutes before your scheduled appointment time.    You need to re-schedule your appointment should you arrive 10 or more minutes late.  We strive to give you quality time with our providers, and arriving late affects you and other patients whose appointments are after yours.  Also, if you no show three or more times for appointments you may be dismissed from the clinic at the providers discretion.     Again, thank you for choosing [facility].  Our hope is that these requests will decrease the amount of time that you wait before being seen by our physicians.       _____________________________________________________________  Should you have questions after your visit to [facility], please contact our office at [phone] between the hours of 8:30 a.m. and 4:30 p.m.  Voicemails left after 4:30 p.m. will not be returned until the following business day.  For prescription refill requests, have your pharmacy contact our office.

## 2015-02-19 ENCOUNTER — Encounter (HOSPITAL_COMMUNITY): Payer: BLUE CROSS/BLUE SHIELD | Attending: Hematology & Oncology

## 2015-02-19 VITALS — BP 117/68 | HR 69 | Temp 97.8°F | Resp 20

## 2015-02-19 DIAGNOSIS — Z1239 Encounter for other screening for malignant neoplasm of breast: Secondary | ICD-10-CM | POA: Insufficient documentation

## 2015-02-19 DIAGNOSIS — K625 Hemorrhage of anus and rectum: Secondary | ICD-10-CM | POA: Insufficient documentation

## 2015-02-19 DIAGNOSIS — D509 Iron deficiency anemia, unspecified: Secondary | ICD-10-CM | POA: Diagnosis not present

## 2015-02-19 MED ORDER — SODIUM CHLORIDE 0.9 % IJ SOLN
10.0000 mL | Freq: Once | INTRAMUSCULAR | Status: AC
  Administered 2015-02-19: 10 mL via INTRAVENOUS

## 2015-02-19 MED ORDER — SODIUM CHLORIDE 0.9 % IV SOLN
INTRAVENOUS | Status: DC
  Administered 2015-02-19: 14:00:00 via INTRAVENOUS

## 2015-02-19 MED ORDER — SODIUM CHLORIDE 0.9 % IV SOLN
125.0000 mg | Freq: Once | INTRAVENOUS | Status: AC
  Administered 2015-02-19: 125 mg via INTRAVENOUS
  Filled 2015-02-19: qty 10

## 2015-02-19 NOTE — Progress Notes (Signed)
Tolerated infusion without problems. 

## 2015-02-19 NOTE — Patient Instructions (Signed)
[place] at [facility] Discharge Instructions  RECOMMENDATIONS MADE BY THE CONSULTANT AND ANY TEST RESULTS WILL BE SENT TO YOUR REFERRING PHYSICIAN.  Ferric gluconate 125 mg iron infusion as ordered. Return as scheduled.  Thank you for choosing [place] at [facility] to provide your oncology and hematology care.  To afford each patient quality time with our provider, please arrive at least 15 minutes before your scheduled appointment time.    You need to re-schedule your appointment should you arrive 10 or more minutes late.  We strive to give you quality time with our providers, and arriving late affects you and other patients whose appointments are after yours.  Also, if you no show three or more times for appointments you may be dismissed from the clinic at the providers discretion.     Again, thank you for choosing [facility].  Our hope is that these requests will decrease the amount of time that you wait before being seen by our physicians.       _____________________________________________________________  Should you have questions after your visit to [facility], please contact our office at [phone] between the hours of 8:30 a.m. and 4:30 p.m.  Voicemails left after 4:30 p.m. will not be returned until the following business day.  For prescription refill requests, have your pharmacy contact our office.

## 2015-05-20 ENCOUNTER — Other Ambulatory Visit (HOSPITAL_COMMUNITY): Payer: Self-pay | Admitting: Oncology

## 2015-05-20 DIAGNOSIS — D509 Iron deficiency anemia, unspecified: Secondary | ICD-10-CM

## 2015-05-20 MED ORDER — IRON-VITAMIN C 100-250 MG PO TABS: 1.0000 | ORAL_TABLET | Freq: Every day | ORAL | Status: DC

## 2015-07-30 ENCOUNTER — Ambulatory Visit (HOSPITAL_COMMUNITY): Payer: BLUE CROSS/BLUE SHIELD | Admitting: Hematology & Oncology

## 2015-07-30 ENCOUNTER — Other Ambulatory Visit (HOSPITAL_COMMUNITY): Payer: BLUE CROSS/BLUE SHIELD

## 2015-08-25 ENCOUNTER — Encounter (HOSPITAL_COMMUNITY): Payer: Self-pay | Admitting: Hematology & Oncology

## 2015-08-25 ENCOUNTER — Encounter (HOSPITAL_COMMUNITY): Payer: BLUE CROSS/BLUE SHIELD

## 2015-08-25 ENCOUNTER — Encounter (HOSPITAL_COMMUNITY): Payer: BLUE CROSS/BLUE SHIELD | Attending: Hematology & Oncology | Admitting: Hematology & Oncology

## 2015-08-25 VITALS — BP 127/77 | HR 71 | Temp 98.3°F | Resp 18 | Wt 210.0 lb

## 2015-08-25 DIAGNOSIS — K625 Hemorrhage of anus and rectum: Secondary | ICD-10-CM

## 2015-08-25 DIAGNOSIS — D649 Anemia, unspecified: Secondary | ICD-10-CM | POA: Diagnosis not present

## 2015-08-25 DIAGNOSIS — D509 Iron deficiency anemia, unspecified: Secondary | ICD-10-CM

## 2015-08-25 DIAGNOSIS — E611 Iron deficiency: Secondary | ICD-10-CM

## 2015-08-25 LAB — CBC WITH DIFFERENTIAL/PLATELET
Basophils Absolute: 0 10*3/uL (ref 0.0–0.1)
Basophils Relative: 0 %
Eosinophils Absolute: 0.2 10*3/uL (ref 0.0–0.7)
Eosinophils Relative: 2 %
HCT: 38.7 % (ref 36.0–46.0)
HEMOGLOBIN: 12.5 g/dL (ref 12.0–15.0)
LYMPHS ABS: 2.6 10*3/uL (ref 0.7–4.0)
LYMPHS PCT: 35 %
MCH: 27.1 pg (ref 26.0–34.0)
MCHC: 32.3 g/dL (ref 30.0–36.0)
MCV: 83.8 fL (ref 78.0–100.0)
Monocytes Absolute: 0.5 10*3/uL (ref 0.1–1.0)
Monocytes Relative: 7 %
Neutro Abs: 4.1 10*3/uL (ref 1.7–7.7)
Neutrophils Relative %: 56 %
Platelets: 345 10*3/uL (ref 150–400)
RBC: 4.62 MIL/uL (ref 3.87–5.11)
RDW: 15 % (ref 11.5–15.5)
WBC: 7.5 10*3/uL (ref 4.0–10.5)

## 2015-08-25 LAB — FERRITIN: Ferritin: 36 ng/mL (ref 11–307)

## 2015-08-25 NOTE — Patient Instructions (Addendum)
[place] at [facility] Discharge Instructions  RECOMMENDATIONS MADE BY THE CONSULTANT AND ANY TEST RESULTS WILL BE SENT TO YOUR REFERRING PHYSICIAN.    Exam completed by Dr Whitney Muse today Hemoglobin 12.5 ferritin level is still pending We will call you with those results Return to see the doctor in 6 months Blood work every 3 months Please call the clinic if you have any questions or concerns       Thank you for choosing [place] at [facility] to provide your oncology and hematology care.  To afford each patient quality time with our provider, please arrive at least 15 minutes before your scheduled appointment time.    You need to re-schedule your appointment should you arrive 10 or more minutes late.  We strive to give you quality time with our providers, and arriving late affects you and other patients whose appointments are after yours.  Also, if you no show three or more times for appointments you may be dismissed from the clinic at the providers discretion.     Again, thank you for choosing [facility].  Our hope is that these requests will decrease the amount of time that you wait before being seen by our physicians.       _____________________________________________________________  Should you have questions after your visit to [facility], please contact our office at [phone] between the hours of 8:30 a.m. and 4:30 p.m.  Voicemails left after 4:30 p.m. will not be returned until the following business day.  For prescription refill requests, have your pharmacy contact our office.

## 2015-08-25 NOTE — Progress Notes (Signed)
[place] at [facility] Progress Note  Patient Care Team: Elsie Lincoln, MD as PCP - General (Family Medicine) Daneil Dolin, MD (Gastroenterology) Marybelle Killings, MD as Consulting Physician (Orthopedic Surgery)  CHIEF COMPLAINTS/PURPOSE OF CONSULTATION:  Anemia Last EGD Feb. 2016 with Dr. Sydell Axon Iron deficiency, history of IV iron replacement Colonoscopy on 12/07/2011 with sigmoid diverticulosis, polyp, internal hemorrhoids   HISTORY OF PRESENTING ILLNESS:  Kathy Dawson 60 y.o. female is here because of iron deficiency anemia.  She is here alone today. She has a history of a R TKA and revision at [facility] two years ago. She recently had a R total hip in [place]. She notes she had a transfusion after her hip replacement. She is not sure of how many units of PRBC's she was given.   She is up to date on her colonoscopies. Mammogram was done in June of last year.  She denies a change in her bowel habits. She denies BRBPR or black or tarry stool.  She notes a problem with her R arm and is unable to pronate the arm.  She is frustrated because she seems to continue to have orthopedic problems.  She notes severe headaches for several weeks prior to her iron transfusions.  She states there is a definite link between low iron and her headaches.  MEDICAL HISTORY:  Past Medical History  Diagnosis Date  . GERD (gastroesophageal reflux disease)   . Knee pain   . Anemia   . DJD (degenerative joint disease), lumbosacral   . Complication of anesthesia 14 yrs. ago    woke up during the appendectomy    SURGICAL HISTORY: Past Surgical History  Procedure Laterality Date  . Tubal ligation    . Appendectomy    . Knee surgery    . Bilateral foot surgery    . Colonoscopy  12/08/2011    [id number] hemorrhoids/Sigmoid diverticulosis. Colonic polyp (tubular adenoma). Surveillance 2018  . 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 Esophagogastroduodenoscopy N/A 09/16/2014   Procedure: ESOPHAGOGASTRODUODENOSCOPY (EGD);  Surgeon: Daneil Dolin, MD;  [facility];  Service: Endoscopy;  Laterality: N/A;  1030am  . Maloney dilation N/A 09/16/2014    Procedure: Venia Minks DILATION;  Surgeon: Daneil Dolin, MD;  [facility];  Service: Endoscopy;  Laterality: N/A;  . Total hip arthroplasty Right 11/04/2014    Procedure: TOTAL HIP ARTHROPLASTY ANTERIOR APPROACH, Possible Local Bonegrafting Acetabular Cyst;  Surgeon: Marybelle Killings, MD;  Location: [place];  Service: Orthopedics;  Laterality: Right;  . Anterior hip revision Right 11/18/2014    Procedure: Right Total Hip Arthroplasty Femoral Revision-Anteior approach;  Surgeon: Marybelle Killings, MD;  Location: [place];  Service: Orthopedics;  Laterality: Right;    SOCIAL HISTORY: Social History   Social History  . Marital Status: Married    Spouse Name: N/A  . Number of Children: N/A  . Years of Education: N/A   Occupational History  . tobacco farmer    Social History Main Topics  . Smoking status: Former Smoker -- 0.50 packs/day for 10 years    Types: Cigarettes  . Smokeless tobacco: Former Systems developer    Quit date: 08/26/2006  . Alcohol Use: No  . Drug Use: No  . Sexual Activity: Not on file   Other Topics Concern  . Not on file   Social History Narrative    FAMILY HISTORY: Family History  Problem Relation Age of Onset  . Colon polyps Father   .  Cancer Father   . Colon cancer Neg Hx    indicated that her mother is alive. She indicated that her father is deceased.  Father is deceased due to stomach cancer.  ALLERGIES:  is allergic to clindamycin/lincomycin; prochlorperazine; prochlorperazine edisylate; and propoxyphene n-acetaminophen.  MEDICATIONS:  Current Outpatient Prescriptions  Medication Sig Dispense Refill  . calcium carbonate (TUMS - DOSED IN MG ELEMENTAL CALCIUM) 500 MG chewable tablet Chew 1 tablet by mouth daily as needed for heartburn. 750 mg    . diphenhydrAMINE (BENADRYL) 50 MG  capsule Take 50 mg by mouth every 6 (six) hours as needed.    . docusate sodium (COLACE) 100 MG capsule Take 1 capsule (100 mg total) by mouth 2 (two) times daily. 10 capsule 0  . Liniments (DEEP BLUE RELIEF) GEL Apply topically.    . Magnesium Citrate POWD 325 mg by Does not apply route.    . naproxen (NAPROSYN) 500 MG tablet   0  . acetaminophen (TYLENOL) 500 MG tablet Take 500 mg by mouth every 6 (six) hours as needed. Reported on 08/25/2015    . betamethasone dipropionate (DIPROLENE) 0.05 % cream Apply 1 application topically 2 (two) times daily. Reported on 08/25/2015    . Cholecalciferol (VITAMIN D3) 1000 UNITS CAPS Take 1 capsule by mouth daily. Reported on 08/25/2015    . Iron-Vitamin C (ICAR-C) 100-250 MG TABS Take 1 tablet by mouth daily. (Patient not taking: Reported on 08/25/2015) 30 each 3  . Lemon Oil OIL Take 3 drops by mouth 2 (two) times daily. Reported on 08/25/2015    . pantoprazole (PROTONIX) 40 MG tablet Take 1 tablet (40 mg total) by mouth daily. Take 30 minutes prior to breakfast (Patient not taking: Reported on 08/25/2015) 90 tablet 3   No current facility-administered medications for this visit.    Review of Systems  Constitutional: Positive for malaise/fatigue.       Fatigue due to low iron levels and restless sleep.  Cardiovascular: Positive for leg swelling.       Swelling in her ankles.  Psychiatric/Behavioral: The patient is not nervous/anxious.    14 point ROS was done and is otherwise as detailed above or in HPI   PHYSICAL EXAMINATION: ECOG PERFORMANCE STATUS: 0 - Asymptomatic  Filed Vitals:   08/25/15 0951  BP: 127/77  Pulse: 71  Temp: 98.3 F (36.8 C)  Resp: 18   Filed Weights   08/25/15 0951  Weight: 210 lb (95.255 kg)     Physical Exam  Constitutional: She is oriented to person, place, and time and well-developed, well-nourished, and in no distress.  HENT:  Head: Normocephalic and atraumatic.  Nose: Nose normal.  Mouth/Throat: Oropharynx is clear  and moist. No oropharyngeal exudate.  Eyes: Conjunctivae and EOM are normal. Pupils are equal, round, and reactive to light. Right eye exhibits no discharge. Left eye exhibits no discharge. No scleral icterus.  Neck: Normal range of motion. Neck supple. No tracheal deviation present. No thyromegaly present.  Cardiovascular: Normal rate, regular rhythm and normal heart sounds.  Exam reveals no gallop and no friction rub.   No murmur heard. Pulmonary/Chest: Effort normal and breath sounds normal. She has no wheezes. She has no rales.  Abdominal: Soft. Bowel sounds are normal. She exhibits no distension and no mass. There is no tenderness. There is no rebound and no guarding.  Musculoskeletal: Normal range of motion. She exhibits no edema.  Lymphadenopathy:    She has no cervical adenopathy.  Neurological: She is alert and  oriented to person, place, and time. She has normal reflexes. No cranial nerve deficit. Gait normal. Coordination normal.  Skin: Skin is warm and dry. No rash noted.  Psychiatric: Mood, memory, affect and judgment normal.  Nursing note and vitals reviewed.   LABORATORY DATA:  I have reviewed the data as listed Lab Results  Component Value Date   WBC 7.5 08/25/2015   HGB 12.5 08/25/2015   HCT 38.7 08/25/2015   MCV 83.8 08/25/2015   PLT 345 08/25/2015     ASSESSMENT & PLAN:  Anemia Last EGD Feb. 2016 with Dr. Sydell Axon Iron deficiency, history of IV iron replacement Colonoscopy on 12/07/2011 with sigmoid diverticulosis, polyp, internal hemorrhoids   We will schedule her for CBC and serum ferritin every 3 months.  She will continue with visits with [place] every six.  She will need another mammogram in June.   She will follow up in 6 months with repeats labs and office visit.   Orders Placed This Encounter  Procedures  . CBC with Differential    Standing Status: Standing     Number of Occurrences: 8     Standing Expiration Date: 07/24/2017  . Ferritin    Standing  Status: Standing     Number of Occurrences: 8     Standing Expiration Date: 07/24/2017    All questions were answered. The patient knows to call the clinic with any problems, questions or concerns. This note was electronically signed.    Molli Hazard, MD

## 2015-08-26 ENCOUNTER — Other Ambulatory Visit (HOSPITAL_COMMUNITY): Payer: Self-pay | Admitting: Hematology & Oncology

## 2015-08-26 ENCOUNTER — Telehealth (HOSPITAL_COMMUNITY): Payer: Self-pay

## 2015-08-26 DIAGNOSIS — D509 Iron deficiency anemia, unspecified: Secondary | ICD-10-CM

## 2015-08-26 NOTE — Telephone Encounter (Signed)
-----   Message from Patrici Ranks, MD sent at 08/26/2015  8:23 AM EST ----- 2 doses of ferrlicet. Orders are in. Dr.P

## 2015-08-26 NOTE — Telephone Encounter (Deleted)
-----   Message from Patrici Ranks, MD sent at 08/26/2015  8:23 AM EST ----- 2 doses of ferrlicet. Orders are in. Dr.P

## 2015-08-26 NOTE — Telephone Encounter (Deleted)
[facility] at [facility] Discharge Instructions  RECOMMENDATIONS MADE BY THE CONSULTANT AND ANY TEST RESULTS WILL BE SENT TO YOUR REFERRING PHYSICIAN.    Thank you for choosing [facility] at [facility] to provide your oncology and hematology care.  To afford each patient quality time with our provider, please arrive at least 15 minutes before your scheduled appointment time.    You need to re-schedule your appointment should you arrive 10 or more minutes late.  We strive to give you quality time with our providers, and arriving late affects you and other patients whose appointments are after yours.  Also, if you no show three or more times for appointments you may be dismissed from the clinic at the providers discretion.     Again, thank you for choosing [facility].  Our hope is that these requests will decrease the amount of time that you wait before being seen by our physicians.       _____________________________________________________________  Should you have questions after your visit to  Cancer Center, please contact our office at [phone] between the hours of 8:30 a.m. and 4:30 p.m.  Voicemails left after 4:30 p.m. will not be returned until the following business day.  For prescription refill requests, have your pharmacy contact our office.    

## 2015-09-02 ENCOUNTER — Encounter (HOSPITAL_BASED_OUTPATIENT_CLINIC_OR_DEPARTMENT_OTHER): Payer: BLUE CROSS/BLUE SHIELD

## 2015-09-02 VITALS — BP 121/70 | HR 64 | Temp 98.0°F | Resp 20

## 2015-09-02 DIAGNOSIS — D509 Iron deficiency anemia, unspecified: Secondary | ICD-10-CM

## 2015-09-02 MED ORDER — SODIUM CHLORIDE 0.9 % IJ SOLN
10.0000 mL | Freq: Once | INTRAMUSCULAR | Status: AC
Start: 2015-09-02 — End: 2015-09-02
  Administered 2015-09-02: 10 mL via INTRAVENOUS

## 2015-09-02 MED ORDER — SODIUM CHLORIDE 0.9 % IV SOLN
INTRAVENOUS | Status: DC
  Administered 2015-09-02: 14:00:00 via INTRAVENOUS

## 2015-09-02 MED ORDER — SODIUM CHLORIDE 0.9 % IV SOLN
125.0000 mg | Freq: Once | INTRAVENOUS | Status: AC
  Administered 2015-09-02: 125 mg via INTRAVENOUS
  Filled 2015-09-02: qty 10

## 2015-09-02 NOTE — Progress Notes (Signed)
Tolerated iron infusion well. 

## 2015-09-02 NOTE — Patient Instructions (Signed)
[facility] at [facility] Discharge Instructions  RECOMMENDATIONS MADE BY THE CONSULTANT AND ANY TEST RESULTS WILL BE SENT TO YOUR REFERRING PHYSICIAN.  Ferric gluconate 125 mg iron infusion given today as ordered. Return as scheduled.  Thank you for choosing [facility] at [facility] to provide your oncology and hematology care.  To afford each patient quality time with our provider, please arrive at least 15 minutes before your scheduled appointment time.    You need to re-schedule your appointment should you arrive 10 or more minutes late.  We strive to give you quality time with our providers, and arriving late affects you and other patients whose appointments are after yours.  Also, if you no show three or more times for appointments you may be dismissed from the clinic at the providers discretion.     Again, thank you for choosing [facility].  Our hope is that these requests will decrease the amount of time that you wait before being seen by our physicians.       _____________________________________________________________  Should you have questions after your visit to [facility], please contact our office at [phone] between the hours of 8:30 a.m. and 4:30 p.m.  Voicemails left after 4:30 p.m. will not be returned until the following business day.  For prescription refill requests, have your pharmacy contact our office.    

## 2015-09-05 ENCOUNTER — Encounter (HOSPITAL_BASED_OUTPATIENT_CLINIC_OR_DEPARTMENT_OTHER): Payer: BLUE CROSS/BLUE SHIELD

## 2015-09-05 VITALS — BP 110/69 | HR 63 | Temp 97.8°F | Resp 18

## 2015-09-05 DIAGNOSIS — D509 Iron deficiency anemia, unspecified: Secondary | ICD-10-CM | POA: Diagnosis not present

## 2015-09-05 MED ORDER — SODIUM CHLORIDE 0.9 % IJ SOLN: 10.0000 mL | Freq: Once | INTRAMUSCULAR | Status: DC

## 2015-09-05 MED ORDER — SODIUM CHLORIDE 0.9 % IV SOLN
INTRAVENOUS | Status: DC
  Administered 2015-09-05: 14:00:00 via INTRAVENOUS

## 2015-09-05 MED ORDER — SODIUM CHLORIDE 0.9 % IV SOLN
125.0000 mg | Freq: Once | INTRAVENOUS | Status: AC
  Administered 2015-09-05: 125 mg via INTRAVENOUS
  Filled 2015-09-05: qty 10

## 2015-09-05 NOTE — Patient Instructions (Signed)
[place] at [facility] Discharge Instructions  RECOMMENDATIONS MADE BY THE CONSULTANT AND ANY TEST RESULTS WILL BE SENT TO YOUR REFERRING PHYSICIAN.  IV iron today.  Return in April as scheduled.    Thank you for choosing [place] at [facility] to provide your oncology and hematology care.  To afford each patient quality time with our provider, please arrive at least 15 minutes before your scheduled appointment time.   Beginning January 23rd 2017 lab work for the [facility] will be done in the  Main lab at [facility] on 1st floor. If you have a lab appointment with the [facility] please come in thru the  Main Entrance and check in at the main information desk  You need to re-schedule your appointment should you arrive 10 or more minutes late.  We strive to give you quality time with our providers, and arriving late affects you and other patients whose appointments are after yours.  Also, if you no show three or more times for appointments you may be dismissed from the clinic at the providers discretion.     Again, thank you for choosing [facility].  Our hope is that these requests will decrease the amount of time that you wait before being seen by our physicians.       _____________________________________________________________  Should you have questions after your visit to [facility], please contact our office at [phone] between the hours of 8:30 a.m. and 4:30 p.m.  Voicemails left after 4:30 p.m. will not be returned until the following business day.  For prescription refill requests, have your pharmacy contact our office.

## 2015-09-05 NOTE — Progress Notes (Signed)
Patient tolerated infusion well.  VSS.   

## 2015-10-22 ENCOUNTER — Emergency Department (HOSPITAL_COMMUNITY)
Admission: EM | Admit: 2015-10-22 | Discharge: 2015-10-22 | Disposition: A | Discharge status: Home / Self Care | Payer: BLUE CROSS/BLUE SHIELD | Attending: Emergency Medicine | Admitting: Emergency Medicine

## 2015-10-22 ENCOUNTER — Emergency Department (HOSPITAL_COMMUNITY): Payer: BLUE CROSS/BLUE SHIELD

## 2015-10-22 ENCOUNTER — Encounter (HOSPITAL_COMMUNITY): Payer: Self-pay

## 2015-10-22 DIAGNOSIS — Z79899 Other long term (current) drug therapy: Secondary | ICD-10-CM | POA: Diagnosis not present

## 2015-10-22 DIAGNOSIS — Z9851 Tubal ligation status: Secondary | ICD-10-CM | POA: Insufficient documentation

## 2015-10-22 DIAGNOSIS — R079 Chest pain, unspecified: Secondary | ICD-10-CM | POA: Diagnosis not present

## 2015-10-22 DIAGNOSIS — Z9889 Other specified postprocedural states: Secondary | ICD-10-CM | POA: Diagnosis not present

## 2015-10-22 DIAGNOSIS — Z9049 Acquired absence of other specified parts of digestive tract: Secondary | ICD-10-CM | POA: Diagnosis not present

## 2015-10-22 DIAGNOSIS — R1013 Epigastric pain: Secondary | ICD-10-CM | POA: Diagnosis not present

## 2015-10-22 DIAGNOSIS — Z87891 Personal history of nicotine dependence: Secondary | ICD-10-CM | POA: Insufficient documentation

## 2015-10-22 LAB — BASIC METABOLIC PANEL
Anion gap: 9 (ref 5–15)
BUN: 16 mg/dL (ref 6–20)
CHLORIDE: 104 mmol/L (ref 101–111)
CO2: 27 mmol/L (ref 22–32)
Calcium: 9.3 mg/dL (ref 8.9–10.3)
Creatinine, Ser: 0.63 mg/dL (ref 0.44–1.00)
GFR calc Af Amer: >60 mL/min (ref >60)
GFR calc non Af Amer: >60 mL/min (ref >60)
GLUCOSE: 91 mg/dL (ref 65–99)
POTASSIUM: 3.9 mmol/L (ref 3.5–5.1)
Sodium: 140 mmol/L (ref 135–145)

## 2015-10-22 LAB — CBC WITH DIFFERENTIAL/PLATELET
Basophils Absolute: 0 10*3/uL (ref 0.0–0.1)
Basophils Relative: 0 %
EOS PCT: 2 %
Eosinophils Absolute: 0.2 10*3/uL (ref 0.0–0.7)
HCT: 39.8 % (ref 36.0–46.0)
HEMOGLOBIN: 13 g/dL (ref 12.0–15.0)
LYMPHS ABS: 3 10*3/uL (ref 0.7–4.0)
LYMPHS PCT: 45 %
MCH: 28.1 pg (ref 26.0–34.0)
MCHC: 32.7 g/dL (ref 30.0–36.0)
MCV: 86.1 fL (ref 78.0–100.0)
MONOS PCT: 9 %
Monocytes Absolute: 0.7 10*3/uL (ref 0.1–1.0)
Neutro Abs: 3 10*3/uL (ref 1.7–7.7)
Neutrophils Relative %: 44 %
PLATELETS: 312 10*3/uL (ref 150–400)
RBC: 4.62 MIL/uL (ref 3.87–5.11)
RDW: 15.2 % (ref 11.5–15.5)
WBC: 6.9 10*3/uL (ref 4.0–10.5)

## 2015-10-22 LAB — HEPATIC FUNCTION PANEL
ALBUMIN: 3.8 g/dL (ref 3.5–5.0)
ALK PHOS: 114 U/L (ref 38–126)
ALT: 17 U/L (ref 14–54)
AST: 14 U/L — ABNORMAL LOW (ref 15–41)
BILIRUBIN INDIRECT: 0.4 mg/dL (ref 0.3–0.9)
Bilirubin, Direct: 0.1 mg/dL (ref 0.1–0.5)
TOTAL PROTEIN: 7.3 g/dL (ref 6.5–8.1)
Total Bilirubin: 0.5 mg/dL (ref 0.3–1.2)

## 2015-10-22 LAB — I-STAT TROPONIN, ED: TROPONIN I, POC: 0 ng/mL (ref 0.00–0.08)

## 2015-10-22 LAB — LIPASE, BLOOD: LIPASE: 21 U/L (ref 11–51)

## 2015-10-22 LAB — TROPONIN I: Troponin I: <0.03 ng/mL (ref <0.031)

## 2015-10-22 MED ORDER — TRAMADOL HCL 50 MG PO TABS
50.0000 mg | ORAL_TABLET | Freq: Once | ORAL | Status: DC
  Filled 2015-10-22: qty 1

## 2015-10-22 NOTE — ED Notes (Signed)
I-Stat Troponin 0.00 at 21:42.

## 2015-10-22 NOTE — ED Notes (Signed)
Pt did not wish to take tramadol at this time, stated the pain was not "bad enough" to take medication.

## 2015-10-22 NOTE — ED Notes (Signed)
Pt reports woke up at 5 am with headache so she took 2 tylenol.  Pt says around 0645 she noticed her chest would hurt with movement.  As the day progressed, chest started hurting constantly and felt tight.  Pt had an appt with her obgyn today then went to urgent care.  Pt says at urgent care they gave her 1 nitro and her pain was better for approx 61min.  Pt presently c/o pressure to left side of chest and it radiates through to her back.  Denies n/v.  REports sob and fatigue.

## 2015-10-22 NOTE — ED Provider Notes (Addendum)
CSN: [id number]     Arrival date & time 10/22/15  1806 History   First MD Initiated Contact with Patient 10/22/15 1829     Chief Complaint  Patient presents with  . Chest Pain     (Consider location/radiation/quality/duration/timing/severity/associated sxs/prior Treatment) Patient is a 60 y.o. female presenting with chest pain. The history is provided by the patient (Patient complains of chest pain today and epigastric pain. No shortness of breath).  Chest Pain Pain location:  L chest Pain quality: aching   Pain radiates to:  Does not radiate Pain radiates to the back: no   Pain severity:  Moderate Onset quality:  Sudden Timing:  Intermittent Progression:  Waxing and waning Chronicity:  New Context: not breathing   Associated symptoms: no abdominal pain, no back pain, no cough, no fatigue and no headache     Past Medical History  Diagnosis Date  . GERD (gastroesophageal reflux disease)   . Knee pain   . Anemia   . DJD (degenerative joint disease), lumbosacral   . Complication of anesthesia 14 yrs. ago    woke up during the appendectomy   Past Surgical History  Procedure Laterality Date  . Tubal ligation    . Appendectomy    . Knee surgery    . Bilateral foot surgery    . Colonoscopy  12/08/2011    [id number] hemorrhoids/Sigmoid diverticulosis. Colonic polyp (tubular adenoma). Surveillance 2018  . 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 Esophagogastroduodenoscopy N/A 09/16/2014    Procedure: ESOPHAGOGASTRODUODENOSCOPY (EGD);  Surgeon: Daneil Dolin, MD;  [facility];  Service: Endoscopy;  Laterality: N/A;  1030am  . Maloney dilation N/A 09/16/2014    Procedure: Venia Minks DILATION;  Surgeon: Daneil Dolin, MD;  [facility];  Service: Endoscopy;  Laterality: N/A;  . Total hip arthroplasty Right 11/04/2014    Procedure: TOTAL HIP ARTHROPLASTY ANTERIOR APPROACH, Possible Local Bonegrafting Acetabular Cyst;  Surgeon: Marybelle Killings, MD;  Location: [address];   Service: Orthopedics;  Laterality: Right;  . Anterior hip revision Right 11/18/2014    Procedure: Right Total Hip Arthroplasty Femoral Revision-Anteior approach;  Surgeon: Marybelle Killings, MD;  Location: [place];  Service: Orthopedics;  Laterality: Right;   Family History  Problem Relation Age of Onset  . Colon polyps Father   . Cancer Father   . Colon cancer Neg Hx    Social History  Substance Use Topics  . Smoking status: Former Smoker -- 0.50 packs/day for 10 years    Types: Cigarettes  . Smokeless tobacco: Former Systems developer    Quit date: 08/26/2006  . Alcohol Use: No   OB History    No data available     Review of Systems  Constitutional: Negative for appetite change and fatigue.  HENT: Negative for congestion, ear discharge and sinus pressure.   Eyes: Negative for discharge.  Respiratory: Negative for cough.   Cardiovascular: Positive for chest pain.  Gastrointestinal: Negative for abdominal pain and diarrhea.  Genitourinary: Negative for frequency and hematuria.  Musculoskeletal: Negative for back pain.  Skin: Negative for rash.  Neurological: Negative for seizures and headaches.  Psychiatric/Behavioral: Negative for hallucinations.      Allergies  Clindamycin/lincomycin; Prochlorperazine; Prochlorperazine edisylate; and Propoxyphene n-acetaminophen  Home Medications   Prior to Admission medications   Medication Sig Start Date End Date Taking? Authorizing Provider  acetaminophen (TYLENOL) 500 MG tablet Take 500 mg by mouth every 6 (six) hours as needed for mild pain. Reported on 08/25/2015  Yes Historical Provider, MD  Cholecalciferol (VITAMIN D3) 1000 UNITS CAPS Take 1 capsule by mouth daily. Reported on 08/25/2015   Yes Historical Provider, MD  Lemon Oil OIL Take 3 drops by mouth 2 (two) times daily. Reported on 08/25/2015   Yes Historical Provider, MD  Magnesium Citrate POWD 325 mg by Does not apply route.   Yes Historical Provider, MD  calcium carbonate (TUMS - DOSED IN MG  ELEMENTAL CALCIUM) 500 MG chewable tablet Chew 1 tablet by mouth daily as needed for heartburn. 750 mg    Historical Provider, MD  diphenhydrAMINE (BENADRYL) 50 MG capsule Take 50 mg by mouth every 6 (six) hours as needed for allergies or sleep.     Historical Provider, MD  docusate sodium (COLACE) 100 MG capsule Take 1 capsule (100 mg total) by mouth 2 (two) times daily. Patient not taking: Reported on 10/22/2015 11/20/14   Marybelle Killings, MD  Iron-Vitamin C (ICAR-C) 100-250 MG TABS Take 1 tablet by mouth daily. Patient not taking: Reported on 08/25/2015 05/20/15   Manon Hilding Kefalas, PA-C  pantoprazole (PROTONIX) 40 MG tablet Take 1 tablet (40 mg total) by mouth daily. Take 30 minutes prior to breakfast Patient not taking: Reported on 08/25/2015 08/29/14   Orvil Feil, NP   BP 119/71 mmHg  Pulse 83  Temp(Src) 98.2 F (36.8 C) (Oral)  Resp 20  Ht 5\' 5"  (1.651 m)  Wt 209 lb (94.802 kg)  BMI 34.78 kg/m2  SpO2 100% Physical Exam  Constitutional: She is oriented to person, place, and time. She appears well-developed.  HENT:  Head: Normocephalic.  Eyes: Conjunctivae and EOM are normal. No scleral icterus.  Neck: Neck supple. No thyromegaly present.  Cardiovascular: Normal rate and regular rhythm.  Exam reveals no gallop and no friction rub.   No murmur heard. Pulmonary/Chest: No stridor. She has no wheezes. She has no rales. She exhibits no tenderness.  Abdominal: She exhibits no distension. There is no tenderness. There is no rebound.  Musculoskeletal: Normal range of motion. She exhibits no edema.  Lymphadenopathy:    She has no cervical adenopathy.  Neurological: She is oriented to person, place, and time. She exhibits normal muscle tone. Coordination normal.  Skin: No rash noted. No erythema.  Psychiatric: She has a normal mood and affect. Her behavior is normal.    ED Course  Procedures (including critical care time) Labs Review Labs Reviewed  HEPATIC FUNCTION PANEL - Abnormal; Notable for  the following:    AST 14 (*)    All other components within normal limits  CBC WITH DIFFERENTIAL/PLATELET  BASIC METABOLIC PANEL  TROPONIN I  LIPASE, BLOOD  I-STAT TROPOININ, ED    Imaging Review Dg Chest Portable 1 View  10/22/2015  CLINICAL DATA:  Chest pain radiating to posterior back on left side EXAM: PORTABLE CHEST 1 VIEW COMPARISON:  10/24/2014 FINDINGS: The heart size and mediastinal contours are within normal limits. Both lungs are clear. The visualized skeletal structures are unremarkable. IMPRESSION: No active disease. Electronically Signed   By: Skipper Cliche M.D.   On: 10/22/2015 19:01   I have personally reviewed and evaluated these images and lab results as part of my medical decision-making.   EKG Interpretation None      MDM   Final diagnoses:  Chest pain at rest    Troponin 2 normal EKG unremarkable chest x-ray unremarkable. Doubt cardiac disease. Patient has a strong family history she will be referred to cardiology for further evaluation.  Milton Ferguson, MD 10/22/15 [id number]  Milton Ferguson, MD 10/22/15 [phone]

## 2015-10-22 NOTE — Discharge Instructions (Signed)
Follow up with your family md and [place] cardiology  By next week. Take your prilosec once a day

## 2015-10-31 ENCOUNTER — Encounter: Payer: Self-pay | Admitting: *Deleted

## 2015-10-31 ENCOUNTER — Encounter: Payer: Self-pay | Admitting: Internal Medicine

## 2015-10-31 ENCOUNTER — Other Ambulatory Visit: Payer: Self-pay | Admitting: Internal Medicine

## 2015-10-31 ENCOUNTER — Ambulatory Visit (INDEPENDENT_AMBULATORY_CARE_PROVIDER_SITE_OTHER): Payer: BLUE CROSS/BLUE SHIELD | Admitting: Internal Medicine

## 2015-10-31 VITALS — BP 112/72 | HR 78 | Ht 65.0 in | Wt 212.0 lb

## 2015-10-31 DIAGNOSIS — R079 Chest pain, unspecified: Secondary | ICD-10-CM | POA: Diagnosis not present

## 2015-10-31 DIAGNOSIS — E785 Hyperlipidemia, unspecified: Secondary | ICD-10-CM | POA: Diagnosis not present

## 2015-10-31 LAB — LIPID PANEL
CHOL/HDL RATIO: 3.8 ratio (ref <=5.0)
CHOLESTEROL: 165 mg/dL (ref 125–200)
HDL: 44 mg/dL — ABNORMAL LOW (ref >=46)
LDL Cholesterol: 104 mg/dL (ref <130)
Triglycerides: 84 mg/dL (ref <150)
VLDL: 17 mg/dL (ref <30)

## 2015-10-31 NOTE — Patient Instructions (Addendum)
Your physician recommends that you schedule a follow-up appointment: We will call you with result of test and follow-up.   Your physician recommends that you have lab work done today.  Your physician recommends that you continue on your current medications as directed. Please refer to the Current Medication list given to you today.  Your physician has requested that you have en exercise stress myoview. For further information please visit [email or web address]. Please follow instruction sheet, as given.  If you need a refill on your cardiac medications before your next appointment, please call your pharmacy.  Thank you for choosing [facility]!
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
## 2015-11-12 ENCOUNTER — Encounter (HOSPITAL_COMMUNITY)
Admission: RE | Admit: 2015-11-12 | Discharge: 2015-11-12 | Disposition: A | Discharge status: Home / Self Care | Payer: BLUE CROSS/BLUE SHIELD | Source: Ambulatory Visit | Attending: Internal Medicine | Admitting: Internal Medicine

## 2015-11-12 ENCOUNTER — Inpatient Hospital Stay (HOSPITAL_COMMUNITY): Admission: RE | Admit: 2015-11-12 | Payer: BLUE CROSS/BLUE SHIELD | Source: Ambulatory Visit

## 2015-11-12 ENCOUNTER — Encounter (HOSPITAL_COMMUNITY): Payer: Self-pay

## 2015-11-12 DIAGNOSIS — R079 Chest pain, unspecified: Secondary | ICD-10-CM | POA: Insufficient documentation

## 2015-11-12 LAB — NM MYOCAR MULTI W/SPECT W/WALL MOTION / EF
LV dias vol: 84 mL (ref 46–106)
LV sys vol: 28 mL
Peak HR: 107 {beats}/min
RATE: 0.28
Rest HR: 77 {beats}/min
SDS: 0
SRS: 0
SSS: 0
TID: 1.2

## 2015-11-12 MED ORDER — REGADENOSON 0.4 MG/5ML IV SOLN
INTRAVENOUS | Status: AC
  Administered 2015-11-12: 0.4 mg via INTRAVENOUS
  Filled 2015-11-12: qty 5

## 2015-11-12 MED ORDER — SODIUM CHLORIDE 0.9% FLUSH
INTRAVENOUS | Status: AC
  Administered 2015-11-12: 10 mL via INTRAVENOUS
  Filled 2015-11-12: qty 10

## 2015-11-12 MED ORDER — TECHNETIUM TC 99M SESTAMIBI - CARDIOLITE
10.0000 | Freq: Once | INTRAVENOUS | Status: AC | PRN
  Administered 2015-11-12: 10.8 via INTRAVENOUS

## 2015-11-12 MED ORDER — TECHNETIUM TC 99M SESTAMIBI GENERIC - CARDIOLITE
30.0000 | Freq: Once | INTRAVENOUS | Status: AC | PRN
  Administered 2015-11-12: 30.1 via INTRAVENOUS

## 2015-11-13 ENCOUNTER — Encounter: Payer: Self-pay | Admitting: *Deleted

## 2015-11-21 ENCOUNTER — Other Ambulatory Visit: Payer: Self-pay | Admitting: Orthopedic Surgery

## 2015-11-21 DIAGNOSIS — M25551 Pain in right hip: Secondary | ICD-10-CM

## 2015-11-24 ENCOUNTER — Encounter (HOSPITAL_COMMUNITY): Payer: BLUE CROSS/BLUE SHIELD | Attending: Hematology & Oncology

## 2015-11-24 DIAGNOSIS — D509 Iron deficiency anemia, unspecified: Secondary | ICD-10-CM | POA: Diagnosis present

## 2015-11-24 LAB — CBC WITH DIFFERENTIAL/PLATELET
BASOS ABS: 0 10*3/uL (ref 0.0–0.1)
BASOS PCT: 0 %
EOS ABS: 0.1 10*3/uL (ref 0.0–0.7)
EOS PCT: 1 %
HCT: 42.4 % (ref 36.0–46.0)
Hemoglobin: 13.9 g/dL (ref 12.0–15.0)
LYMPHS ABS: 3.5 10*3/uL (ref 0.7–4.0)
Lymphocytes Relative: 37 %
MCH: 28.3 pg (ref 26.0–34.0)
MCHC: 32.8 g/dL (ref 30.0–36.0)
MCV: 86.2 fL (ref 78.0–100.0)
Monocytes Absolute: 0.6 10*3/uL (ref 0.1–1.0)
Monocytes Relative: 7 %
Neutro Abs: 5.2 10*3/uL (ref 1.7–7.7)
Neutrophils Relative %: 55 %
PLATELETS: 358 10*3/uL (ref 150–400)
RBC: 4.92 MIL/uL (ref 3.87–5.11)
RDW: 14.3 % (ref 11.5–15.5)
WBC: 9.5 10*3/uL (ref 4.0–10.5)

## 2015-11-24 LAB — FERRITIN: Ferritin: 54 ng/mL (ref 11–307)

## 2015-11-26 ENCOUNTER — Ambulatory Visit
Admission: RE | Admit: 2015-11-26 | Discharge: 2015-11-26 | Disposition: A | Discharge status: Home / Self Care | Payer: BLUE CROSS/BLUE SHIELD | Source: Ambulatory Visit | Attending: Orthopedic Surgery | Admitting: Orthopedic Surgery

## 2015-11-26 ENCOUNTER — Other Ambulatory Visit: Payer: Self-pay | Admitting: Orthopedic Surgery

## 2015-11-26 DIAGNOSIS — M25551 Pain in right hip: Secondary | ICD-10-CM

## 2016-01-14 ENCOUNTER — Other Ambulatory Visit: Payer: Self-pay | Admitting: Obstetrics & Gynecology

## 2016-01-26 NOTE — Patient Instructions (Addendum)
Your procedure is scheduled on:  Monday, February 02, 2016  Enter through the Main Entrance of [facility] at:  11:30 AM  Pick up the phone at the desk and dial [phone].  Call this number if you have problems the morning of surgery: [phone].  Remember:  Do NOT eat food:  After Midnight Sunday  Do NOT drink clear liquids after:  9:00 AM day of surgery  Take these medicines the morning of surgery with a SIP OF WATER:  None  Stop taking fish oil at this time  Do NOT wear jewelry (body piercing), metal hair clips/bobby pins, make-up, or nail polish. Do NOT wear lotions, powders, or perfumes.  You may wear deodorant. Do NOT shave for 48 hours prior to surgery. Do NOT bring valuables to the hospital.   Leave suitcase in car.  After surgery it may be brought to your room.  For patients admitted to the hospital, checkout time is 11:00 AM the day of discharge. Home with husband Darrell cell [phone] or daughter Danae Chen [phone]

## 2016-01-27 ENCOUNTER — Encounter (HOSPITAL_COMMUNITY)
Admission: RE | Admit: 2016-01-27 | Discharge: 2016-01-27 | Disposition: A | Discharge status: Home / Self Care | Payer: BLUE CROSS/BLUE SHIELD | Source: Ambulatory Visit | Attending: Obstetrics & Gynecology | Admitting: Obstetrics & Gynecology

## 2016-01-27 ENCOUNTER — Encounter (HOSPITAL_COMMUNITY): Payer: Self-pay

## 2016-01-27 DIAGNOSIS — Z01812 Encounter for preprocedural laboratory examination: Secondary | ICD-10-CM | POA: Insufficient documentation

## 2016-01-27 HISTORY — DX: Unspecified osteoarthritis, unspecified site: M19.90

## 2016-01-27 LAB — CBC
HEMATOCRIT: 38.5 % (ref 36.0–46.0)
HEMOGLOBIN: 12.8 g/dL (ref 12.0–15.0)
MCH: 28.2 pg (ref 26.0–34.0)
MCHC: 33.2 g/dL (ref 30.0–36.0)
MCV: 84.8 fL (ref 78.0–100.0)
Platelets: 308 10*3/uL (ref 150–400)
RBC: 4.54 MIL/uL (ref 3.87–5.11)
RDW: 14.4 % (ref 11.5–15.5)
WBC: 8 10*3/uL (ref 4.0–10.5)

## 2016-02-02 ENCOUNTER — Ambulatory Visit (HOSPITAL_COMMUNITY): Payer: BLUE CROSS/BLUE SHIELD | Admitting: Anesthesiology

## 2016-02-02 ENCOUNTER — Encounter (HOSPITAL_COMMUNITY)
Admission: AD | Disposition: A | Discharge status: Home / Self Care | Payer: Self-pay | Source: Ambulatory Visit | Attending: Obstetrics & Gynecology

## 2016-02-02 ENCOUNTER — Inpatient Hospital Stay (HOSPITAL_COMMUNITY)
Admission: AD | Admit: 2016-02-02 | Discharge: 2016-02-03 | DRG: 743 | Disposition: A | Discharge status: Home / Self Care | Payer: BLUE CROSS/BLUE SHIELD | Source: Ambulatory Visit | Attending: Obstetrics & Gynecology | Admitting: Obstetrics & Gynecology

## 2016-02-02 ENCOUNTER — Encounter (HOSPITAL_COMMUNITY): Payer: Self-pay | Admitting: Anesthesiology

## 2016-02-02 DIAGNOSIS — Z79899 Other long term (current) drug therapy: Secondary | ICD-10-CM

## 2016-02-02 DIAGNOSIS — Z9049 Acquired absence of other specified parts of digestive tract: Secondary | ICD-10-CM

## 2016-02-02 DIAGNOSIS — Z888 Allergy status to other drugs, medicaments and biological substances status: Secondary | ICD-10-CM | POA: Diagnosis not present

## 2016-02-02 DIAGNOSIS — Z87891 Personal history of nicotine dependence: Secondary | ICD-10-CM

## 2016-02-02 DIAGNOSIS — Z9889 Other specified postprocedural states: Secondary | ICD-10-CM

## 2016-02-02 DIAGNOSIS — Z86010 Personal history of colonic polyps: Secondary | ICD-10-CM

## 2016-02-02 DIAGNOSIS — K219 Gastro-esophageal reflux disease without esophagitis: Secondary | ICD-10-CM | POA: Diagnosis present

## 2016-02-02 DIAGNOSIS — M1990 Unspecified osteoarthritis, unspecified site: Secondary | ICD-10-CM | POA: Diagnosis present

## 2016-02-02 DIAGNOSIS — N393 Stress incontinence (female) (male): Secondary | ICD-10-CM | POA: Diagnosis present

## 2016-02-02 DIAGNOSIS — Z9851 Tubal ligation status: Secondary | ICD-10-CM | POA: Diagnosis not present

## 2016-02-02 DIAGNOSIS — Z8371 Family history of colonic polyps: Secondary | ICD-10-CM | POA: Diagnosis not present

## 2016-02-02 DIAGNOSIS — N814 Uterovaginal prolapse, unspecified: Secondary | ICD-10-CM | POA: Diagnosis present

## 2016-02-02 DIAGNOSIS — Z96651 Presence of right artificial knee joint: Secondary | ICD-10-CM | POA: Diagnosis present

## 2016-02-02 DIAGNOSIS — Z809 Family history of malignant neoplasm, unspecified: Secondary | ICD-10-CM

## 2016-02-02 HISTORY — PX: BLADDER SUSPENSION: SHX72

## 2016-02-02 HISTORY — PX: CYSTOSCOPY: SHX5120

## 2016-02-02 HISTORY — PX: VAGINAL HYSTERECTOMY: SHX2639

## 2016-02-02 SURGERY — TRANSVAGINAL TAPE (TVT) PROCEDURE
Anesthesia: General | Site: Vagina

## 2016-02-02 MED ORDER — SODIUM CHLORIDE 0.9% FLUSH: 9.0000 mL | INTRAVENOUS | Status: DC | PRN

## 2016-02-02 MED ORDER — ROCURONIUM BROMIDE 100 MG/10ML IV SOLN
INTRAVENOUS | Status: AC
  Filled 2016-02-02: qty 1

## 2016-02-02 MED ORDER — VASOPRESSIN 20 UNIT/ML IV SOLN
INTRAVENOUS | Status: DC | PRN
  Administered 2016-02-02: 20 mL via INTRAMUSCULAR
  Administered 2016-02-02: 10 mL via INTRAMUSCULAR
  Administered 2016-02-02: 20 mL via INTRAMUSCULAR

## 2016-02-02 MED ORDER — PROPOFOL 10 MG/ML IV BOLUS
INTRAVENOUS | Status: DC | PRN
  Administered 2016-02-02: 160 mg via INTRAVENOUS

## 2016-02-02 MED ORDER — GLYCOPYRROLATE 0.2 MG/ML IJ SOLN
INTRAMUSCULAR | Status: DC | PRN
  Administered 2016-02-02: .8 mg via INTRAVENOUS
  Administered 2016-02-02: 0.1 mg via INTRAVENOUS

## 2016-02-02 MED ORDER — ONDANSETRON HCL 4 MG/2ML IJ SOLN
INTRAMUSCULAR | Status: DC | PRN
Start: 2016-02-02 — End: 2016-02-02
  Administered 2016-02-02: 4 mg via INTRAVENOUS

## 2016-02-02 MED ORDER — DEXAMETHASONE SODIUM PHOSPHATE 10 MG/ML IJ SOLN
INTRAMUSCULAR | Status: AC
  Filled 2016-02-02: qty 1

## 2016-02-02 MED ORDER — LACTATED RINGERS IV SOLN
INTRAVENOUS | Status: DC
  Administered 2016-02-02 (×2): via INTRAVENOUS
  Administered 2016-02-02: 1000 mL via INTRAVENOUS
  Administered 2016-02-02 (×2): via INTRAVENOUS

## 2016-02-02 MED ORDER — LIDOCAINE HCL (CARDIAC) 20 MG/ML IV SOLN
INTRAVENOUS | Status: DC | PRN
  Administered 2016-02-02: 100 mg via INTRAVENOUS

## 2016-02-02 MED ORDER — MIDAZOLAM HCL 2 MG/2ML IJ SOLN
INTRAMUSCULAR | Status: AC
  Filled 2016-02-02: qty 2

## 2016-02-02 MED ORDER — ONDANSETRON HCL 4 MG/2ML IJ SOLN
INTRAMUSCULAR | Status: AC
  Filled 2016-02-02: qty 2

## 2016-02-02 MED ORDER — ESTRADIOL 0.1 MG/GM VA CREA
TOPICAL_CREAM | VAGINAL | Status: DC | PRN
  Administered 2016-02-02: 1 via VAGINAL

## 2016-02-02 MED ORDER — NEOSTIGMINE METHYLSULFATE 10 MG/10ML IV SOLN
INTRAVENOUS | Status: AC
  Filled 2016-02-02: qty 1

## 2016-02-02 MED ORDER — FENTANYL CITRATE (PF) 250 MCG/5ML IJ SOLN
INTRAMUSCULAR | Status: AC
  Filled 2016-02-02: qty 5

## 2016-02-02 MED ORDER — HYDROMORPHONE HCL 1 MG/ML IJ SOLN
INTRAMUSCULAR | Status: DC | PRN
Start: 2016-02-02 — End: 2016-02-02
  Administered 2016-02-02: 0.5 mg via INTRAVENOUS
  Administered 2016-02-02: 1 mg via INTRAVENOUS

## 2016-02-02 MED ORDER — PHENYLEPHRINE 40 MCG/ML (10ML) SYRINGE FOR IV PUSH (FOR BLOOD PRESSURE SUPPORT)
PREFILLED_SYRINGE | INTRAVENOUS | Status: AC
  Filled 2016-02-02: qty 10

## 2016-02-02 MED ORDER — ROCURONIUM BROMIDE 100 MG/10ML IV SOLN
INTRAVENOUS | Status: DC | PRN
  Administered 2016-02-02 (×3): 10 mg via INTRAVENOUS
  Administered 2016-02-02: 40 mg via INTRAVENOUS
  Administered 2016-02-02: 10 mg via INTRAVENOUS

## 2016-02-02 MED ORDER — ONDANSETRON HCL 4 MG/2ML IJ SOLN: 4.0000 mg | Freq: Four times a day (QID) | INTRAMUSCULAR | Status: DC | PRN

## 2016-02-02 MED ORDER — HYDROMORPHONE HCL 1 MG/ML IJ SOLN
0.5000 mg | INTRAMUSCULAR | Status: DC | PRN
  Administered 2016-02-02 (×2): 0.5 mg via INTRAVENOUS

## 2016-02-02 MED ORDER — DIPHENHYDRAMINE HCL 12.5 MG/5ML PO ELIX: 12.5000 mg | ORAL_SOLUTION | Freq: Four times a day (QID) | ORAL | Status: DC | PRN

## 2016-02-02 MED ORDER — LACTATED RINGERS IV SOLN
INTRAVENOUS | Status: DC
  Administered 2016-02-02: 20:00:00 via INTRAVENOUS

## 2016-02-02 MED ORDER — FENTANYL CITRATE (PF) 100 MCG/2ML IJ SOLN
25.0000 ug | INTRAMUSCULAR | Status: DC | PRN
  Administered 2016-02-02: 25 ug via INTRAVENOUS
  Administered 2016-02-02: 50 ug via INTRAVENOUS
  Administered 2016-02-02: 25 ug via INTRAVENOUS

## 2016-02-02 MED ORDER — VASOPRESSIN 20 UNIT/ML IV SOLN
INTRAVENOUS | Status: AC
  Filled 2016-02-02: qty 1

## 2016-02-02 MED ORDER — FENTANYL CITRATE (PF) 100 MCG/2ML IJ SOLN
INTRAMUSCULAR | Status: AC
  Administered 2016-02-02: 25 ug via INTRAVENOUS
  Filled 2016-02-02: qty 2

## 2016-02-02 MED ORDER — OXYCODONE-ACETAMINOPHEN 5-325 MG PO TABS
1.0000 | ORAL_TABLET | ORAL | Status: DC | PRN
  Administered 2016-02-03 (×3): 2 via ORAL
  Filled 2016-02-02 (×3): qty 2

## 2016-02-02 MED ORDER — SCOPOLAMINE 1 MG/3DAYS TD PT72
MEDICATED_PATCH | TRANSDERMAL | Status: AC
  Filled 2016-02-02: qty 1

## 2016-02-02 MED ORDER — SODIUM CHLORIDE 0.9 % IJ SOLN
INTRAMUSCULAR | Status: AC
  Filled 2016-02-02: qty 10

## 2016-02-02 MED ORDER — HYDROMORPHONE 1 MG/ML IV SOLN
INTRAVENOUS | Status: DC
  Administered 2016-02-02: 20:00:00 via INTRAVENOUS
  Administered 2016-02-03: 4.5 mg via INTRAVENOUS
  Administered 2016-02-03: 2.4 mg via INTRAVENOUS
  Filled 2016-02-02: qty 25

## 2016-02-02 MED ORDER — SODIUM CHLORIDE 0.9 % IJ SOLN
INTRAMUSCULAR | Status: AC
  Filled 2016-02-02: qty 50

## 2016-02-02 MED ORDER — PHENYLEPHRINE HCL 10 MG/ML IJ SOLN
INTRAMUSCULAR | Status: DC | PRN
  Administered 2016-02-02: 40 ug via INTRAVENOUS

## 2016-02-02 MED ORDER — FENTANYL CITRATE (PF) 100 MCG/2ML IJ SOLN
INTRAMUSCULAR | Status: AC
  Filled 2016-02-02: qty 2

## 2016-02-02 MED ORDER — NEOSTIGMINE METHYLSULFATE 10 MG/10ML IV SOLN
INTRAVENOUS | Status: DC | PRN
  Administered 2016-02-02: 4 mg via INTRAVENOUS

## 2016-02-02 MED ORDER — PROPOFOL 10 MG/ML IV BOLUS
INTRAVENOUS | Status: AC
  Filled 2016-02-02: qty 20

## 2016-02-02 MED ORDER — KETOROLAC TROMETHAMINE 30 MG/ML IJ SOLN
30.0000 mg | Freq: Once | INTRAMUSCULAR | Status: AC
  Administered 2016-02-02: 30 mg via INTRAVENOUS

## 2016-02-02 MED ORDER — DIPHENHYDRAMINE HCL 50 MG/ML IJ SOLN: 12.5000 mg | Freq: Four times a day (QID) | INTRAMUSCULAR | Status: DC | PRN

## 2016-02-02 MED ORDER — DEXAMETHASONE SODIUM PHOSPHATE 4 MG/ML IJ SOLN
INTRAMUSCULAR | Status: DC | PRN
  Administered 2016-02-02: 10 mg via INTRAVENOUS

## 2016-02-02 MED ORDER — MENTHOL 3 MG MT LOZG: 1.0000 | LOZENGE | OROMUCOSAL | Status: DC | PRN

## 2016-02-02 MED ORDER — NALOXONE HCL 0.4 MG/ML IJ SOLN: 0.4000 mg | INTRAMUSCULAR | Status: DC | PRN

## 2016-02-02 MED ORDER — HYDROMORPHONE HCL 1 MG/ML IJ SOLN
INTRAMUSCULAR | Status: AC
  Filled 2016-02-02: qty 1

## 2016-02-02 MED ORDER — LIDOCAINE HCL (CARDIAC) 20 MG/ML IV SOLN
INTRAVENOUS | Status: AC
  Filled 2016-02-02: qty 5

## 2016-02-02 MED ORDER — CEFAZOLIN SODIUM-DEXTROSE 2-4 GM/100ML-% IV SOLN
2.0000 g | INTRAVENOUS | Status: AC
  Administered 2016-02-02: 2 g via INTRAVENOUS

## 2016-02-02 MED ORDER — GLYCOPYRROLATE 0.2 MG/ML IJ SOLN
INTRAMUSCULAR | Status: AC
  Filled 2016-02-02: qty 4

## 2016-02-02 MED ORDER — ESTRADIOL 0.1 MG/GM VA CREA
TOPICAL_CREAM | VAGINAL | Status: AC
  Filled 2016-02-02: qty 42.5

## 2016-02-02 MED ORDER — HYDROMORPHONE HCL 1 MG/ML IJ SOLN
INTRAMUSCULAR | Status: AC
  Administered 2016-02-02: 0.5 mg via INTRAVENOUS
  Filled 2016-02-02: qty 1

## 2016-02-02 MED ORDER — STERILE WATER FOR IRRIGATION IR SOLN
Status: DC | PRN
  Administered 2016-02-02: 1000 mL

## 2016-02-02 MED ORDER — ONDANSETRON HCL 4 MG PO TABS: 4.0000 mg | ORAL_TABLET | Freq: Four times a day (QID) | ORAL | Status: DC | PRN

## 2016-02-02 MED ORDER — MIDAZOLAM HCL 2 MG/2ML IJ SOLN
INTRAMUSCULAR | Status: DC | PRN
  Administered 2016-02-02: 2 mg via INTRAVENOUS

## 2016-02-02 MED ORDER — FENTANYL CITRATE (PF) 250 MCG/5ML IJ SOLN
INTRAMUSCULAR | Status: DC | PRN
  Administered 2016-02-02 (×3): 50 ug via INTRAVENOUS
  Administered 2016-02-02: 100 ug via INTRAVENOUS
  Administered 2016-02-02 (×2): 50 ug via INTRAVENOUS

## 2016-02-02 MED ORDER — KETOROLAC TROMETHAMINE 30 MG/ML IJ SOLN
INTRAMUSCULAR | Status: AC
  Administered 2016-02-02: 30 mg via INTRAVENOUS
  Filled 2016-02-02: qty 1

## 2016-02-02 MED ORDER — SCOPOLAMINE 1 MG/3DAYS TD PT72
1.0000 | MEDICATED_PATCH | Freq: Once | TRANSDERMAL | Status: DC
  Administered 2016-02-02: 1.5 mg via TRANSDERMAL

## 2016-02-02 SURGICAL SUPPLY — 36 items
BAG URINE DRAINAGE (UROLOGICAL SUPPLIES) ×5 IMPLANT
BLADE SURG 15 STRL LF C SS BP (BLADE) ×6 IMPLANT
BLADE SURG 15 STRL SS (BLADE) ×4
CANISTER SUCT 3000ML (MISCELLANEOUS) ×5 IMPLANT
CATH FOLEY 2WAY SLVR  5CC 16FR (CATHETERS) ×2
CATH FOLEY 2WAY SLVR  5CC 18FR (CATHETERS) ×2
CATH FOLEY 2WAY SLVR 5CC 16FR (CATHETERS) ×3 IMPLANT
CATH FOLEY 2WAY SLVR 5CC 18FR (CATHETERS) ×3 IMPLANT
CLOTH BEACON ORANGE TIMEOUT ST (SAFETY) ×5 IMPLANT
CONT PATH 16OZ SNAP LID 3702 (MISCELLANEOUS) IMPLANT
DECANTER SPIKE VIAL GLASS SM (MISCELLANEOUS) ×10 IMPLANT
GAUZE PACKING 1 X5 YD ST (GAUZE/BANDAGES/DRESSINGS) ×5 IMPLANT
GAUZE PACKING 2X5 YD STRL (GAUZE/BANDAGES/DRESSINGS) ×5 IMPLANT
GAUZE SPONGE 4X4 16PLY XRAY LF (GAUZE/BANDAGES/DRESSINGS) ×5 IMPLANT
GLOVE BIO SURGEON STRL SZ7 (GLOVE) ×5 IMPLANT
GLOVE BIOGEL PI IND STRL 7.0 (GLOVE) ×6 IMPLANT
GLOVE BIOGEL PI INDICATOR 7.0 (GLOVE) ×4
GOWN STRL REUS W/TWL LRG LVL3 (GOWN DISPOSABLE) ×20 IMPLANT
LIQUID BAND (GAUZE/BANDAGES/DRESSINGS) ×5 IMPLANT
NEEDLE HYPO 22GX1.5 SAFETY (NEEDLE) ×5 IMPLANT
NS IRRIG 1000ML POUR BTL (IV SOLUTION) ×5 IMPLANT
PACK VAGINAL WOMENS (CUSTOM PROCEDURE TRAY) ×5 IMPLANT
RETRACTOR STAY HOOK 5MM (MISCELLANEOUS) IMPLANT
SET CYSTO W/LG BORE CLAMP LF (SET/KITS/TRAYS/PACK) ×5 IMPLANT
SLING TVT EXACT (Sling) ×5 IMPLANT
SUT VIC AB 0 CT1 18XCR BRD8 (SUTURE) ×6 IMPLANT
SUT VIC AB 0 CT1 27 (SUTURE) ×2
SUT VIC AB 0 CT1 27XBRD ANBCTR (SUTURE) ×3 IMPLANT
SUT VIC AB 0 CT1 8-18 (SUTURE) ×4
SUT VIC AB 2-0 SH 27 (SUTURE) ×4
SUT VIC AB 2-0 SH 27XBRD (SUTURE) ×6 IMPLANT
SUT VIC AB 3-0 SH 27 (SUTURE) ×2
SUT VIC AB 3-0 SH 27X BRD (SUTURE) ×3 IMPLANT
TOWEL OR 17X24 6PK STRL BLUE (TOWEL DISPOSABLE) ×10 IMPLANT
TRAY FOLEY CATH SILVER 14FR (SET/KITS/TRAYS/PACK) ×5 IMPLANT
WATER STERILE IRR 1000ML POUR (IV SOLUTION) ×5 IMPLANT

## 2016-02-02 NOTE — Anesthesia Procedure Notes (Addendum)
Procedure Name: Intubation Date/Time: 02/02/2016 1:21 PM Performed by: Flossie Dibble Pre-anesthesia Checklist: Patient being monitored, Suction available, Patient identified and Timeout performed Patient Re-evaluated:Patient Re-evaluated prior to inductionOxygen Delivery Method: Circle system utilized Preoxygenation: Pre-oxygenation with 100% oxygen Intubation Type: IV induction Ventilation: Oral airway inserted - appropriate to patient size and Mask ventilation without difficulty Laryngoscope Size: 3 (LO-PRO-3 Glidescope) Grade View: Grade I Tube size: 7.0 mm Number of attempts: 1 Airway Equipment and Method: Patient positioned with wedge pillow,  Video-laryngoscopy and Stylet Placement Confirmation: ETT inserted through vocal cords under direct vision,  breath sounds checked- equal and bilateral and positive ETCO2 Secured at: 21 cm Tube secured with: Tape Dental Injury: Teeth and Oropharynx as per pre-operative assessment  Difficulty Due To: Difficult Airway- due to anterior larynx and Difficulty was anticipated

## 2016-02-02 NOTE — Op Note (Signed)
PATIENT:  Kathy Dawson (1956-04-02)  PRE-OPERATIVE DIAGNOSIS: Cystocele grade 3, stress urinary incontinence   POST-OPERATIVE DIAGNOSIS:  Uterovaginal prolapse grade 2-3 and no cystocele.   PROCEDURE:  Total vaginal hysterectomy, TVT sling (Exact), Cystoscopy  SURGEON:  Elveria Royals, MD - Primary  ASSISTANT: Aloha Gell, MD  ANESTHESIA:  General endotracheal   EBL:  250 cc  IVFluids: 2800cc  Urine Out: 150 cc clear  DRAINS: Urinary Catheter  LOCAL MEDICATIONS USED: 50 cc dilute vasopressin  SPECIMEN: Uterus and cervix  DISPOSITION OF SPECIMEN:  PATHOLOGY  COUNTS:  YES  PLAN OF CARE: Admit for overnight observation  PATIENT DISPOSITION:  PACU - hemodynamically stable.   Delay start of Pharmacological VTE agent (>24hrs) due to surgical blood loss or risk of bleeding  Procedure:  Indication:  60 year old female with office exam consistent with grade 3 cystocele and grade 1 uterine prolapse. She also had Urodynamics, noted to have genuine stress urinary incontinence. She was counseled to have Cystocele repair and possible vaginal hysterectomy if prolapse was more significant under anesthesia. She was noted to have stage 1 cystocele and stage 2-3 uterovaginal prolapse under anesthesia and hence decision was made to proceed with vaginal hysterectomy.   She was brought to the operating room with IV running and received 2 gm Ancef. Time Out carried out and patient was confirmed with procedure as mentioned above.  She underwent General endotracheal anesthesia without complications. Foley was placed, clear urine was draining well. Exam noted more significant stage 2-3 uterine prolapse and minimal to stage 1 cystocele and rectocele. So surgery was changed to complete vaginal hysterectomy and possible A/P repair and TVT sling and Cystoscopy.   Two Deaver retractors placed on the anterior and posterior vaginal wall and cervix was grasped with 2 tenaculums. Dilute Pitressin 20:50 cc  of saline was injected circumferentially and incision made with Bovie on the cervical vaginal junction. Vaginal walls were dissected away from the body of the cervix. Attempts made to perform posterior colpotomy but failed due to wrong plane. Then focus was turned for anterior colpotomy. Anterior vaginal wall and lateral wall dissection done until cervicovaginal fascia was seen. Anterior peritoneum was seen and grasped, incised and Deaver was introduced. Bilateral Uterosacral ligaments clamped with Heaney clamps, cut and suture transfixed. Not again posterior attention given and after dissection, posterior colpotomy performed. Narrow long weighted speculum placed.  Bilateral Uterosacral ligaments were better exposed and re-grasped with Heaney's and cut and transfixed with 0 Vicryl.  Bilateral parametrial/ broad ligament tissue was clamped, cut, sutured followed by bilateral uterine vessels that were clamped, cut and suture ligated. Round ligaments were clamped and cut and suture transfixed. Moist tailed sponge placed vaginally to push bowels away from uterine fundus. Then the rest of the adnexal ligaments were clamped and cut and suture transfixed. Uterus and cervix were removed. Left vaginal angle noted bleeding, so figure of eight stitch placed above the uterosacral pedicle x2 and hemostasis was noted. Posterior vaginal cut was bleeding, so running interlocking stitches placed all along the posterior cuff and hemostasis was excellent. Pedicles appeared hemostatic. Both uterosacral ligaments with sutured with vaginal angles.  There was no significant Cystocele at this point and there was no rectocele. Colporrhaphy could potentially cause vaginal shortening, so anterior repair was deferred. Vaginal cut edges were approximated from superior to inferior aspect in interlocking fashion with 0 Vicryl.   Now TVT sling was started. Position changed to lower legs in lithotomy with minimal trendelenberg.  Exit points at  suprapubic area marked 3 fingers apart on either side of midline. Mid-urethral area in the vagina was identified and 2 Allis clamps placed on either side. Dilute Vasopressin injected in vaginal walls to create passage for TVT needles and also suprapubicaly at exit sites and along retropubic tracts for the needles. 1 cm vertical incision made between Allis clamps and with Metz scissors lateral vaginal tracts created moving in the direction of ipsilateral shoulders. TVT Exact tape was obtained and Mosquito clamp placed on its mid-portion and Tape was mounted on TVT needles. Foley was removed and new #18 Foley catheter was taken, bladder guide passed in it and foley was now placed in the bladder and balloon inflated. Guide was moved to patient's left and TVT needle was guided towards retropubic area through vaginal tunnels in the direction of exit site hugging pubic bone. As the needle tip was pushed out of the exit site via premade incision, the tip was grasped with Kelly clamp and needle was removed. Now bladder guide was moved to the left and tape mounted TVT needle was inserted through the right vaginal tunnel, toward ipsilateral shoulder and then hugging the pubic bone, proceeding anteriorly right behind the bone through the exit site. Tip grasped and needle removed. Bladder guide and foley removed from the bladder. Cystoscopy performed with saline. Bladder appeared normal with wider internal urethral opening. Bilateral ureter jets noted and normal, no TVT plastic noted in the bladder on Cystoscopy.  Bladder drained after removing Cystoscope and #16 Foley was placed again. Once bladder was drained, the TVT tape was pulled though while keeping a Mayo needle vaginally between vaginal dissected area and the tape to release it tension-free. Excess mesh removed from suprapubic exit sites. Skin openings closed with Dermabond. Vaginal incision was closed with 0Vicryl. Vaginal packing done with Estrogen moistened vaginal  packing after hemostasis was excellent.    All counts were correct x 2. Patient stable and tolerated the procedure well. She was brought to the operating room in stable condition.  I performed the surgery. Surgery change was discussed with patient and her husband.   Manon Hilding, MD

## 2016-02-02 NOTE — Anesthesia Postprocedure Evaluation (Signed)
Anesthesia Post Note  Patient: Kathy Dawson  Procedure(s) Performed: Procedure(s) (LRB): TRANSVAGINAL TAPE (TVT) Mid-Urethral Sling PROCEDURE (N/A) CYSTOSCOPY (N/A) HYSTERECTOMY VAGINAL (N/A)  Patient location during evaluation: PACU Anesthesia Type: General Level of consciousness: awake and alert and oriented Pain management: pain level controlled Vital Signs Assessment: post-procedure vital signs reviewed and stable Respiratory status: spontaneous breathing, nonlabored ventilation, respiratory function stable and patient connected to nasal cannula oxygen Cardiovascular status: blood pressure returned to baseline and stable Postop Assessment: no signs of nausea or vomiting Anesthetic complications: no     Last Vitals:  Filed Vitals:   02/02/16 1815 02/02/16 1830  BP: 124/76 138/82  Pulse: 82 70  Temp:    Resp: 20 11    Last Pain:  Filed Vitals:   02/02/16 1836  PainSc: 8    Pain Goal: Patients Stated Pain Goal: 5 (02/02/16 1135)               Hiilani Jetter A.

## 2016-02-02 NOTE — Anesthesia Preprocedure Evaluation (Signed)
Anesthesia Evaluation  Patient identified by MRN, date of birth, ID band Patient awake    Reviewed: Allergy & Precautions, NPO status , Patient's Chart, lab work & pertinent test results  History of Anesthesia Complications (+) history of anesthetic complications  Airway Mallampati: II  TM Distance: >3 FB Neck ROM: Full    Dental no notable dental hx.    Pulmonary former smoker,    Pulmonary exam normal breath sounds clear to auscultation       Cardiovascular negative cardio ROS Normal cardiovascular exam Rhythm:Regular Rate:Normal     Neuro/Psych negative neurological ROS  negative psych ROS   GI/Hepatic Neg liver ROS, GERD  ,  Endo/Other  negative endocrine ROS  Renal/GU negative Renal ROS  negative genitourinary   Musculoskeletal  (+) Arthritis ,   Abdominal (+) + obese,   Peds negative pediatric ROS (+)  Hematology  (+) anemia ,   Anesthesia Other Findings   Reproductive/Obstetrics negative OB ROS                             Anesthesia Physical Anesthesia Plan  ASA: II  Anesthesia Plan: General   Post-op Pain Management:    Induction: Intravenous  Airway Management Planned: Oral ETT  Additional Equipment:   Intra-op Plan:   Post-operative Plan: Extubation in OR  Informed Consent: I have reviewed the patients History and Physical, chart, labs and discussed the procedure including the risks, benefits and alternatives for the proposed anesthesia with the patient or authorized representative who has indicated his/her understanding and acceptance.   Dental advisory given  Plan Discussed with: CRNA  Anesthesia Plan Comments:         Anesthesia Quick Evaluation

## 2016-02-02 NOTE — H&P (Signed)
Kathy Dawson is an 60 y.o. female. Menopausal female with no HRT. C/o perineal bulge and was noted to have grade 3 cystocele without uterine prolapse, has stress urinary incontinence and wants surgical intervention, declined Pessary. Active female. Wants to maintain sexual function.  Knee and hip replaced (right). Overall healthy.  Nl Paps and no breast complaints/   No LMP recorded. Patient is postmenopausal.    Past Medical History  Diagnosis Date  . Knee pain   . Complication of anesthesia 14 yrs. ago    woke up during the appendectomy  . GERD (gastroesophageal reflux disease)     otc med prn  . Arthritis     knees, hips, elbow  . Anemia     Hx    Past Surgical History  Procedure Laterality Date  . Tubal ligation    . Appendectomy    . Knee surgery Right 2011    arthroscopy  . Bilateral foot surgery      hammer toes  . Colonoscopy  12/08/2011    [id number] hemorrhoids/Sigmoid diverticulosis. Colonic polyp (tubular adenoma). Surveillance 2018  . 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 Esophagogastroduodenoscopy N/A 09/16/2014    Procedure: ESOPHAGOGASTRODUODENOSCOPY (EGD);  Surgeon: Kathy Dolin, MD;  [facility];  Service: Endoscopy;  Laterality: N/A;  1030am  . Maloney dilation N/A 09/16/2014    Procedure: Kathy Dawson DILATION;  Surgeon: Kathy Dolin, MD;  [facility];  Service: Endoscopy;  Laterality: N/A;  . Total hip arthroplasty Right 11/04/2014    Procedure: TOTAL HIP ARTHROPLASTY ANTERIOR APPROACH, Possible Local Bonegrafting Acetabular Cyst;  Surgeon: Kathy Killings, MD;  Location: [place];  Service: Orthopedics;  Laterality: Right;  . Anterior hip revision Right 11/18/2014    Procedure: Right Total Hip Arthroplasty Femoral Revision-Anteior approach;  Surgeon: Kathy Killings, MD;  Location: [place];  Service: Orthopedics;  Laterality: Right;    Family History  Problem Relation Age of Onset  . Colon polyps Father   . Cancer Father   . Colon cancer  Neg Hx     Social History:  reports that she has quit smoking. Her smoking use included Cigarettes. She has a 5 pack-year smoking history. She has never used smokeless tobacco. She reports that she does not drink alcohol or use illicit drugs.  Allergies:  Allergies  Allergen Reactions  . Clindamycin/Lincomycin Itching    Itching and Hives  . Prochlorperazine Other (See Comments)    Seizure like symptoms Other reaction(s): Other (See Comments) Seizure like symptoms    Prescriptions prior to admission  Medication Sig Dispense Refill Last Dose  . acetaminophen (TYLENOL) 500 MG tablet Take 500 mg by mouth every 6 (six) hours as needed for mild pain. Reported on 08/25/2015   02/02/2016 at 0830 time  . Cholecalciferol (VITAMIN D3) 1000 UNITS CAPS Take 1 capsule by mouth daily. Reported on 08/25/2015   Past Week at Unknown time  . Digestive Enzymes (PAPAYA ENZYME PO) Take 1 tablet by mouth daily.   Past Week at Unknown time  . ibuprofen (ADVIL,MOTRIN) 200 MG tablet Take 200 mg by mouth every 6 (six) hours as needed for moderate pain.     Kathy Dawson Oil OIL Take 3 drops by mouth 2 (two) times daily. Reported on 08/25/2015   02/01/2016 at Unknown time  . Omega-3 Fatty Acids (FISH OIL) 1000 MG CAPS Take 1,000 mg by mouth.   Past Month at Unknown time    ROS no UTI s/s  Blood pressure 115/76, pulse 77, temperature 98.4 F (36.9 C), temperature source Oral, SpO2 98 %. Physical Exam  A&O x 3, no acute distress. Pleasant HEENT neg, no thyromegaly Lungs CTA bilat CV RRR, S2 normal Abdo soft, non tender, non acute Extr no edema/ tenderness Pelvic  Grade 3 cystocele, hypermobility of urethra  Office Urodynamics reviewed genuine SUI  Assessment/Plan: Grade 3 cystocele and genuine SUI. Here for anterior colporrhaphy, TVT, cystoscopy and possible perineorrhaphy.  Risks/complications of surgery reviewed incl infection, bleeding, damage to internal organs including bladder, bowels, ureters, blood  vessels, other risks from anesthesia, VTE and delayed complications of any surgery, complications in future surgery reviewed. Also discussed post-op urinary retention, need for catheter, long term mesh related problems, infection/ erosion and progress of uterine prolapse needing further prolapse surgery.  Pt understand and agrees.   [name] R 02/02/2016, 12:34 PM

## 2016-02-02 NOTE — Addendum Note (Signed)
Addendum  created 02/02/16 1854 by Josephine Igo, MD   Modules edited: Orders

## 2016-02-02 NOTE — OR Nursing (Signed)
Lunette Stands, CST out to waiting room to tell husband that Dr. Benjie Karvonen proceeded with the vaginal hysterectomy and surgery will take longer than expected for that reason. Per Dr. Benjie Karvonen

## 2016-02-02 NOTE — Transfer of Care (Signed)
Immediate Anesthesia Transfer of Care Note  Patient: Kathy Dawson  Procedure(s) Performed: Procedure(s): TRANSVAGINAL TAPE (TVT) Mid-Urethral Sling PROCEDURE (N/A) CYSTOSCOPY (N/A) HYSTERECTOMY VAGINAL (N/A)  Patient Location: PACU  Anesthesia Type:General  Level of Consciousness: awake  Airway & Oxygen Therapy: Patient Spontanous Breathing and Patient connected to nasal cannula oxygen  Post-op Assessment: Report given to RN and Post -op Vital signs reviewed and stable  Post vital signs: Reviewed and stable  Last Vitals:  Filed Vitals:   02/02/16 1135  BP: 115/76  Pulse: 77  Temp: 36.9 C    Last Pain:  Filed Vitals:   02/02/16 1141  PainSc: 1       Patients Stated Pain Goal: 5 ([id number])  Complications: No apparent anesthesia complications

## 2016-02-03 ENCOUNTER — Encounter (HOSPITAL_COMMUNITY): Payer: Self-pay | Admitting: Obstetrics & Gynecology

## 2016-02-03 LAB — CBC
HCT: 34.6 % — ABNORMAL LOW (ref 36.0–46.0)
Hemoglobin: 11.2 g/dL — ABNORMAL LOW (ref 12.0–15.0)
MCH: 28 pg (ref 26.0–34.0)
MCHC: 32.4 g/dL (ref 30.0–36.0)
MCV: 86.5 fL (ref 78.0–100.0)
Platelets: 275 10*3/uL (ref 150–400)
RBC: 4 MIL/uL (ref 3.87–5.11)
RDW: 14.4 % (ref 11.5–15.5)
WBC: 11.3 10*3/uL — ABNORMAL HIGH (ref 4.0–10.5)

## 2016-02-03 LAB — BASIC METABOLIC PANEL
Anion gap: 7 (ref 5–15)
BUN: 11 mg/dL (ref 6–20)
CALCIUM: 8.5 mg/dL — AB (ref 8.9–10.3)
CO2: 25 mmol/L (ref 22–32)
CREATININE: 0.58 mg/dL (ref 0.44–1.00)
Chloride: 103 mmol/L (ref 101–111)
GFR calc non Af Amer: >60 mL/min (ref >60)
Glucose, Bld: 124 mg/dL — ABNORMAL HIGH (ref 65–99)
Potassium: 4.3 mmol/L (ref 3.5–5.1)
SODIUM: 135 mmol/L (ref 135–145)

## 2016-02-03 MED ORDER — SENNOSIDES-DOCUSATE SODIUM 8.6-50 MG PO TABS: 1.0000 | ORAL_TABLET | Freq: Every day | ORAL | Status: DC

## 2016-02-03 MED ORDER — IBUPROFEN 200 MG PO TABS: 600.0000 mg | ORAL_TABLET | Freq: Four times a day (QID) | ORAL | Status: DC | PRN

## 2016-02-03 MED ORDER — OXYCODONE-ACETAMINOPHEN 5-325 MG PO TABS: 1.0000 | ORAL_TABLET | ORAL | Status: DC | PRN

## 2016-02-03 NOTE — Anesthesia Postprocedure Evaluation (Signed)
Anesthesia Post Note  Patient: Kathy Dawson  Procedure(s) Performed: Procedure(s) (LRB): TRANSVAGINAL TAPE (TVT) Mid-Urethral Sling PROCEDURE (N/A) CYSTOSCOPY (N/A) HYSTERECTOMY VAGINAL (N/A)  Patient location during evaluation: [facility] Anesthesia Type: General Level of consciousness: awake, awake and alert, oriented and patient cooperative Pain management: pain level controlled Vital Signs Assessment: post-procedure vital signs reviewed and stable Respiratory status: spontaneous breathing, nonlabored ventilation and respiratory function stable Cardiovascular status: stable Postop Assessment: no headache, no backache, patient able to bend at knees and no signs of nausea or vomiting Anesthetic complications: no     Last Vitals:  Filed Vitals:   02/03/16 0223 02/03/16 0600  BP:  122/65  Pulse:  74  Temp:  36.8 C  Resp: 14 14    Last Pain:  Filed Vitals:   02/03/16 0645  PainSc: 3    Pain Goal: Patients Stated Pain Goal: 3 (02/03/16 0422)               [name] L

## 2016-02-03 NOTE — Progress Notes (Signed)
Patient ID: Kathy Dawson, female   DOB: [birth year], 58 y.o.   MRN: [id number] Bladder scan noted 300 cc PVR but voided volume not tested Plan to check next void and PVR and plan foley for home for 72 hrs if PVR>50% of voided volume. RN to call me.

## 2016-02-03 NOTE — Discharge Instructions (Signed)
Hysterectomy Information A hysterectomy is a surgery to remove your uterus. After surgery, you will no longer have periods. Also, you will not be able to get pregnant.  REASONS FOR THIS SURGERY  You have bleeding that is not normal and keeps coming back.  You have lasting (chronic) lower belly (pelvic) pain.  You have a lasting infection.  The lining of your uterus grows outside your uterus.  The lining of your uterus grows in the muscle of your uterus.  Your uterus falls down into your vagina.  You have a growth in your uterus that causes problems.  You have cells that could turn into cancer (precancerous cells).  You have cancer of the uterus or cervix. TYPES  There are 3 types of hysterectomies. Depending on the type, the surgery will:  Remove the top part of the uterus only.  Remove the uterus and the cervix.  Remove the uterus, cervix, and tissue that holds the uterus in place in the lower belly. WAYS A HYSTERECTOMY CAN BE PERFORMED There are 5 ways this surgery can be performed.   A cut (incision) is made in the belly (abdomen). The uterus is taken out through the cut.  A cut is made in the vagina. The uterus is taken out through the cut.  Three or four cuts are made in the belly. A surgical device with a camera is put through one of the cuts. The uterus is cut into small pieces. The uterus is taken out through the cuts or the vagina.  Three or four cuts are made in the belly. A surgical device with a camera is put through one of the cuts. The uterus is taken out through the vagina.  Three or four cuts are made in the belly. A surgical device that is controlled by a computer makes a visual image. The device helps the surgeon control the surgical tools. The uterus is cut into small pieces. The pieces are taken out through the cuts or through the vagina. WHAT TO EXPECT AFTER THE SURGERY  You will be given pain medicine.  You will need help at home for 3-5 days after  surgery.  You will need to see your doctor in 2-4 weeks after surgery.  You may get hot flashes, have night sweats, and have trouble sleeping.  You may need to have Pap tests in the future if your surgery was related to cancer. Talk to your doctor. It is still good to have regular exams.   This information is not intended to replace advice given to you by your health care provider. Make sure you discuss any questions you have with your health care provider.   Document Released: 10/25/2011 Document Revised: 05/23/2013 Document Reviewed: 04/09/2013 Elsevier Interactive Patient Education 2016 Elsevier Inc.  Urethral Vaginal Sling, Care After Refer to this sheet in the next few weeks. These instructions provide you with information on caring for yourself after your procedure. Your health care provider may also give you more specific instructions. Your treatment has been planned according to current medical practices, but problems sometimes occur. Call your health care provider if you have any problems or questions after your procedure.  WHAT TO EXPECT AFTER THE PROCEDURE  After your procedure, it is typical to have the following:  A catheter in your bladder until your bladder is able to work on its own properly. You will be instructed on how to empty the catheter bag.  Absorbable stitches in your incisions. They will slowly dissolve over 1-2  months. HOME CARE INSTRUCTIONS  Get plenty of rest.  Only take over-the-counter or prescription medicines as directed by your health care provider. Do not take aspirin because it can cause bleeding.  Do not take baths. Take showers until your health care provider tells you otherwise.  You may resume your usual diet. Eat a well-balanced diet.  Drink enough fluids to keep your urine clear or pale yellow.  Limit exercise and activities as directed by your health care provider. Do not lift anything heavier than 5 pounds (2.3 kg).  Do not douche, use  tampons, or have sexual intercourse for 6 weeks after your procedure.  Follow up with your health care provider as directed. SEEK MEDICAL CARE IF:  You have a heavy or bad smelling vaginal discharge.   You have a rash.   You have pain that is not controlled with medicines.   You have lightheadedness or feel faint.  SEEK IMMEDIATE MEDICAL CARE IF:  You have a fever.   You have vaginal bleeding.   You faint.   You have shortness of breath.   You have chest, abdominal, or leg pain.   You have pain when urinating or cannot urinate.   Your catheter is still in your bladder and becomes blocked.   You have swelling, redness, and pain in the vaginal area.    This information is not intended to replace advice given to you by your health care provider. Make sure you discuss any questions you have with your health care provider.   Document Released: 05/23/2013 Document Reviewed: 05/23/2013 Elsevier Interactive Patient Education [employer].

## 2016-02-03 NOTE — Discharge Summary (Signed)
Physician Discharge Summary  Patient ID: Kathy Dawson MRN: [id number] DOB/AGE: Jan 11, 1956 60 y.o.  Admit date: 02/02/2016 Discharge date: 02/03/2016  Admission Diagnoses: Uterovaginal prolapse, Stress urinary Incontinence  Discharge Diagnoses: Same. Total Vaginal Hysterectomy, TVT Sling, Cystoscopy                                                       Post-op urinary retention, high post void residual volumes  Discharged Condition: Stable. Higher post-void residual volumes and need to get indwelling catheter for 72 hours and office visit for bladder voiding trial   Hospital Course: Uncomplicated. Failed voiding trials  Discharge Exam: Blood pressure 106/64, pulse 64, temperature 98.4 F (36.9 C), temperature source Oral, resp. rate 16, SpO2 100 %. General appearance: alert, cooperative and appears stated age Resp: clear to auscultation bilaterally Cardio: regular rate and rhythm, S1, S2 normal, no murmur, click, rub or gallop Pelvic: no active bleeding Extremities: extremities normal, atraumatic, no cyanosis or edema and Homans sign is negative, no sign of DVT  Disposition: 01-Home or Self Care  Discharge Instructions    Call MD for:  difficulty breathing, headache or visual disturbances    Complete by:  As directed      Call MD for:  extreme fatigue    Complete by:  As directed      Call MD for:  hives    Complete by:  As directed      Call MD for:  persistant dizziness or light-headedness    Complete by:  As directed      Call MD for:  persistant nausea and vomiting    Complete by:  As directed      Call MD for:  redness, tenderness, or signs of infection (pain, swelling, redness, odor or green/yellow discharge around incision site)    Complete by:  As directed      Call MD for:  severe uncontrolled pain    Complete by:  As directed      Call MD for:  temperature >100.4    Complete by:  As directed      Call MD for:    Complete by:  As directed   Heavy vaginal bleeding.  Not being able to urinate     Diet - low sodium heart healthy    Complete by:  As directed      Increase activity slowly    Complete by:  As directed      Lifting restrictions    Complete by:  As directed   20 lbs only for 3 months     Sexual Activity Restrictions    Complete by:  As directed   None for 8 weeks            Medication List    TAKE these medications        acetaminophen 500 MG tablet  Commonly known as:  TYLENOL  Take 500 mg by mouth every 6 (six) hours as needed for mild pain. Reported on 08/25/2015     Fish Oil 1000 MG Caps  Take 1,000 mg by mouth.     ibuprofen 200 MG tablet  Commonly known as:  ADVIL,MOTRIN  Take 200 mg by mouth every 6 (six) hours as needed for moderate pain.     ibuprofen 200 MG tablet  Commonly known as:  MOTRIN IB  Take 3 tablets (600 mg total) by mouth every 6 (six) hours as needed.     Lemon Oil Oil  Take 3 drops by mouth 2 (two) times daily. Reported on 08/25/2015     oxyCODONE-acetaminophen 5-325 MG tablet  Commonly known as:  ROXICET  Take 1 tablet by mouth every 4 (four) hours as needed for severe pain.     PAPAYA ENZYME PO  Take 1 tablet by mouth daily.     senna-docusate 8.6-50 MG tablet  Commonly known as:  Senokot-S  Take 1 tablet by mouth at bedtime.     Vitamin D3 1000 units Caps  Take 1 capsule by mouth daily. Reported on 08/25/2015           Follow-up Information    Follow up with Jaya Lapka R, MD. Schedule an appointment as soon as possible for a visit in 2 weeks.   Specialty:  Obstetrics and Gynecology   Contact information:   [address] [place] [address] [phone]     Immediate f/up in 72 hrs for bladder voiding trial  Signed: Micky Sheller R 02/03/2016, 2:59 PM

## 2016-02-03 NOTE — Progress Notes (Signed)
1 Day Post-Op Procedure(s) (LRB): TRANSVAGINAL TAPE (TVT) Mid-Urethral Sling PROCEDURE (N/A) CYSTOSCOPY (N/A) HYSTERECTOMY VAGINAL (N/A)  Subjective: Patient reports no nausea, vomiting and incisional pain.  Cramps at 1-2/10, overall ok. Foley out this AM, but vag pack still in, will remove now.  No CP/SOB/HAs  Objective: I have reviewed patient's vital signs, intake and output, medications and labs. BP 122/65 mmHg  Pulse 74  Temp(Src) 98.2 F (36.8 C) (Oral)  Resp 14  SpO2 97% I/O last 3 completed shifts: In: 3980 [P.O.:480; I.V.:3500] Out: 1525 [Urine:1325; Blood:200]    General: alert and cooperative Resp: clear to auscultation bilaterally Cardio: regular rate and rhythm, S1, S2 normal, no murmur, click, rub or gallop GI: soft, non-tender; bowel sounds normal; no masses,  no organomegaly Extremities: extremities normal, atraumatic, no cyanosis or edema Vaginal Bleeding: none Vaginal packing removed, no bleeding noted   CBC Latest Ref Rng 02/03/2016 01/27/2016 11/24/2015  WBC 4.0 - 10.5 K/uL 11.3(H) 8.0 9.5  Hemoglobin 12.0 - 15.0 g/dL 11.2(L) 12.8 13.9  Hematocrit 36.0 - 46.0 % 34.6(L) 38.5 42.4  Platelets 150 - 400 K/uL 275 308 358    CMP Latest Ref Rng 02/03/2016 10/22/2015 11/19/2014  Glucose 65 - 99 mg/dL 124(H) 91 106(H)  BUN 6 - 20 mg/dL 11 16 12   Creatinine 0.44 - 1.00 mg/dL 0.58 0.63 0.66  Sodium 135 - 145 mmol/L 135 140 135  Potassium 3.5 - 5.1 mmol/L 4.3 3.9 4.2  Chloride 101 - 111 mmol/L 103 104 100  CO2 22 - 32 mmol/L 25 27 30   Calcium 8.9 - 10.3 mg/dL 8.5(L) 9.3 8.7  Total Protein 6.5 - 8.1 g/dL - 7.3 -  Total Bilirubin 0.3 - 1.2 mg/dL - 0.5 -  Alkaline Phos 38 - 126 U/L - 114 -  AST 15 - 41 U/L - 14(L) -  ALT 14 - 54 U/L - 17 -     Assessment: s/p Procedure(s): TRANSVAGINAL TAPE (TVT) Mid-Urethral Sling PROCEDURE (N/A) CYSTOSCOPY (N/A) HYSTERECTOMY VAGINAL (N/A): stable, progressing well and tolerating diet  Plan: Advance diet Encourage  ambulation Advance to PO medication Discontinue IV fluids Discharge home if able to void, plan to check PVR with 2 voids and if wnl, will D/c home this afternoon.   Surgery reviewed.   LOS: 1 day    Kathy Dawson R 02/03/2016, 8:27 AM

## 2016-02-03 NOTE — Addendum Note (Signed)
Addendum  created 02/03/16 0758 by Raenette Rover, CRNA   Modules edited: Clinical Notes   Clinical Notes:  File: [id number]

## 2016-02-23 ENCOUNTER — Other Ambulatory Visit (HOSPITAL_COMMUNITY): Payer: BLUE CROSS/BLUE SHIELD

## 2016-02-23 ENCOUNTER — Ambulatory Visit (HOSPITAL_COMMUNITY): Payer: BLUE CROSS/BLUE SHIELD | Admitting: Oncology

## 2016-03-01 ENCOUNTER — Encounter (HOSPITAL_COMMUNITY): Payer: BLUE CROSS/BLUE SHIELD

## 2016-03-01 ENCOUNTER — Encounter (HOSPITAL_COMMUNITY): Payer: BLUE CROSS/BLUE SHIELD | Attending: Oncology | Admitting: Oncology

## 2016-03-01 ENCOUNTER — Encounter (HOSPITAL_COMMUNITY): Payer: Self-pay | Admitting: Oncology

## 2016-03-01 VITALS — BP 127/82 | HR 84 | Temp 98.0°F | Resp 18 | Wt 213.6 lb

## 2016-03-01 DIAGNOSIS — Z9851 Tubal ligation status: Secondary | ICD-10-CM | POA: Diagnosis not present

## 2016-03-01 DIAGNOSIS — D509 Iron deficiency anemia, unspecified: Secondary | ICD-10-CM | POA: Insufficient documentation

## 2016-03-01 DIAGNOSIS — D508 Other iron deficiency anemias: Secondary | ICD-10-CM | POA: Diagnosis not present

## 2016-03-01 DIAGNOSIS — Z86010 Personal history of colonic polyps: Secondary | ICD-10-CM | POA: Diagnosis not present

## 2016-03-01 DIAGNOSIS — K648 Other hemorrhoids: Secondary | ICD-10-CM | POA: Diagnosis not present

## 2016-03-01 DIAGNOSIS — K5730 Diverticulosis of large intestine without perforation or abscess without bleeding: Secondary | ICD-10-CM | POA: Diagnosis not present

## 2016-03-01 DIAGNOSIS — Z9889 Other specified postprocedural states: Secondary | ICD-10-CM | POA: Insufficient documentation

## 2016-03-01 DIAGNOSIS — Z90710 Acquired absence of both cervix and uterus: Secondary | ICD-10-CM

## 2016-03-01 DIAGNOSIS — Z87891 Personal history of nicotine dependence: Secondary | ICD-10-CM | POA: Diagnosis not present

## 2016-03-01 LAB — CBC WITH DIFFERENTIAL/PLATELET
BASOS PCT: 0 %
Basophils Absolute: 0 10*3/uL (ref 0.0–0.1)
Eosinophils Absolute: 0.4 10*3/uL (ref 0.0–0.7)
Eosinophils Relative: 4 %
HEMATOCRIT: 38.4 % (ref 36.0–46.0)
Hemoglobin: 12.7 g/dL (ref 12.0–15.0)
Lymphocytes Relative: 32 %
Lymphs Abs: 3 10*3/uL (ref 0.7–4.0)
MCH: 28.2 pg (ref 26.0–34.0)
MCHC: 33.1 g/dL (ref 30.0–36.0)
MCV: 85.3 fL (ref 78.0–100.0)
MONO ABS: 0.9 10*3/uL (ref 0.1–1.0)
MONOS PCT: 9 %
NEUTROS ABS: 5.3 10*3/uL (ref 1.7–7.7)
Neutrophils Relative %: 55 %
Platelets: 318 10*3/uL (ref 150–400)
RBC: 4.5 MIL/uL (ref 3.87–5.11)
RDW: 13.8 % (ref 11.5–15.5)
WBC: 9.6 10*3/uL (ref 4.0–10.5)

## 2016-03-01 LAB — FERRITIN: Ferritin: 73 ng/mL (ref 11–307)

## 2016-03-01 NOTE — Patient Instructions (Signed)
[place] at [facility] Discharge Instructions  RECOMMENDATIONS MADE BY THE CONSULTANT AND ANY TEST RESULTS WILL BE SENT TO YOUR REFERRING PHYSICIAN.  Labs today are stable.  Your iron tests are pending. Labs in 3 months and 6 months. Return in 6 months for follow-up.  Thank you for choosing [place] at [facility] to provide your oncology and hematology care.  To afford each patient quality time with our provider, please arrive at least 15 minutes before your scheduled appointment time.   Beginning January 23rd 2017 lab work for the [facility] will be done in the  Main lab at [facility] on 1st floor. If you have a lab appointment with the [facility] please come in thru the  Main Entrance and check in at the main information desk  You need to re-schedule your appointment should you arrive 10 or more minutes late.  We strive to give you quality time with our providers, and arriving late affects you and other patients whose appointments are after yours.  Also, if you no show three or more times for appointments you may be dismissed from the clinic at the providers discretion.     Again, thank you for choosing [facility].  Our hope is that these requests will decrease the amount of time that you wait before being seen by our physicians.       _____________________________________________________________  Should you have questions after your visit to [facility], please contact our office at [phone] between the hours of 8:30 a.m. and 4:30 p.m.  Voicemails left after 4:30 p.m. will not be returned until the following business day.  For prescription refill requests, have your pharmacy contact our office.         Resources For Cancer Patients and their Caregivers ? American Cancer Society: Can assist with transportation, wigs, general needs, runs Look Good Feel Better.        802-327-0763 ? Cancer  Care: Provides financial assistance, online support groups, medication/co-pay assistance.  1-800-813-HOPE 385-412-1680) ? [facility] Assists [place] cancer patients and their families through emotional , educational and financial support.  (972)637-2794 ? [place] DSS Where to apply for food stamps, Medicaid and utility assistance. (661)334-2299 ? [facility]: Transportation to medical appointments. 862-019-0334 ? Social Security Administration: May apply for disability if have a Stage IV cancer. 623 024 6151 903-215-0414 ? [employer], Disability and Transit Services: Assists with nutrition, care and transit needs. [facility] Support Programs: @10RELATIVEDAYS @ > Cancer Support Group  2nd Tuesday of the month 1pm-2pm, Journey Room  > Creative Journey  3rd Tuesday of the month 1130am-1pm, Journey Room  > Look Good Feel Better  1st Wednesday of the month 10am-12 noon, Journey Room (Call [facility] to register [phone])

## 2016-03-01 NOTE — Progress Notes (Signed)
Kathy Mariscal, MD [place] [address]  Anemia, iron deficiency  CURRENT THERAPY: IV iron replacement when indicated.  INTERVAL HISTORY: Kathy Dawson 60 y.o. female returns for followup of iron deficiency anemia with intolerance to oral iron (including Rx strength PO iron), S/P EGD by Dr. Gala Romney on 09/16/2014 demonstrating a single duodenal erosion of doubtful clinical significance, S/P Maloney dilation of the esophagus and colonoscopy on 12/08/2011 by Dr. Gala Romney showing internal hemorrhoids, diverticulosis of sigmoid colon, and colon polyp.  She notes an increase in headache frequency, which she reports is common when her iron is low.  She denies any vaginal bleeding, blood in stool, and black tarry stool.  She denies any known signs of blood loss.  She denies any complaints.  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  Review of Systems  Constitutional: Negative for fever, chills and weight loss.  HENT: Negative for nosebleeds.   Eyes: Negative.   Respiratory: Negative.  Negative for hemoptysis.   Cardiovascular: Negative.   Gastrointestinal: Negative.  Negative for blood in stool and melena.  Genitourinary: Negative.  Negative for hematuria.  Musculoskeletal: Negative.   Skin: Negative.   Neurological: Positive for headaches.  Endo/Heme/Allergies: Negative.  Does not bruise/bleed easily.  Psychiatric/Behavioral: Negative.     Past Medical History  Diagnosis Date  . Knee pain   . Complication of anesthesia 14 yrs. ago    woke up during the appendectomy  . GERD (gastroesophageal reflux disease)     otc med prn  . Arthritis     knees, hips, elbow  . Anemia     Hx  . Anemia, iron deficiency 06/09/2013    Past Surgical History  Procedure Laterality Date  . Tubal ligation    . Appendectomy    . Knee surgery Right 2011    arthroscopy  . Bilateral foot surgery      hammer toes  . Colonoscopy  12/08/2011   [id number] hemorrhoids/Sigmoid diverticulosis. Colonic polyp (tubular adenoma). Surveillance 2018  . 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 Esophagogastroduodenoscopy N/A 09/16/2014    Procedure: ESOPHAGOGASTRODUODENOSCOPY (EGD);  Surgeon: Daneil Dolin, MD;  [facility];  Service: Endoscopy;  Laterality: N/A;  1030am  . Maloney dilation N/A 09/16/2014    Procedure: Venia Minks DILATION;  Surgeon: Daneil Dolin, MD;  [facility];  Service: Endoscopy;  Laterality: N/A;  . Total hip arthroplasty Right 11/04/2014    Procedure: TOTAL HIP ARTHROPLASTY ANTERIOR APPROACH, Possible Local Bonegrafting Acetabular Cyst;  Surgeon: Marybelle Killings, MD;  Location: [place];  Service: Orthopedics;  Laterality: Right;  . Anterior hip revision Right 11/18/2014    Procedure: Right Total Hip Arthroplasty Femoral Revision-Anteior approach;  Surgeon: Marybelle Killings, MD;  Location: [place];  Service: Orthopedics;  Laterality: Right;  . Bladder suspension N/A 02/02/2016    Procedure: TRANSVAGINAL TAPE (TVT) Mid-Urethral Sling PROCEDURE;  Surgeon: Azucena Fallen, MD;  Location: [facility];  Service: Gynecology;  Laterality: N/A;  . Cystoscopy N/A 02/02/2016    Procedure: CYSTOSCOPY;  Surgeon: Azucena Fallen, MD;  Location: [place];  Service: Gynecology;  Laterality: N/A;  . Vaginal hysterectomy N/A 02/02/2016    Procedure: HYSTERECTOMY VAGINAL;  Surgeon: Azucena Fallen, MD;  Location: [facility];  Service: Gynecology;  Laterality: N/A;    Family History  Problem Relation Age of Onset  . Colon polyps Father   . Cancer Father   . Colon cancer Neg  Hx     Social History   Social History  . Marital Status: Married    Spouse Name: N/A  . Number of Children: N/A  . Years of Education: N/A   Occupational History  . tobacco farmer    Social History Main Topics  . Smoking status: Former Smoker -- 0.50 packs/day for 10 years    Types: Cigarettes  . Smokeless tobacco: Never Used  . Alcohol Use: No  . Drug Use: No  .  Sexual Activity: Not Currently    Birth Control/ Protection: None   Other Topics Concern  . None   Social History Narrative     PHYSICAL EXAMINATION  ECOG PERFORMANCE STATUS: 1 - Symptomatic but completely ambulatory  Filed Vitals:   03/01/16 1100  BP: 127/82  Pulse: 84  Temp: 98 F (36.7 C)  Resp: 18    GENERAL:alert, no distress, well nourished, well developed, comfortable, cooperative, obese, smiling and unaccompanied SKIN: skin color, texture, turgor are normal, no rashes or significant lesions HEAD: Normocephalic, No masses, lesions, tenderness or abnormalities EYES: normal, EOMI, Conjunctiva are pink and non-injected EARS: External ears normal OROPHARYNX:lips, buccal mucosa, and tongue normal and mucous membranes are moist  NECK: supple, no adenopathy, trachea midline LYMPH:  no palpable lymphadenopathy BREAST:not examined LUNGS: clear to auscultation  HEART: regular rate & rhythm ABDOMEN:abdomen soft, non-tender, obese and normal bowel sounds BACK: Back symmetric, no curvature. EXTREMITIES:less then 2 second capillary refill, no joint deformities, effusion, or inflammation, no skin discoloration, no cyanosis  NEURO: alert & oriented x 3 with fluent speech, no focal motor/sensory deficits, gait normal   LABORATORY DATA: CBC    Component Value Date/Time   WBC 9.6 03/01/2016 0942   RBC 4.50 03/01/2016 0942   RBC 4.66 07/30/2013 0906   HGB 12.7 03/01/2016 0942   HCT 38.4 03/01/2016 0942   PLT 318 03/01/2016 0942   MCV 85.3 03/01/2016 0942   MCH 28.2 03/01/2016 0942   MCHC 33.1 03/01/2016 0942   RDW 13.8 03/01/2016 0942   LYMPHSABS 3.0 03/01/2016 0942   MONOABS 0.9 03/01/2016 0942   EOSABS 0.4 03/01/2016 0942   BASOSABS 0.0 03/01/2016 0942      Chemistry      Component Value Date/Time   NA 135 02/03/2016 0605   K 4.3 02/03/2016 0605   CL 103 02/03/2016 0605   CO2 25 02/03/2016 0605   BUN 11 02/03/2016 0605   CREATININE 0.58 02/03/2016 0605        Component Value Date/Time   CALCIUM 8.5* 02/03/2016 0605   ALKPHOS 114 10/22/2015 1844   AST 14* 10/22/2015 1844   ALT 17 10/22/2015 1844   BILITOT 0.5 10/22/2015 1844     Lab Results  Component Value Date   IRON 15* 06/04/2013   TIBC 400 06/04/2013   FERRITIN 73 03/01/2016      PENDING LABS:   RADIOGRAPHIC STUDIES:  No results found.   PATHOLOGY:    ASSESSMENT AND PLAN:  Anemia, iron deficiency Iron deficiency anemia with intolerance to oral iron (including Rx strength PO iron), S/P EGD by Dr. Gala Romney on 09/16/2014 demonstrating a single duodenal erosion of doubtful clinical significance, S/P Maloney dilation of the esophagus and colonoscopy on 12/08/2011 by Dr. Gala Romney showing internal hemorrhoids, diverticulosis of sigmoid colon, and colon polyp.  Oncology Flowsheet 09/02/2015 09/05/2015  ferric gluconate (NULECIT) IV 125 mg 125 mg   S/P Total vaginal hysterectomy, TVT sling (Exact), Cystoscopy on 02/02/2016 by Dr. Azucena Fallen.  She reports that  she is transferring her primary medical care to Dr. Benjie Karvonen.  She is overdue for mammogram and I have recommended we get this set up for her, but she reports that Dr. Benjie Karvonen wants her mammogram to be performed more local for improved monitoring/following of her primary care needs.  Labs today: CBC diff, ferritin. I personally reviewed and went over laboratory results with the patient.  The results are noted within this dictation.  Her ferritin is less than 100 and based upon her iron deficit calculation, her iron deficit is approximately 250 mg.  As a result, we will get her scheduled for 2 doses of ferric gluconate, 125 mg each.  Supportive therapy plan is built accordingly.  She notes development of a headache when her iron is low.  She notes a headache and therefore, she is suspicious for iron deficiency.  Labs every 3 months: CBC diff, ferritin.  Return in 6 months for follow-up.    ORDERS PLACED FOR THIS ENCOUNTER: No orders of  the defined types were placed in this encounter.    MEDICATIONS PRESCRIBED THIS ENCOUNTER: No orders of the defined types were placed in this encounter.    THERAPY PLAN:  We will continue to monitor labs, including iron studies, and provide IV iron as indicated.  All questions were answered. The patient knows to call the clinic with any problems, questions or concerns. We can certainly see the patient much sooner if necessary.  Patient and plan discussed with Dr. Ancil Linsey and she is in agreement with the aforementioned.   This note is electronically signed by: Doy Mince 03/01/2016 9:27 PM

## 2016-03-01 NOTE — Assessment & Plan Note (Addendum)
Iron deficiency anemia with intolerance to oral iron (including Rx strength PO iron), S/P EGD by Dr. Gala Romney on 09/16/2014 demonstrating a single duodenal erosion of doubtful clinical significance, S/P Maloney dilation of the esophagus and colonoscopy on 12/08/2011 by Dr. Gala Romney showing internal hemorrhoids, diverticulosis of sigmoid colon, and colon polyp.  Oncology Flowsheet 09/02/2015 09/05/2015  ferric gluconate (NULECIT) IV 125 mg 125 mg   S/P Total vaginal hysterectomy, TVT sling (Exact), Cystoscopy on 02/02/2016 by Dr. Azucena Fallen.  She reports that she is transferring her primary medical care to Dr. Benjie Karvonen.  She is overdue for mammogram and I have recommended we get this set up for her, but she reports that Dr. Benjie Karvonen wants her mammogram to be performed more local for improved monitoring/following of her primary care needs.  Labs today: CBC diff, ferritin. I personally reviewed and went over laboratory results with the patient.  The results are noted within this dictation.  Her ferritin is less than 100 and based upon her iron deficit calculation, her iron deficit is approximately 250 mg.  As a result, we will get her scheduled for 2 doses of ferric gluconate, 125 mg each.  Supportive therapy plan is built accordingly.  She notes development of a headache when her iron is low.  She notes a headache and therefore, she is suspicious for iron deficiency.  Labs every 3 months: CBC diff, ferritin.  Return in 6 months for follow-up.

## 2016-03-15 ENCOUNTER — Encounter (HOSPITAL_BASED_OUTPATIENT_CLINIC_OR_DEPARTMENT_OTHER): Payer: BLUE CROSS/BLUE SHIELD

## 2016-03-15 VITALS — BP 112/64 | HR 66 | Temp 98.2°F | Resp 18 | Wt 213.8 lb

## 2016-03-15 DIAGNOSIS — K648 Other hemorrhoids: Secondary | ICD-10-CM | POA: Diagnosis not present

## 2016-03-15 DIAGNOSIS — D508 Other iron deficiency anemias: Secondary | ICD-10-CM

## 2016-03-15 DIAGNOSIS — D509 Iron deficiency anemia, unspecified: Secondary | ICD-10-CM

## 2016-03-15 MED ORDER — SODIUM CHLORIDE 0.9 % IV SOLN
Freq: Once | INTRAVENOUS | Status: AC
  Administered 2016-03-15: 15:00:00 via INTRAVENOUS

## 2016-03-15 MED ORDER — SODIUM CHLORIDE 0.9 % IV SOLN
250.0000 mg | Freq: Once | INTRAVENOUS | Status: DC
  Filled 2016-03-15: qty 20

## 2016-03-15 MED ORDER — SODIUM CHLORIDE 0.9 % IV SOLN
125.0000 mg | Freq: Once | INTRAVENOUS | Status: AC
  Administered 2016-03-15: 125 mg via INTRAVENOUS
  Filled 2016-03-15: qty 10

## 2016-03-15 NOTE — Patient Instructions (Signed)
[place] at [facility] Discharge Instructions  RECOMMENDATIONS MADE BY THE CONSULTANT AND ANY TEST RESULTS WILL BE SENT TO YOUR REFERRING PHYSICIAN.  Ferric gluconate 125 mg iron infusion given today as ordered.  Thank you for choosing [place] at [facility] to provide your oncology and hematology care.  To afford each patient quality time with our provider, please arrive at least 15 minutes before your scheduled appointment time.   Beginning January 23rd 2017 lab work for the [facility] will be done in the  Main lab at [facility] on 1st floor. If you have a lab appointment with the [facility] please come in thru the  Main Entrance and check in at the main information desk  You need to re-schedule your appointment should you arrive 10 or more minutes late.  We strive to give you quality time with our providers, and arriving late affects you and other patients whose appointments are after yours.  Also, if you no show three or more times for appointments you may be dismissed from the clinic at the providers discretion.     Again, thank you for choosing [facility].  Our hope is that these requests will decrease the amount of time that you wait before being seen by our physicians.       _____________________________________________________________  Should you have questions after your visit to [facility], please contact our office at [phone] between the hours of 8:30 a.m. and 4:30 p.m.  Voicemails left after 4:30 p.m. will not be returned until the following business day.  For prescription refill requests, have your pharmacy contact our office.         Resources For Cancer Patients and their Caregivers ? American Cancer Society: Can assist with transportation, wigs, general needs, runs Look Good Feel Better.        6363999889 ? Cancer Care: Provides financial assistance, online support groups,  medication/co-pay assistance.  1-800-813-HOPE 719 751 4303) ? [place] Assists [place] cancer patients and their families through emotional , educational and financial support.  (830) 417-3153 ? [place] DSS Where to apply for food stamps, Medicaid and utility assistance. 8436643530 ? RCATS: Transportation to medical appointments. 820-883-0594 ? Social Security Administration: May apply for disability if have a Stage IV cancer. (650)049-2617 540-128-8064 ? [employer], Disability and Transit Services: Assists with nutrition, care and transit needs. [place] Support Programs: @10RELATIVEDAYS @ > Cancer Support Group  2nd Tuesday of the month 1pm-2pm, Journey Room  > Creative Journey  3rd Tuesday of the month 1130am-1pm, Journey Room  > Look Good Feel Better  1st Wednesday of the month 10am-12 noon, Journey Room (Call [place] to register [phone])

## 2016-03-22 ENCOUNTER — Encounter (HOSPITAL_COMMUNITY): Payer: BLUE CROSS/BLUE SHIELD | Attending: Oncology

## 2016-03-22 VITALS — BP 111/64 | HR 78 | Temp 98.0°F | Resp 18 | Wt 214.0 lb

## 2016-03-22 DIAGNOSIS — K648 Other hemorrhoids: Secondary | ICD-10-CM | POA: Diagnosis not present

## 2016-03-22 DIAGNOSIS — D508 Other iron deficiency anemias: Secondary | ICD-10-CM | POA: Diagnosis not present

## 2016-03-22 DIAGNOSIS — Z9889 Other specified postprocedural states: Secondary | ICD-10-CM | POA: Insufficient documentation

## 2016-03-22 DIAGNOSIS — Z9851 Tubal ligation status: Secondary | ICD-10-CM | POA: Insufficient documentation

## 2016-03-22 DIAGNOSIS — D509 Iron deficiency anemia, unspecified: Secondary | ICD-10-CM | POA: Insufficient documentation

## 2016-03-22 DIAGNOSIS — Z87891 Personal history of nicotine dependence: Secondary | ICD-10-CM | POA: Insufficient documentation

## 2016-03-22 MED ORDER — SODIUM CHLORIDE 0.9 % IV SOLN
250.0000 mg | Freq: Once | INTRAVENOUS | Status: DC
  Filled 2016-03-22: qty 20

## 2016-03-22 MED ORDER — SODIUM CHLORIDE 0.9 % IV SOLN
125.0000 mg | Freq: Once | INTRAVENOUS | Status: AC
  Administered 2016-03-22 (×2): 125 mg via INTRAVENOUS
  Filled 2016-03-22: qty 10

## 2016-03-22 MED ORDER — SODIUM CHLORIDE 0.9 % IV SOLN
Freq: Once | INTRAVENOUS | Status: AC
  Administered 2016-03-22: 15:00:00 via INTRAVENOUS

## 2016-03-22 NOTE — Progress Notes (Signed)
Tolerated infusion w/o adverse reaction.  Alert, in no distress.  VSS.  Discharged ambulatory.  

## 2016-03-22 NOTE — Patient Instructions (Signed)
[place] at [facility] Discharge Instructions  RECOMMENDATIONS MADE BY THE CONSULTANT AND ANY TEST RESULTS WILL BE SENT TO YOUR REFERRING PHYSICIAN.  Ferric gluconate 125 mg iron infusion given today as ordered. Return as scheduled.  Thank you for choosing [place] at [facility] to provide your oncology and hematology care.  To afford each patient quality time with our provider, please arrive at least 15 minutes before your scheduled appointment time.   Beginning January 23rd 2017 lab work for the [facility] will be done in the  Main lab at [facility] on 1st floor. If you have a lab appointment with the [facility] please come in thru the  Main Entrance and check in at the main information desk  You need to re-schedule your appointment should you arrive 10 or more minutes late.  We strive to give you quality time with our providers, and arriving late affects you and other patients whose appointments are after yours.  Also, if you no show three or more times for appointments you may be dismissed from the clinic at the providers discretion.     Again, thank you for choosing [facility].  Our hope is that these requests will decrease the amount of time that you wait before being seen by our physicians.       _____________________________________________________________  Should you have questions after your visit to [facility], please contact our office at [phone] between the hours of 8:30 a.m. and 4:30 p.m.  Voicemails left after 4:30 p.m. will not be returned until the following business day.  For prescription refill requests, have your pharmacy contact our office.         Resources For Cancer Patients and their Caregivers ? American Cancer Society: Can assist with transportation, wigs, general needs, runs Look Good Feel Better.        857-712-4819 ? Cancer Care: Provides financial assistance,  online support groups, medication/co-pay assistance.  1-800-813-HOPE 9793151723) ? [place] Assists [place] cancer patients and their families through emotional , educational and financial support.  (782)812-0063 ? [place] DSS Where to apply for food stamps, Medicaid and utility assistance. 629 711 3384 ? RCATS: Transportation to medical appointments. 615-347-3600 ? Social Security Administration: May apply for disability if have a Stage IV cancer. 6152788340 (432)627-2177 ? [employer], Disability and Transit Services: Assists with nutrition, care and transit needs. [place] Support Programs: @10RELATIVEDAYS @ > Cancer Support Group  2nd Tuesday of the month 1pm-2pm, Journey Room  > Creative Journey  3rd Tuesday of the month 1130am-1pm, Journey Room  > Look Good Feel Better  1st Wednesday of the month 10am-12 noon, Journey Room (Call [place] to register [phone])

## 2016-06-22 ENCOUNTER — Other Ambulatory Visit (HOSPITAL_COMMUNITY): Payer: Self-pay | Admitting: Obstetrics & Gynecology

## 2016-06-22 DIAGNOSIS — Z1231 Encounter for screening mammogram for malignant neoplasm of breast: Secondary | ICD-10-CM

## 2016-07-01 ENCOUNTER — Ambulatory Visit (HOSPITAL_COMMUNITY)
Admission: RE | Admit: 2016-07-01 | Discharge: 2016-07-01 | Disposition: A | Discharge status: Home / Self Care | Payer: BLUE CROSS/BLUE SHIELD | Source: Ambulatory Visit | Attending: Obstetrics & Gynecology | Admitting: Obstetrics & Gynecology

## 2016-07-01 DIAGNOSIS — Z1231 Encounter for screening mammogram for malignant neoplasm of breast: Secondary | ICD-10-CM

## 2016-07-04 ENCOUNTER — Emergency Department (HOSPITAL_COMMUNITY)
Admission: EM | Admit: 2016-07-04 | Discharge: 2016-07-04 | Disposition: A | Discharge status: Home / Self Care | Payer: BLUE CROSS/BLUE SHIELD | Attending: Emergency Medicine | Admitting: Emergency Medicine

## 2016-07-04 ENCOUNTER — Emergency Department (HOSPITAL_COMMUNITY): Payer: BLUE CROSS/BLUE SHIELD

## 2016-07-04 ENCOUNTER — Encounter (HOSPITAL_COMMUNITY): Payer: Self-pay | Admitting: Emergency Medicine

## 2016-07-04 DIAGNOSIS — R110 Nausea: Secondary | ICD-10-CM | POA: Diagnosis not present

## 2016-07-04 DIAGNOSIS — Z79899 Other long term (current) drug therapy: Secondary | ICD-10-CM | POA: Insufficient documentation

## 2016-07-04 DIAGNOSIS — Z87891 Personal history of nicotine dependence: Secondary | ICD-10-CM | POA: Insufficient documentation

## 2016-07-04 DIAGNOSIS — R1030 Lower abdominal pain, unspecified: Secondary | ICD-10-CM | POA: Diagnosis not present

## 2016-07-04 DIAGNOSIS — R0602 Shortness of breath: Secondary | ICD-10-CM

## 2016-07-04 LAB — CBC WITH DIFFERENTIAL/PLATELET
BASOS PCT: 1 %
Basophils Absolute: 0 10*3/uL (ref 0.0–0.1)
EOS ABS: 0.2 10*3/uL (ref 0.0–0.7)
Eosinophils Relative: 3 %
HEMATOCRIT: 37.9 % (ref 36.0–46.0)
HEMOGLOBIN: 13 g/dL (ref 12.0–15.0)
LYMPHS ABS: 2.8 10*3/uL (ref 0.7–4.0)
Lymphocytes Relative: 54 %
MCH: 31.3 pg (ref 26.0–34.0)
MCHC: 34.3 g/dL (ref 30.0–36.0)
MCV: 91.3 fL (ref 78.0–100.0)
MONOS PCT: 10 %
Monocytes Absolute: 0.5 10*3/uL (ref 0.1–1.0)
NEUTROS ABS: 1.6 10*3/uL — AB (ref 1.7–7.7)
NEUTROS PCT: 32 %
Platelets: 184 10*3/uL (ref 150–400)
RBC: 4.15 MIL/uL (ref 3.87–5.11)
RDW: 14.8 % (ref 11.5–15.5)
WBC: 5.1 10*3/uL (ref 4.0–10.5)

## 2016-07-04 LAB — COMPREHENSIVE METABOLIC PANEL
ALBUMIN: 3.5 g/dL (ref 3.5–5.0)
ALK PHOS: 79 U/L (ref 38–126)
ALT: 73 U/L — AB (ref 14–54)
AST: 46 U/L — ABNORMAL HIGH (ref 15–41)
Anion gap: 8 (ref 5–15)
BILIRUBIN TOTAL: 0.8 mg/dL (ref 0.3–1.2)
BUN: 15 mg/dL (ref 6–20)
CALCIUM: 8.8 mg/dL — AB (ref 8.9–10.3)
CO2: 23 mmol/L (ref 22–32)
CREATININE: 0.67 mg/dL (ref 0.44–1.00)
Chloride: 109 mmol/L (ref 101–111)
GFR calc Af Amer: >60 mL/min (ref >60)
GFR calc non Af Amer: >60 mL/min (ref >60)
GLUCOSE: 110 mg/dL — AB (ref 65–99)
Potassium: 3.3 mmol/L — ABNORMAL LOW (ref 3.5–5.1)
SODIUM: 140 mmol/L (ref 135–145)
TOTAL PROTEIN: 6.5 g/dL (ref 6.5–8.1)

## 2016-07-04 LAB — URINE MICROSCOPIC-ADD ON

## 2016-07-04 LAB — URINALYSIS, ROUTINE W REFLEX MICROSCOPIC
BILIRUBIN URINE: NEGATIVE
GLUCOSE, UA: NEGATIVE mg/dL
KETONES UR: NEGATIVE mg/dL
Nitrite: NEGATIVE
PH: 6 (ref 5.0–8.0)
Protein, ur: NEGATIVE mg/dL
Specific Gravity, Urine: 1.01 (ref 1.005–1.030)

## 2016-07-04 LAB — BRAIN NATRIURETIC PEPTIDE: B Natriuretic Peptide: 47 pg/mL (ref 0.0–100.0)

## 2016-07-04 LAB — TROPONIN I: Troponin I: <0.03 ng/mL (ref <0.03)

## 2016-07-04 NOTE — ED Notes (Addendum)
Pt reports SOB increasing over the last week. Swelling noted to abdomen and lower extremities. Took shot of Enbrel on Friday and feels swelling has increased and SOB worsening today. Intermittent cough. Has also noted difficulty with urination

## 2016-07-04 NOTE — ED Notes (Signed)
Returned from  X-ray

## 2016-07-04 NOTE — ED Provider Notes (Signed)
[place] DEPT Provider Note   CSN: [id number] Arrival date & time: 07/04/16  1354     History   Chief Complaint Chief Complaint  Patient presents with  . Shortness of Breath    HPI Kathy Dawson is a 60 y.o. female.  HPI  Patient presents with concern of dyspnea. Symptoms began about 10 days ago, have been progressive and with generalized sense of discomfort. No focal pain, nausea, no vomiting. Patient does have early satiety, but no postprandial changes. No fever, no chills. Patient has notable recent change in medication, with weekly injections of Enbrel, last 2 days ago.   Past Medical History:  Diagnosis Date  . Anemia    Hx  . Anemia, iron deficiency 06/09/2013  . Arthritis    knees, hips, elbow  . Complication of anesthesia 14 yrs. ago   woke up during the appendectomy  . GERD (gastroesophageal reflux disease)    otc med prn  . Knee pain     Patient Active Problem List   Diagnosis Date Noted  . Urinary, incontinence, stress female 02/02/2016  . 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 GERD (gastroesophageal reflux disease) 08/29/2014  . Tubular adenoma of colon 08/29/2014  . Dysphagia, pharyngoesophageal phase 08/29/2014  . Anemia, iron deficiency 06/09/2013  . Difficulty in walking(719.7) 02/05/2013  . Stiffness of right knee 02/05/2013  . Rectal bleeding 11/27/2011  . Constipation 11/27/2011  . DERANGEMENT MENISCUS 08/26/2008  . KNEE, ARTHRITIS, DEGEN./OSTEO 07/25/2008  . KNEE PAIN 07/25/2008    Past Surgical History:  Procedure Laterality Date  . ANTERIOR HIP REVISION Right 11/18/2014   Procedure: Right Total Hip Arthroplasty Femoral Revision-Anteior approach;  Surgeon: Marybelle Killings, MD;  Location: [facility];  Service: Orthopedics;  Laterality: Right;  . APPENDECTOMY    . bilateral foot surgery     hammer toes  . BLADDER SUSPENSION N/A 02/02/2016   Procedure: TRANSVAGINAL TAPE (TVT) Mid-Urethral  Sling PROCEDURE;  Surgeon: Azucena Fallen, MD;  Location: [facility];  Service: Gynecology;  Laterality: N/A;  . COLONOSCOPY  12/08/2011   [id number] hemorrhoids/Sigmoid diverticulosis. Colonic polyp (tubular adenoma). Surveillance 2018  . CYSTOSCOPY N/A 02/02/2016   Procedure: CYSTOSCOPY;  Surgeon: Azucena Fallen, MD;  Location: [facility];  Service: Gynecology;  Laterality: N/A;  . ESOPHAGOGASTRODUODENOSCOPY N/A 09/16/2014   Procedure: ESOPHAGOGASTRODUODENOSCOPY (EGD);  Surgeon: Daneil Dolin, MD;  [facility];  Service: Endoscopy;  Laterality: N/A;  1030am  . KNEE SURGERY Right 2011   arthroscopy  . MALONEY DILATION N/A 09/16/2014   Procedure: Venia Minks DILATION;  Surgeon: Daneil Dolin, MD;  [facility];  Service: Endoscopy;  Laterality: N/A;  . 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 TOTAL HIP ARTHROPLASTY Right 11/04/2014   Procedure: TOTAL HIP ARTHROPLASTY ANTERIOR APPROACH, Possible Local Bonegrafting Acetabular Cyst;  Surgeon: Marybelle Killings, MD;  Location: [facility];  Service: Orthopedics;  Laterality: Right;  . TUBAL LIGATION    . VAGINAL HYSTERECTOMY N/A 02/02/2016   Procedure: HYSTERECTOMY VAGINAL;  Surgeon: Azucena Fallen, MD;  Location: [facility];  Service: Gynecology;  Laterality: N/A;    OB History    Gravida Para Term Preterm AB Living   4 3 3   1      SAB TAB Ectopic Multiple Live Births   1               Home Medications    Prior to Admission medications   Medication Sig  Start Date End Date Taking? Authorizing Provider  acetaminophen (TYLENOL) 500 MG tablet Take 500 mg by mouth every 6 (six) hours as needed for mild pain. Reported on 03/01/2016    Historical Provider, MD  Cholecalciferol (VITAMIN D3) 1000 UNITS CAPS Take 1 capsule by mouth daily. Reported on 03/01/2016    Historical Provider, MD  Digestive Enzymes (PAPAYA ENZYME PO) Take 1 tablet by mouth daily.    Historical Provider, MD  ibuprofen (ADVIL,MOTRIN) 200 MG tablet Take 200 mg by mouth every 6 (six)  hours as needed for moderate pain.    Historical Provider, MD  Lovell Sheehan OIL Take 3 drops by mouth 2 (two) times daily. Reported on 03/01/2016    Historical Provider, MD  Omega-3 Fatty Acids (FISH OIL) 1000 MG CAPS Take 1,000 mg by mouth. Reported on 03/01/2016    Historical Provider, MD  oxyCODONE-acetaminophen (ROXICET) 5-325 MG tablet Take 1 tablet by mouth every 4 (four) hours as needed for severe pain. Patient not taking: Reported on 03/01/2016 02/03/16   Azucena Fallen, MD  senna-docusate (SENOKOT-S) 8.6-50 MG tablet Take 1 tablet by mouth at bedtime. Patient not taking: Reported on 03/01/2016 02/03/16   Azucena Fallen, MD    Family History Family History  Problem Relation Age of Onset  . Colon polyps Father   . Cancer Father   . Heart disease Father   . Heart disease Other   . Colon cancer Neg Hx     Social History Social History  Substance Use Topics  . Smoking status: Former Smoker    Packs/day: 0.50    Years: 10.00    Types: Cigarettes  . Smokeless tobacco: Never Used  . Alcohol use No     Allergies   Clindamycin/lincomycin and Prochlorperazine   Review of Systems Review of Systems  Constitutional:       Per HPI, otherwise negative  HENT:       Per HPI, otherwise negative  Respiratory:       Per HPI, otherwise negative  Cardiovascular:       Per HPI, otherwise negative  Gastrointestinal: Positive for nausea. Negative for vomiting.  Endocrine:       Negative aside from HPI  Genitourinary:       Neg aside from HPI   Musculoskeletal:       Per HPI, otherwise negative  Skin: Negative.   Neurological: Negative for syncope.     Physical Exam Updated Vital Signs BP 137/73   Pulse 79   Temp 97.5 F (36.4 C) (Oral)   Resp 16   Ht 5\' 5"  (1.651 m)   Wt 232 lb 6 oz (105.4 kg)   SpO2 98%   BMI 38.67 kg/m   Physical Exam  Constitutional: She is oriented to person, place, and time. She appears well-developed and well-nourished. No distress.  HENT:  Head:  Normocephalic and atraumatic.  Eyes: Conjunctivae and EOM are normal.  Cardiovascular: Normal rate and regular rhythm.   Pulmonary/Chest: Effort normal and breath sounds normal. No stridor. No respiratory distress. She has no wheezes.  Abdominal: She exhibits no distension.  Musculoskeletal: She exhibits no edema.  Neurological: She is alert and oriented to person, place, and time. No cranial nerve deficit.  Skin: Skin is warm and dry.  Psychiatric: She has a normal mood and affect.  Nursing note and vitals reviewed.    ED Treatments / Results  Labs (all labs ordered are listed, but only abnormal results are displayed) Labs Reviewed  CBC WITH DIFFERENTIAL/PLATELET -  Abnormal; Notable for the following:       Result Value   Neutro Abs 1.6 (*)    All other components within normal limits  COMPREHENSIVE METABOLIC PANEL  URINALYSIS, ROUTINE W REFLEX MICROSCOPIC (NOT AT [facility])  BRAIN NATRIURETIC PEPTIDE  TROPONIN I    EKG  EKG Interpretation  Date/Time:  Sunday July 04 2016 14:25:41 EST Ventricular Rate:  86 PR Interval:    QRS Duration: 99 QT Interval:  383 QTC Calculation: 459 R Axis:   -50 Text Interpretation:  Sinus rhythm LAD, consider left anterior fascicular block RSR' in V1 or V2, right VCD or RVH Baseline wander in lead(s) III Abnormal ekg Confirmed by Carmin Muskrat  MD [phone]) on 07/04/2016 2:28:57 PM       Radiology Ct Abdomen Pelvis Wo Contrast  Result Date: 07/04/2016 CLINICAL DATA:  60 year old female with lower abdominal pain and swelling. Lower extremity swelling. Decreased urine output. Initial encounter. EXAM: CT ABDOMEN AND PELVIS WITHOUT CONTRAST TECHNIQUE: Multidetector CT imaging of the abdomen and pelvis was performed following the standard protocol without IV contrast. COMPARISON:  Right hip MRI 07/31/2014. FINDINGS: Lower chest: No pericardial or pleural effusion. Minor atelectasis in the lingula. Otherwise negative lung bases. No upper abdominal  free air. Hepatobiliary: Diminutive gallbladder.  Negative noncontrast liver. Pancreas: Negative. Spleen: Negative. Adrenals/Urinary Tract: Negative adrenal glands. Punctate right lower pole nephrolithiasis. No right hydronephrosis, hydroureter, perinephric or periureteral stranding. Negative noncontrast left kidney. Negative left ureter. Unremarkable urinary bladder. Stomach/Bowel: Decompressed rectum. Mild diverticulosis of the sigmoid colon with no active inflammation. Negative left colon and transverse colon aside from mild redundancy and retained stool. Retained stool in the right colon. Negative terminal ileum. The appendix appears to be surgically absent, with surgical clips about the cecum. No pericecal inflammation. No dilated small bowel. Decompressed duodenum. Negative noncontrast stomach. Vascular/Lymphatic: No significant calcified atherosclerosis. Vascular patency not evaluated in the absence of IV contrast. New line no lymphadenopathy. Reproductive: Surgically absent uterus.  Negative adnexa. Other: No pelvic free fluid. Musculoskeletal: Lumbar disc degeneration. Right hip arthroplasty since 2015. No acute osseous abnormality identified. IMPRESSION: 1. No acute or inflammatory process identified in the noncontrast abdomen or pelvis. 2. Punctate right lower pole nephrolithiasis with no obstructive uropathy. 3. Mild sigmoid diverticulosis. Surgically absent uterus and appendix. Electronically Signed   By: Genevie Ann M.D.   On: 07/04/2016 17:40   Dg Chest 2 View  Result Date: 07/04/2016 CLINICAL DATA:  Shortness of breath for 2 weeks which markedly worsened today. EXAM: CHEST  2 VIEW COMPARISON:  PA and lateral chest 10/24/2014. FINDINGS: Lungs are clear. Heart size is normal. No pneumothorax or pleural effusion. No focal bony abnormality. IMPRESSION: Negative chest. Electronically Signed   By: Inge Rise M.D.   On: 07/04/2016 15:08    Procedures Procedures (including critical care  time)  Medications Ordered in ED Medications - No data to display   Initial Impression / Assessment and Plan / ED Course  I have reviewed the triage vital signs and the nursing notes.  Pertinent labs & imaging results that were available during my care of the patient were reviewed by me and considered in my medical decision making (see chart for details).  Clinical Course     4:37 PM On repeat exam the patient states that she has lower abdominal pain, changes in urinary pattern, with sense of frequency of voiding.  6:51 PM Patient aware of all findings including reassuring CT results per We discussed the importance of following up  with rheumatology tomorrow via telephone, and keeping primary care for him as well.   Final Clinical Impressions(s) / ED Diagnoses  H with multiple medical issues presents with dyspnea, she has a notable history of recent initiation of medication for presumed rheumatologic disease. Here the evaluation is largely reassuring, with no evidence for CHF, pneumonia, ACS, renal or hepatic failure. Patient was awake and alert, with appropriate oxygenation, vital signs throughout several hours of monitoring. Patient appropriate for discharge with further evaluation, management as an outpatient.   Carmin Muskrat, MD 07/04/16 [phone]

## 2016-07-04 NOTE — ED Triage Notes (Signed)
Pt states she started having SOB over the past 2 weeks, became much worse today. Pt states she has difficulty walking due to SOB. Pt reports fluid buildup, feels like she is tight from her chest through her body. Pt with 4+ LE edema. Pt reports recent treatment with Aromas. Pt also has been on steroids and a week later started Embrel. Pt states she is not making urine as normal. Doesn't feel the need to urinate. Pt also states she has had severe back pain for approx a week and a half.

## 2016-07-04 NOTE — Discharge Instructions (Signed)
As discussed, your evaluation today has been largely reassuring.  But, it is important that you monitor your condition carefully, and do not hesitate to return to the ED if you develop new, or concerning changes in your condition. ? ?Otherwise, please follow-up with your physician for appropriate ongoing care. ? ?

## 2016-09-01 ENCOUNTER — Other Ambulatory Visit (HOSPITAL_COMMUNITY): Payer: BLUE CROSS/BLUE SHIELD

## 2016-09-01 ENCOUNTER — Ambulatory Visit (HOSPITAL_COMMUNITY): Payer: BLUE CROSS/BLUE SHIELD | Admitting: Oncology

## 2016-10-19 ENCOUNTER — Encounter: Payer: Self-pay | Admitting: Internal Medicine

## 2016-10-26 IMAGING — CR DG CHEST 2V
2 series · 2 of 2 positions shown · non-contrast
Comparison: 08/05/2014

CLINICAL DATA: Right hip osteoarthritis. Pre-op respiratory exam
for right hip replacement.

EXAM:
CHEST  2 VIEW

[w chest pa]
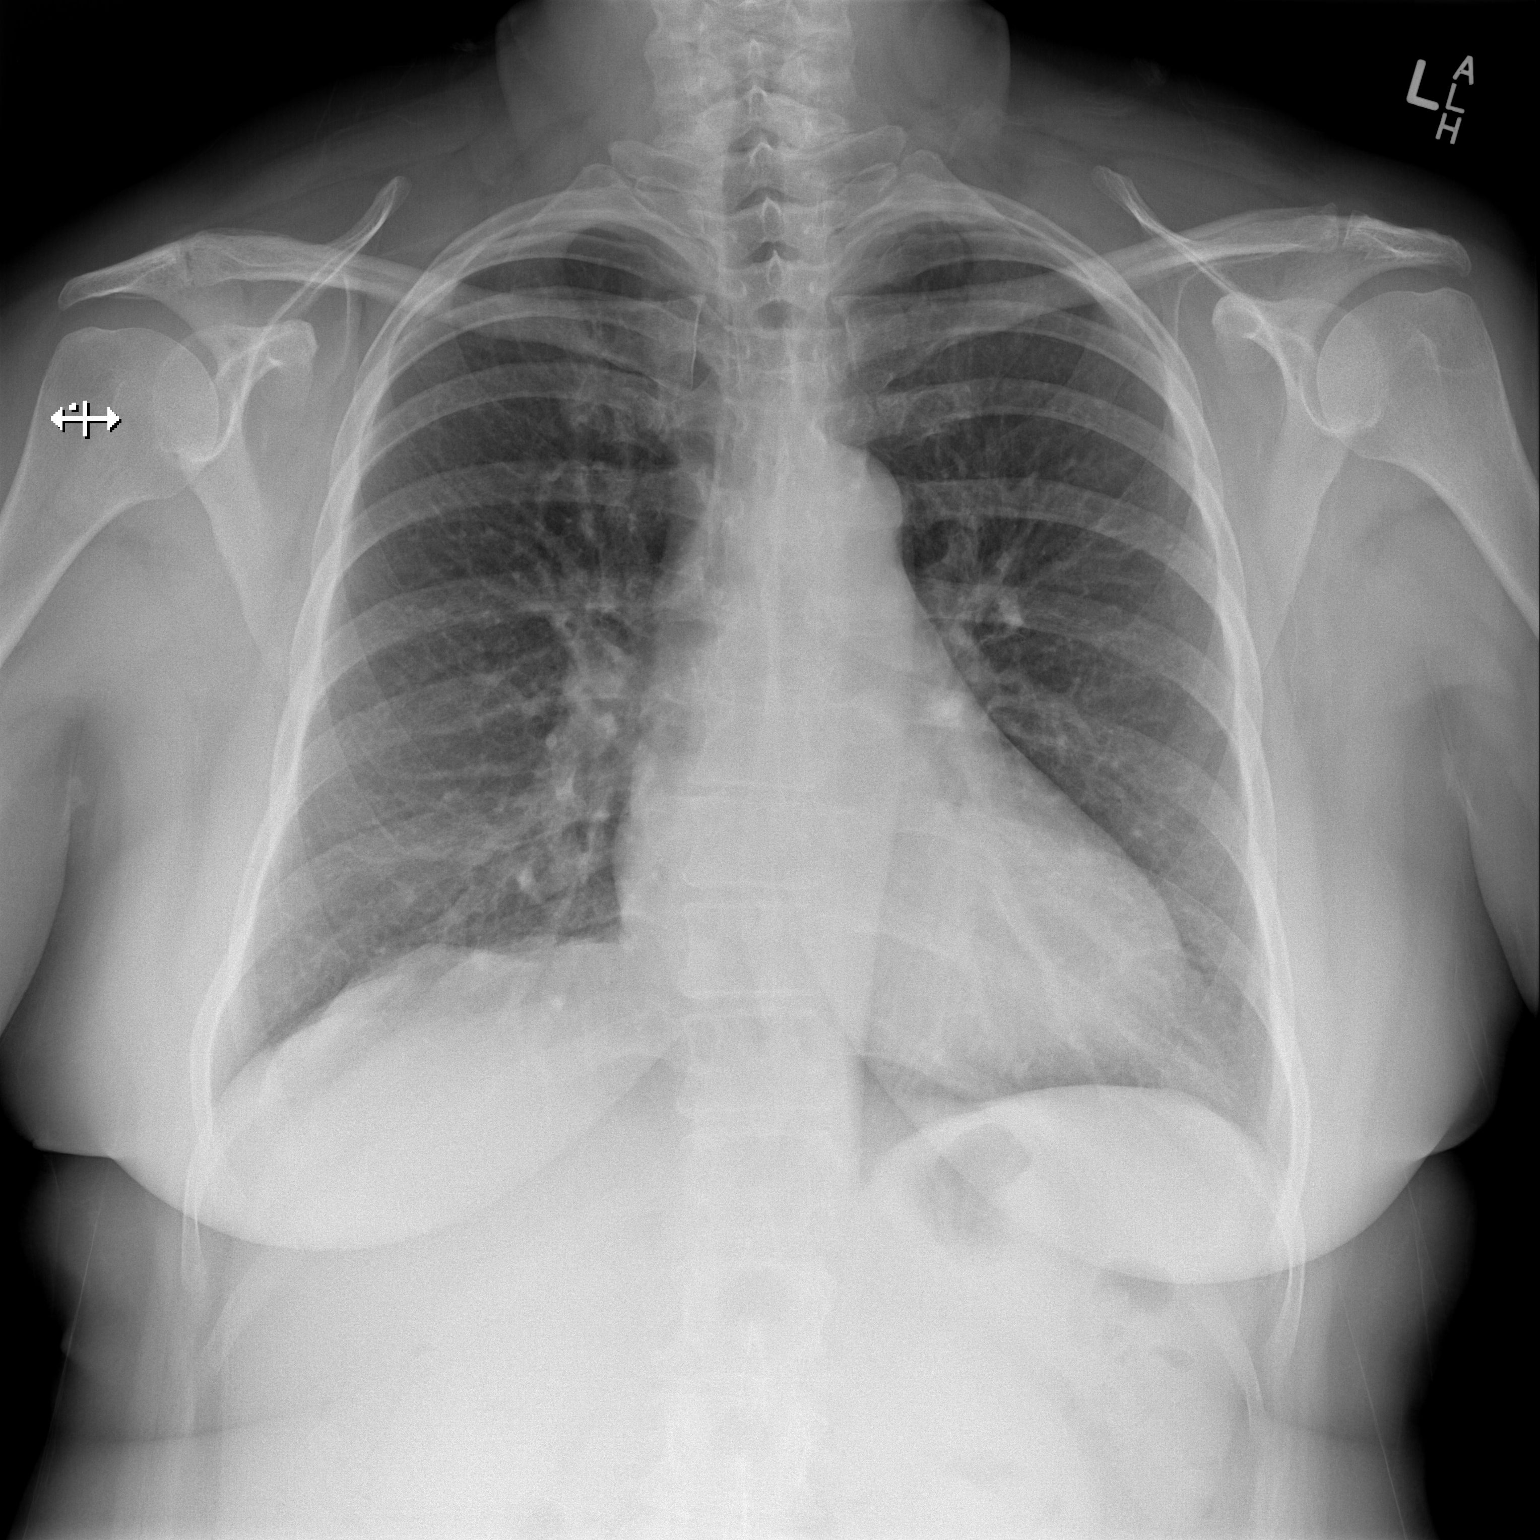

[w chest lat]
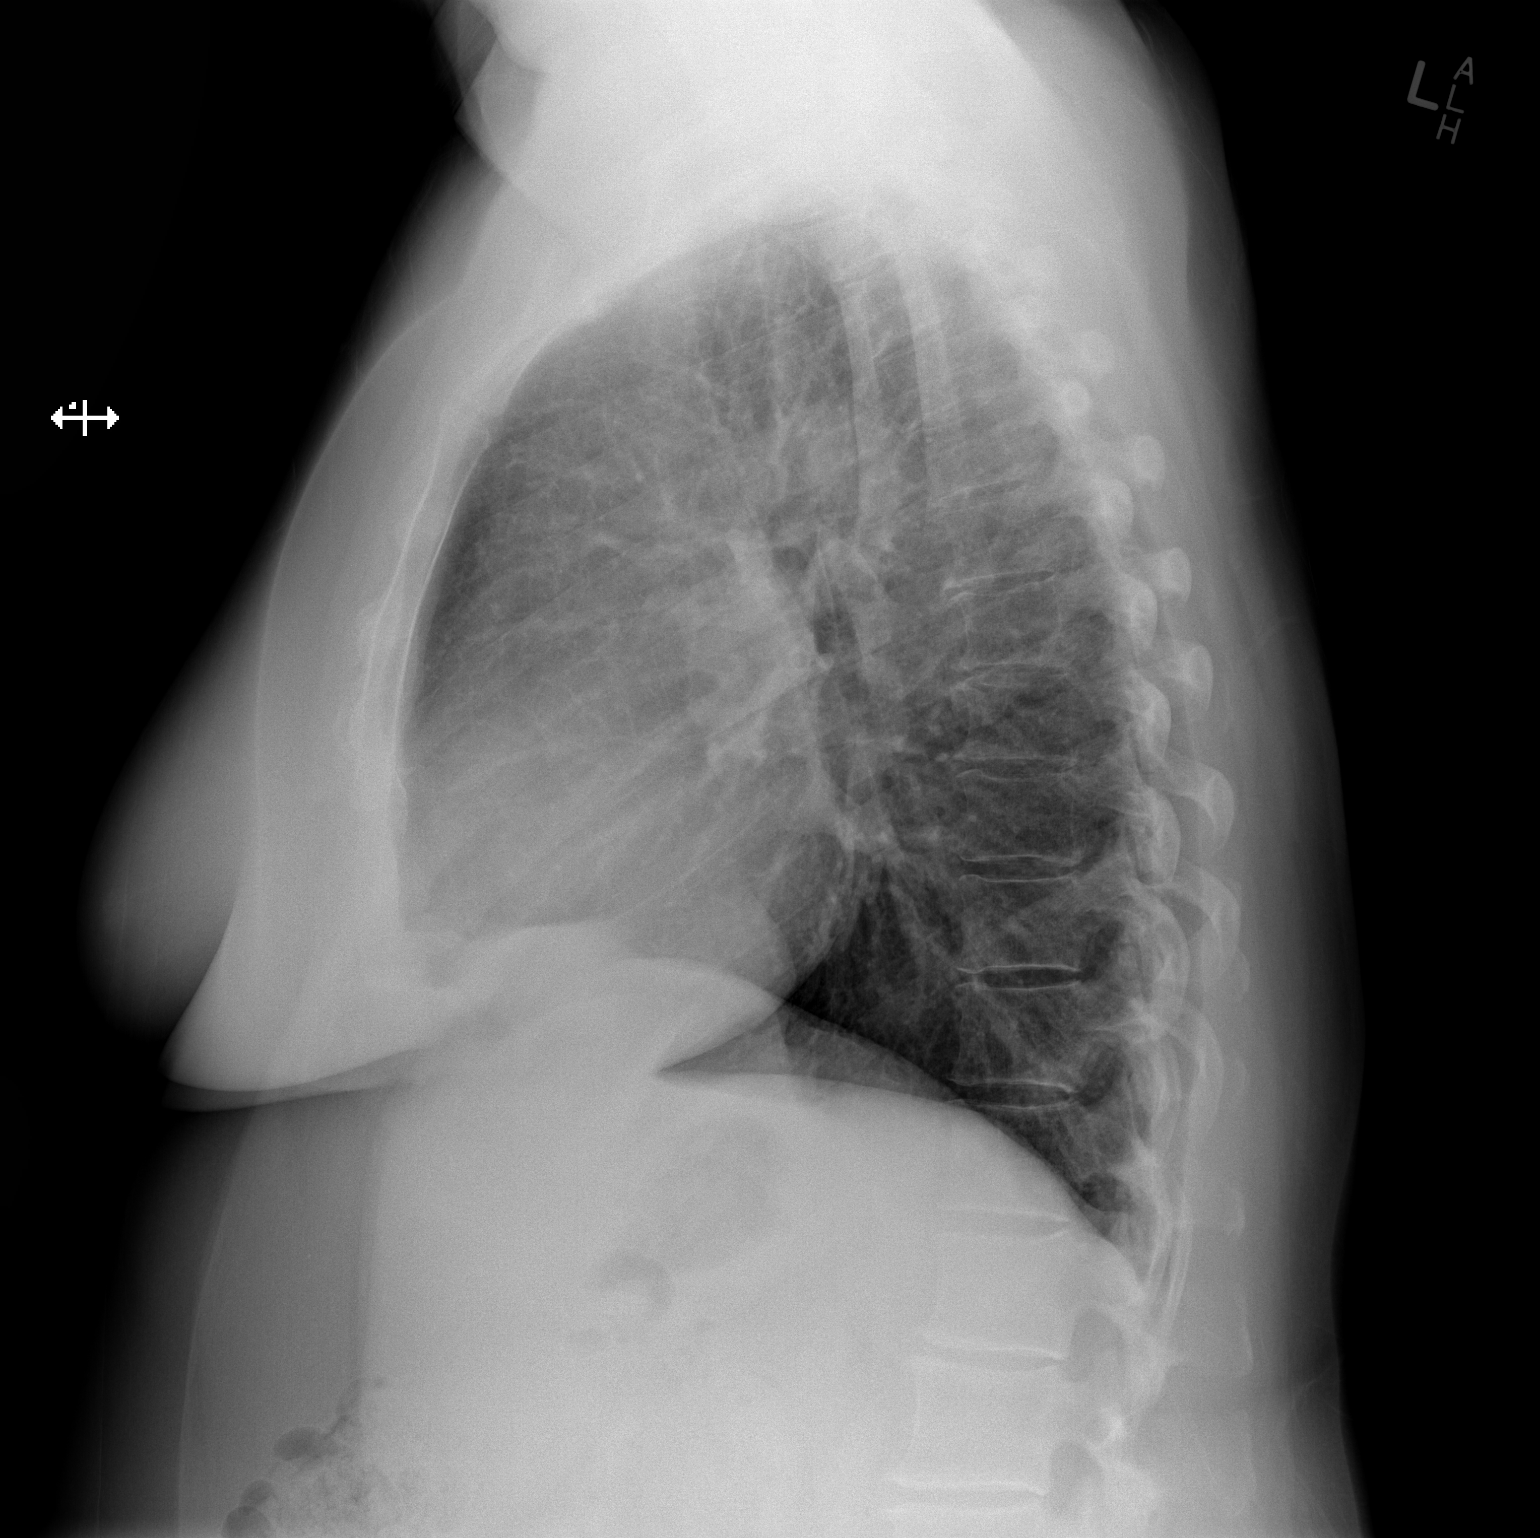

[2 of 2 positions shown; findings below may reference images not displayed]

FINDINGS: Heart size remains at the upper limits of normal. Both lungs are
clear. No evidence of pleural effusion. No mass or lymphadenopathy
identified.
IMPRESSION: Stable exam.  No active disease.

## 2016-11-22 ENCOUNTER — Encounter (HOSPITAL_COMMUNITY): Payer: BLUE CROSS/BLUE SHIELD | Attending: Oncology | Admitting: Oncology

## 2016-11-22 ENCOUNTER — Encounter (HOSPITAL_COMMUNITY): Payer: BLUE CROSS/BLUE SHIELD

## 2016-11-22 ENCOUNTER — Encounter (HOSPITAL_COMMUNITY): Payer: Self-pay | Admitting: Oncology

## 2016-11-22 VITALS — BP 119/63 | HR 69 | Temp 98.0°F | Resp 18 | Ht 65.0 in | Wt 220.0 lb

## 2016-11-22 DIAGNOSIS — D509 Iron deficiency anemia, unspecified: Secondary | ICD-10-CM | POA: Diagnosis not present

## 2016-11-22 LAB — CBC WITH DIFFERENTIAL/PLATELET
BASOS ABS: 0 10*3/uL (ref 0.0–0.1)
Basophils Relative: 1 %
EOS PCT: 2 %
Eosinophils Absolute: 0.1 10*3/uL (ref 0.0–0.7)
HEMATOCRIT: 35.2 % — AB (ref 36.0–46.0)
HEMOGLOBIN: 11.7 g/dL — AB (ref 12.0–15.0)
LYMPHS ABS: 2.9 10*3/uL (ref 0.7–4.0)
LYMPHS PCT: 53 %
MCH: 28.8 pg (ref 26.0–34.0)
MCHC: 33.2 g/dL (ref 30.0–36.0)
MCV: 86.7 fL (ref 78.0–100.0)
Monocytes Absolute: 0.5 10*3/uL (ref 0.1–1.0)
Monocytes Relative: 8 %
NEUTROS ABS: 2 10*3/uL (ref 1.7–7.7)
NEUTROS PCT: 36 %
Platelets: 304 10*3/uL (ref 150–400)
RBC: 4.06 MIL/uL (ref 3.87–5.11)
RDW: 14.3 % (ref 11.5–15.5)
WBC: 5.5 10*3/uL (ref 4.0–10.5)

## 2016-11-22 LAB — FERRITIN: Ferritin: 107 ng/mL (ref 11–307)

## 2016-11-22 NOTE — Assessment & Plan Note (Addendum)
Iron deficiency anemia with intolerance to oral iron (including Rx strength PO iron), S/P EGD by Dr. Gala Romney on 09/16/2014 demonstrating a single duodenal erosion of doubtful clinical significance, S/P Maloney dilation of the esophagus and colonoscopy on 12/08/2011 by Dr. Gala Romney showing internal hemorrhoids, diverticulosis of sigmoid colon, and colon polyp.  S/P Total vaginal hysterectomy, TVT sling (Exact), Cystoscopy on 02/02/2016 by Dr. Azucena Fallen.    Labs today: CBC diff, ferritin. I personally reviewed and went over laboratory results with the patient.  The results are noted within this dictation.  CBC demonstrates a minimal anemia.  Ferritin is pending at this time.  I suspect she is iron deficient.  We will call her with her ferritin results.  If iron deficient, she will return for IV feraheme.  Labs every 3 months: CBC diff, iron/TIBC, ferritin.  She is currently on Orencia for possible RA.  This is being managed by Dr, Trudie Reed (Rheumatology).  She is also taking Vimovo for her pain.  She is supplementing with Tylenol as well, staying well below 3500 mg daily.  Problem list reviewed with patient and edited accordingly.  Medications are reviewed with the patient and edited accordingly.  Return in 6 months for follow-up.  More than 50% of the time spent with the patient was utilized for counseling and coordination of care.  ADDENDUM: Ferritin is > 100, but given her arthritic issues, this may be falsely elevated, particularly given her mild anemia.  Therefore, I will get her set-up for 1 dose of IV feraheme.  Supportive therapy plan is built accordingly.

## 2016-11-22 NOTE — Progress Notes (Addendum)
Kathy Mariscal, MD [place] [address]  Iron deficiency anemia, unspecified iron deficiency anemia type - Plan: CBC with Differential, Iron and TIBC, Ferritin, CBC with Differential, Iron and TIBC, Ferritin  CURRENT THERAPY: IV iron replacement when indicated.  INTERVAL HISTORY: Kathy Dawson 61 y.o. female returns for followup of iron deficiency anemia with intolerance to oral iron (including Rx strength PO iron), S/P EGD by Dr. Gala Romney on 09/16/2014 demonstrating a single duodenal erosion of doubtful clinical significance, S/P Maloney dilation of the esophagus and colonoscopy on 12/08/2011 by Dr. Gala Romney showing internal hemorrhoids, diverticulosis of sigmoid colon, and colon polyp.  She notes gross hematuria that has been worked up by her Gyn S/P hysterectomy.  She reports that this was treated in December with resolution.  It has now recurred.   She will get in touch with her gynecologist.  She denies any blood in her stools or black stools.  She is doing well.  She is being followed by rheumatology.  She was recently started on Orencia injections weekly.  She has only had 2 of these thus far.  She notes that she does not have rheumatoid arthritis but they are treating her as though she has this in an attempt to help with her severe joint pain.  She is followed by Dr. Trudie Reed.  Review of Systems  Constitutional: Negative.  Negative for chills, fever and weight loss.  HENT: Negative.   Eyes: Negative.   Respiratory: Negative.  Negative for cough.   Cardiovascular: Negative.  Negative for chest pain.  Gastrointestinal: Negative.  Negative for blood in stool, constipation, diarrhea, melena, nausea and vomiting.  Genitourinary: Positive for hematuria (follow-up with Gyn upcoming).  Musculoskeletal: Negative.   Skin: Negative.   Neurological: Negative.  Negative for weakness.  Endo/Heme/Allergies: Negative.  Does not bruise/bleed easily.  Psychiatric/Behavioral:  Negative.     Past Medical History:  Diagnosis Date  . Anemia    Hx  . Anemia, iron deficiency 06/09/2013  . Arthritis    knees, hips, elbow  . Complication of anesthesia 14 yrs. ago   woke up during the appendectomy  . GERD (gastroesophageal reflux disease)    otc med prn  . Knee pain     Past Surgical History:  Procedure Laterality Date  . ANTERIOR HIP REVISION Right 11/18/2014   Procedure: Right Total Hip Arthroplasty Femoral Revision-Anteior approach;  Surgeon: Marybelle Killings, MD;  Location: [facility];  Service: Orthopedics;  Laterality: Right;  . APPENDECTOMY    . bilateral foot surgery     hammer toes  . BLADDER SUSPENSION N/A 02/02/2016   Procedure: TRANSVAGINAL TAPE (TVT) Mid-Urethral Sling PROCEDURE;  Surgeon: Azucena Fallen, MD;  Location: [facility];  Service: Gynecology;  Laterality: N/A;  . COLONOSCOPY  12/08/2011   EYC:[id number] hemorrhoids/Sigmoid diverticulosis. Colonic polyp (tubular adenoma). Surveillance 2018  . CYSTOSCOPY N/A 02/02/2016   Procedure: CYSTOSCOPY;  Surgeon: Azucena Fallen, MD;  Location: [facility];  Service: Gynecology;  Laterality: N/A;  . ESOPHAGOGASTRODUODENOSCOPY N/A 09/16/2014   Procedure: ESOPHAGOGASTRODUODENOSCOPY (EGD);  Surgeon: Daneil Dolin, MD;  [facility];  Service: Endoscopy;  Laterality: N/A;  1030am  . KNEE SURGERY Right 2011   arthroscopy  . MALONEY DILATION N/A 09/16/2014   Procedure: Venia Minks DILATION;  Surgeon: Daneil Dolin, MD;  [facility];  Service: Endoscopy;  Laterality: N/A;  . 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 TOTAL HIP ARTHROPLASTY Right 11/04/2014   Procedure: TOTAL  HIP ARTHROPLASTY ANTERIOR APPROACH, Possible Local Bonegrafting Acetabular Cyst;  Surgeon: Marybelle Killings, MD;  Location: [facility];  Service: Orthopedics;  Laterality: Right;  . TUBAL LIGATION    . VAGINAL HYSTERECTOMY N/A 02/02/2016   Procedure: HYSTERECTOMY VAGINAL;  Surgeon: Azucena Fallen, MD;  Location: [facility];  Service: Gynecology;  Laterality:  N/A;    Family History  Problem Relation Age of Onset  . Colon polyps Father   . Cancer Father   . Heart disease Father   . Heart disease Other   . Colon cancer Neg Hx     Social History   Social History  . Marital status: Married    Spouse name: N/A  . Number of children: N/A  . Years of education: N/A   Occupational History  . tobacco farmer    Social History Main Topics  . Smoking status: Former Smoker    Packs/day: 0.50    Years: 10.00    Types: Cigarettes  . Smokeless tobacco: Never Used  . Alcohol use No  . Drug use: No  . Sexual activity: Not Currently    Birth control/ protection: None   Other Topics Concern  . None   Social History Narrative  . None     PHYSICAL EXAMINATION  ECOG PERFORMANCE STATUS: 1 - Symptomatic but completely ambulatory  Vitals:   11/22/16 1523  BP: 119/63  Pulse: 69  Resp: 18  Temp: 98 F (36.7 C)    GENERAL:alert, no distress, well nourished, well developed, comfortable, cooperative, obese, smiling and unaccompanied SKIN: skin color, texture, turgor are normal, no rashes or significant lesions HEAD: Normocephalic, No masses, lesions, tenderness or abnormalities EYES: normal, EOMI, Conjunctiva are pink and non-injected EARS: External ears normal OROPHARYNX:lips, buccal mucosa, and tongue normal and mucous membranes are moist  NECK: supple, no adenopathy, trachea midline LYMPH:  no palpable lymphadenopathy BREAST:not examined LUNGS: clear to auscultation without wheezes, rales, or rhonchi. HEART: regular rate & rhythm withour murmur, rub, or gallop.  Normal S1 and S2. ABDOMEN:abdomen soft, non-tender, obese and normal bowel sounds BACK: Back symmetric, no curvature. EXTREMITIES:less then 2 second capillary refill, no joint deformities, effusion, or inflammation, no skin discoloration, no cyanosis  NEURO: alert & oriented x 3 with fluent speech, no focal motor/sensory deficits.   LABORATORY DATA: CBC    Component  Value Date/Time   WBC 5.5 11/22/2016 1401   RBC 4.06 11/22/2016 1401   HGB 11.7 (L) 11/22/2016 1401   HCT 35.2 (L) 11/22/2016 1401   PLT 304 11/22/2016 1401   MCV 86.7 11/22/2016 1401   MCH 28.8 11/22/2016 1401   MCHC 33.2 11/22/2016 1401   RDW 14.3 11/22/2016 1401   LYMPHSABS 2.9 11/22/2016 1401   MONOABS 0.5 11/22/2016 1401   EOSABS 0.1 11/22/2016 1401   BASOSABS 0.0 11/22/2016 1401      Chemistry      Component Value Date/Time   NA 140 07/04/2016 1432   K 3.3 (L) 07/04/2016 1432   CL 109 07/04/2016 1432   CO2 23 07/04/2016 1432   BUN 15 07/04/2016 1432   CREATININE 0.67 07/04/2016 1432      Component Value Date/Time   CALCIUM 8.8 (L) 07/04/2016 1432   ALKPHOS 79 07/04/2016 1432   AST 46 (H) 07/04/2016 1432   ALT 73 (H) 07/04/2016 1432   BILITOT 0.8 07/04/2016 1432     Lab Results  Component Value Date   IRON 15 (L) 06/04/2013   TIBC 400 06/04/2013   FERRITIN 107 11/22/2016  PENDING LABS:   RADIOGRAPHIC STUDIES:  No results found.   PATHOLOGY:    ASSESSMENT AND PLAN:  Anemia, iron deficiency Iron deficiency anemia with intolerance to oral iron (including Rx strength PO iron), S/P EGD by Dr. Gala Romney on 09/16/2014 demonstrating a single duodenal erosion of doubtful clinical significance, S/P Maloney dilation of the esophagus and colonoscopy on 12/08/2011 by Dr. Gala Romney showing internal hemorrhoids, diverticulosis of sigmoid colon, and colon polyp.  S/P Total vaginal hysterectomy, TVT sling (Exact), Cystoscopy on 02/02/2016 by Dr. Azucena Fallen.    Labs today: CBC diff, ferritin. I personally reviewed and went over laboratory results with the patient.  The results are noted within this dictation.  CBC demonstrates a minimal anemia.  Ferritin is pending at this time.  I suspect she is iron deficient.  We will call her with her ferritin results.  If iron deficient, she will return for IV feraheme.  Labs every 3 months: CBC diff, iron/TIBC, ferritin.  She is  currently on Orencia for possible RA.  This is being managed by Dr, Trudie Reed (Rheumatology).  She is also taking Vimovo for her pain.  She is supplementing with Tylenol as well, staying well below 3500 mg daily.  Problem list reviewed with patient and edited accordingly.  Medications are reviewed with the patient and edited accordingly.  Return in 6 months for follow-up.  More than 50% of the time spent with the patient was utilized for counseling and coordination of care.  ADDENDUM: Ferritin is > 100, but given her arthritic issues, this may be falsely elevated, particularly given her mild anemia.  Therefore, I will get her set-up for 1 dose of IV feraheme.  Supportive therapy plan is built accordingly.   ORDERS PLACED FOR THIS ENCOUNTER: Orders Placed This Encounter  Procedures  . CBC with Differential  . Iron and TIBC  . Ferritin  . CBC with Differential  . Iron and TIBC  . Ferritin    MEDICATIONS PRESCRIBED THIS ENCOUNTER: Meds ordered this encounter  Medications  . VIMOVO 500-20 MG TBEC    Sig: Take 1 tablet by mouth 2 (two) times daily as needed.    Refill:  2  . Abatacept (ORENCIA [place])    Sig: Inject into the skin.    THERAPY PLAN:  We will continue to monitor labs, including iron studies, and provide IV iron as indicated.  All questions were answered. The patient knows to call the clinic with any problems, questions or concerns. We can certainly see the patient much sooner if necessary.  Patient and plan discussed with Dr. Twana First and she is in agreement with the aforementioned.   This note is electronically signed by: Doy Mince 11/23/2016 8:48 AM

## 2016-11-22 NOTE — Patient Instructions (Signed)
[place] at [facility] Discharge Instructions  RECOMMENDATIONS MADE BY THE CONSULTANT AND ANY TEST RESULTS WILL BE SENT TO YOUR REFERRING PHYSICIAN.  You were seen today by Kirby Crigler PA-C.  Labs every 3 months. Return in 6 months for follow up.   Thank you for choosing [place] at [facility] to provide your oncology and hematology care.  To afford each patient quality time with our provider, please arrive at least 15 minutes before your scheduled appointment time.    If you have a lab appointment with the [facility] please come in thru the  Main Entrance and check in at the main information desk  You need to re-schedule your appointment should you arrive 10 or more minutes late.  We strive to give you quality time with our providers, and arriving late affects you and other patients whose appointments are after yours.  Also, if you no show three or more times for appointments you may be dismissed from the clinic at the providers discretion.     Again, thank you for choosing [facility].  Our hope is that these requests will decrease the amount of time that you wait before being seen by our physicians.       _____________________________________________________________  Should you have questions after your visit to [facility], please contact our office at [phone] between the hours of 8:30 a.m. and 4:30 p.m.  Voicemails left after 4:30 p.m. will not be returned until the following business day.  For prescription refill requests, have your pharmacy contact our office.       Resources For Cancer Patients and their Caregivers ? American Cancer Society: Can assist with transportation, wigs, general needs, runs Look Good Feel Better.        3073339496 ? Cancer Care: Provides financial assistance, online support groups, medication/co-pay assistance.  1-800-813-HOPE 5154597979) ? [place] Assists [place] cancer patients and their families through emotional , educational and financial support.  762-715-9946 ? [place] DSS Where to apply for food stamps, Medicaid and utility assistance. 321-709-3753 ? [employer]: Transportation to medical appointments. 720-654-0288 ? Social Security Administration: May apply for disability if have a Stage IV cancer. 563-589-8604 819-407-2658 ? [employer], Disability and Transit Services: Assists with nutrition, care and transit needs. [place] Support Programs: @10RELATIVEDAYS @ > Cancer Support Group  2nd Tuesday of the month 1pm-2pm, Journey Room  > Creative Journey  3rd Tuesday of the month 1130am-1pm, Journey Room  > Look Good Feel Better  1st Wednesday of the month 10am-12 noon, Journey Room (Call [place] to register [phone])

## 2016-11-23 NOTE — Addendum Note (Signed)
Addended by: Baird Cancer on: 11/23/2016 08:49 AM   Modules accepted: Orders

## 2017-02-21 ENCOUNTER — Other Ambulatory Visit (HOSPITAL_COMMUNITY): Payer: BLUE CROSS/BLUE SHIELD

## 2017-05-25 ENCOUNTER — Ambulatory Visit (HOSPITAL_COMMUNITY): Payer: BLUE CROSS/BLUE SHIELD | Admitting: Adult Health

## 2017-05-25 ENCOUNTER — Ambulatory Visit (HOSPITAL_COMMUNITY): Payer: BLUE CROSS/BLUE SHIELD

## 2017-05-25 ENCOUNTER — Other Ambulatory Visit (HOSPITAL_COMMUNITY): Payer: BLUE CROSS/BLUE SHIELD

## 2017-06-10 ENCOUNTER — Other Ambulatory Visit (HOSPITAL_COMMUNITY): Payer: Self-pay | Admitting: Adult Health

## 2017-06-10 ENCOUNTER — Encounter (HOSPITAL_COMMUNITY): Payer: BLUE CROSS/BLUE SHIELD | Attending: Oncology

## 2017-06-10 ENCOUNTER — Encounter (HOSPITAL_COMMUNITY): Payer: Self-pay | Admitting: Oncology

## 2017-06-10 ENCOUNTER — Encounter (HOSPITAL_BASED_OUTPATIENT_CLINIC_OR_DEPARTMENT_OTHER): Payer: BLUE CROSS/BLUE SHIELD | Admitting: Oncology

## 2017-06-10 VITALS — BP 129/75 | HR 74 | Temp 98.0°F | Resp 18 | Wt 210.6 lb

## 2017-06-10 DIAGNOSIS — K219 Gastro-esophageal reflux disease without esophagitis: Secondary | ICD-10-CM | POA: Insufficient documentation

## 2017-06-10 DIAGNOSIS — Z8371 Family history of colonic polyps: Secondary | ICD-10-CM | POA: Diagnosis not present

## 2017-06-10 DIAGNOSIS — R51 Headache: Secondary | ICD-10-CM | POA: Diagnosis not present

## 2017-06-10 DIAGNOSIS — D509 Iron deficiency anemia, unspecified: Secondary | ICD-10-CM

## 2017-06-10 DIAGNOSIS — Z86010 Personal history of colonic polyps: Secondary | ICD-10-CM | POA: Insufficient documentation

## 2017-06-10 DIAGNOSIS — Z90710 Acquired absence of both cervix and uterus: Secondary | ICD-10-CM | POA: Diagnosis not present

## 2017-06-10 DIAGNOSIS — Z8249 Family history of ischemic heart disease and other diseases of the circulatory system: Secondary | ICD-10-CM | POA: Insufficient documentation

## 2017-06-10 DIAGNOSIS — Z96641 Presence of right artificial hip joint: Secondary | ICD-10-CM | POA: Insufficient documentation

## 2017-06-10 DIAGNOSIS — Z96651 Presence of right artificial knee joint: Secondary | ICD-10-CM | POA: Insufficient documentation

## 2017-06-10 DIAGNOSIS — Z87891 Personal history of nicotine dependence: Secondary | ICD-10-CM | POA: Diagnosis not present

## 2017-06-10 LAB — CBC WITH DIFFERENTIAL/PLATELET
BASOS PCT: 0 %
Basophils Absolute: 0 10*3/uL (ref 0.0–0.1)
EOS ABS: 0.2 10*3/uL (ref 0.0–0.7)
Eosinophils Relative: 2 %
HCT: 38.6 % (ref 36.0–46.0)
HEMOGLOBIN: 12.5 g/dL (ref 12.0–15.0)
LYMPHS ABS: 3.1 10*3/uL (ref 0.7–4.0)
Lymphocytes Relative: 45 %
MCH: 28.1 pg (ref 26.0–34.0)
MCHC: 32.4 g/dL (ref 30.0–36.0)
MCV: 86.7 fL (ref 78.0–100.0)
Monocytes Absolute: 0.5 10*3/uL (ref 0.1–1.0)
Monocytes Relative: 8 %
NEUTROS PCT: 45 %
Neutro Abs: 3.2 10*3/uL (ref 1.7–7.7)
Platelets: 308 10*3/uL (ref 150–400)
RBC: 4.45 MIL/uL (ref 3.87–5.11)
RDW: 13.9 % (ref 11.5–15.5)
WBC: 7 10*3/uL (ref 4.0–10.5)

## 2017-06-10 LAB — IRON AND TIBC
IRON: 32 ug/dL (ref 28–170)
Saturation Ratios: 10 % — ABNORMAL LOW (ref 10.4–31.8)
TIBC: 335 ug/dL (ref 250–450)
UIBC: 303 ug/dL

## 2017-06-10 LAB — FERRITIN: FERRITIN: 59 ng/mL (ref 11–307)

## 2017-06-10 NOTE — Patient Instructions (Signed)
[place] at [facility] Discharge Instructions  RECOMMENDATIONS MADE BY THE CONSULTANT AND ANY TEST RESULTS WILL BE SENT TO YOUR REFERRING PHYSICIAN.  You were seen today by Dr. Forest Gleason You will need to follow up in 1 year  Thank you for choosing [place] at [facility] to provide your oncology and hematology care.  To afford each patient quality time with our provider, please arrive at least 15 minutes before your scheduled appointment time.    If you have a lab appointment with the [facility] please come in thru the  Main Entrance and check in at the main information desk  You need to re-schedule your appointment should you arrive 10 or more minutes late.  We strive to give you quality time with our providers, and arriving late affects you and other patients whose appointments are after yours.  Also, if you no show three or more times for appointments you may be dismissed from the clinic at the providers discretion.     Again, thank you for choosing [facility].  Our hope is that these requests will decrease the amount of time that you wait before being seen by our physicians.       _____________________________________________________________  Should you have questions after your visit to [facility], please contact our office at [phone] between the hours of 8:30 a.m. and 4:30 p.m.  Voicemails left after 4:30 p.m. will not be returned until the following business day.  For prescription refill requests, have your pharmacy contact our office.       Resources For Cancer Patients and their Caregivers ? American Cancer Society: Can assist with transportation, wigs, general needs, runs Look Good Feel Better.        620-185-4001 ? Cancer Care: Provides financial assistance, online support groups, medication/co-pay assistance.  1-800-813-HOPE (862)181-8168) ? [place] Assists  [place] cancer patients and their families through emotional , educational and financial support.  6405120926 ? [place] DSS Where to apply for food stamps, Medicaid and utility assistance. 343-318-0970 ? [employer]: Transportation to medical appointments. 540-738-1081 ? Social Security Administration: May apply for disability if have a Stage IV cancer. 351-702-1512 (902)436-6231 ? [employer], Disability and Transit Services: Assists with nutrition, care and transit needs. [place] Support Programs: @10RELATIVEDAYS @ > Cancer Support Group  2nd Tuesday of the month 1pm-2pm, Journey Room  > Creative Journey  3rd Tuesday of the month 1130am-1pm, Journey Room  > Look Good Feel Better  1st Wednesday of the month 10am-12 noon, Journey Room (Call [place] to register [phone])

## 2017-06-10 NOTE — Progress Notes (Signed)
Kathy Dawson, Kathy Dawson [id number]  No diagnosis found.  CURRENT THERAPY: IV iron replacement when indicated.  INTERVAL HISTORY: Kathy Dawson 60 y.o. female returns for followup of iron deficiency anemia with intolerance to oral iron (including Rx strength PO iron), S/P EGD by Dr. Gala Romney on 09/16/2014 demonstrating a single duodenal erosion of doubtful clinical significance, S/P Maloney dilation of the esophagus and colonoscopy on 12/08/2011 by Dr. Gala Romney showing internal hemorrhoids, diverticulosis of sigmoid colon, and colon polyp.  She notes an increase in headache frequency, which she reports is common when her iron is low.  She denies any vaginal bleeding, blood in stool, and black tarry stool.  She denies any known signs of blood loss.  She denies any complaints.  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 October 26, 2018oCTOBER 26, 2018  Patient is here for further follow-up regarding iron deficiency anemia No major complaints.o major complaints.patient did not get any colonoscopy done as he was supposed to call her gastroenterologist o  Patient did not get any colonoscopy done she was supposed to call her gaenterologist and schedule for colonoscopy  Last colonoscopy was 5 years ago. No GI bleeding.  Patient does not take any aspirin but occasionally takes Advil .advised to decrease Advil intake Return appointment in 1 year   Review of Systems  Constitutional: Negative for chills, fever and weight loss.  HENT: Negative for nosebleeds.   Eyes: Negative.   Respiratory: Negative.  Negative for hemoptysis.   Cardiovascular: Negative.   Gastrointestinal: Negative.  Negative for blood in stool and melena.  Genitourinary: Negative.  Negative for hematuria.  Musculoskeletal: Negative.   Skin: Negative.   Neurological: Positive for headaches.  Endo/Heme/Allergies: Negative.  Does not bruise/bleed easily.    Psychiatric/Behavioral: Negative.     Past Medical History:  Diagnosis Date  . Anemia    Hx  . Anemia, iron deficiency 06/09/2013  . Arthritis    knees, hips, elbow  . Complication of anesthesia 14 yrs. ago   woke up during the appendectomy  . GERD (gastroesophageal reflux disease)    otc med prn  . Knee pain     Past Surgical History:  Procedure Laterality Date  . ANTERIOR HIP REVISION Right 11/18/2014   Procedure: Right Total Hip Arthroplasty Femoral Revision-Anteior approach;  Surgeon: Marybelle Killings, MD;  Location: [facility];  Service: Orthopedics;  Laterality: Right;  . APPENDECTOMY    . bilateral foot surgery     hammer toes  . BLADDER SUSPENSION N/A 02/02/2016   Procedure: TRANSVAGINAL TAPE (TVT) Mid-Urethral Sling PROCEDURE;  Surgeon: Azucena Fallen, MD;  Location: [facility];  Service: Gynecology;  Laterality: N/A;  . COLONOSCOPY  12/08/2011   GBT:DVVOHYWV hemorrhoids/Sigmoid diverticulosis. Colonic polyp (tubular adenoma). Surveillance 2018  . CYSTOSCOPY N/A 02/02/2016   Procedure: CYSTOSCOPY;  Surgeon: Azucena Fallen, MD;  Location: [facility];  Service: Gynecology;  Laterality: N/A;  . ESOPHAGOGASTRODUODENOSCOPY N/A 09/16/2014   Procedure: ESOPHAGOGASTRODUODENOSCOPY (EGD);  Surgeon: Daneil Dolin, MD;  [facility];  Service: Endoscopy;  Laterality: N/A;  1030am  . KNEE SURGERY Right 2011   arthroscopy  . MALONEY DILATION N/A 09/16/2014   Procedure: Venia Minks DILATION;  Surgeon: Daneil Dolin, MD;  [facility];  Service: Endoscopy;  Laterality: N/A;  . 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 TOTAL HIP ARTHROPLASTY Right 11/04/2014   Procedure: TOTAL HIP ARTHROPLASTY ANTERIOR APPROACH, Possible Local Bonegrafting Acetabular  Cyst;  Surgeon: Marybelle Killings, MD;  Location: [facility];  Service: Orthopedics;  Laterality: Right;  . TUBAL LIGATION    . VAGINAL HYSTERECTOMY N/A 02/02/2016   Procedure: HYSTERECTOMY VAGINAL;  Surgeon: Azucena Fallen, MD;  Location: [facility];  Service:  Gynecology;  Laterality: N/A;    Family History  Problem Relation Age of Onset  . Colon polyps Father   . Cancer Father   . Heart disease Father   . Heart disease Other   . Colon cancer Neg Hx     Social History   Social History  . Marital status: Married    Spouse name: N/A  . Number of children: N/A  . Years of education: N/A   Occupational History  . tobacco farmer    Social History Main Topics  . Smoking status: Former Smoker    Packs/day: 0.50    Years: 10.00    Types: Cigarettes  . Smokeless tobacco: Never Used  . Alcohol use No  . Drug use: No  . Sexual activity: Not Currently    Birth control/ protection: None   Other Topics Concern  . Not on file   Social History Narrative  . No narrative on file     PHYSICAL EXAMINATION  ECOG PERFORMANCE STATUS: 1 - Symptomatic but completely ambulatory  There were no vitals filed for this visit.  GENERAL:alert, no distress, well nourished, well developed, comfortable, cooperative, obese, smiling and unaccompanied SKIN: skin color, texture, turgor are normal, no rashes or significant lesions HEAD: Normocephalic, No masses, lesions, tenderness or abnormalities EYES: normal, EOMI, Conjunctiva are pink and non-injected EARS: External ears normal OROPHARYNX:lips, buccal mucosa, and tongue normal and mucous membranes are moist  NECK: supple, no adenopathy, trachea midline LYMPH:  no palpable lymphadenopathy BREAST:not examined LUNGS: clear to auscultation  HEART: regular rate & rhythm ABDOMEN:abdomen soft, non-tender, obese and normal bowel sounds BACK: Back symmetric, no curvature. EXTREMITIES:less then 2 second capillary refill, no joint deformities, effusion, or inflammation, no skin discoloration, no cyanosis  NEURO: alert & oriented x 3 with fluent speech, no focal motor/sensory deficits, gait normal   LABORATORY DATA: CBC    Component Value Date/Time   WBC 7.0 06/10/2017 0928   RBC 4.45 06/10/2017 0928     HGB 12.5 06/10/2017 0928   HCT 38.6 06/10/2017 0928   PLT 308 06/10/2017 0928   MCV 86.7 06/10/2017 0928   MCH 28.1 06/10/2017 0928   MCHC 32.4 06/10/2017 0928   RDW 13.9 06/10/2017 0928   LYMPHSABS 3.1 06/10/2017 0928   MONOABS 0.5 06/10/2017 0928   EOSABS 0.2 06/10/2017 0928   BASOSABS 0.0 06/10/2017 0928      Chemistry      Component Value Date/Time   NA 140 07/04/2016 1432   K 3.3 (L) 07/04/2016 1432   CL 109 07/04/2016 1432   CO2 23 07/04/2016 1432   BUN 15 07/04/2016 1432   CREATININE 0.67 07/04/2016 1432      Component Value Date/Time   CALCIUM 8.8 (L) 07/04/2016 1432   ALKPHOS 79 07/04/2016 1432   AST 46 (H) 07/04/2016 1432   ALT 73 (H) 07/04/2016 1432   BILITOT 0.8 07/04/2016 1432     Lab Results  Component Value Date   IRON 15 (L) 06/04/2013   TIBC 400 06/04/2013   FERRITIN 107 11/22/2016      PENDING LABS: Iron iron-binding capacity and ferritin   RADIOGRAPHIC STUDIES:  No results found.   PATHOLOGY:    ASSESSMENT AND PLAN:  Iron Deficiency anemia Stable hemoglobin Ferritin is pending Reevaluate in 1 year or before if needed Advise colonoscopy.  Patient to call her gastroenterologist and schedule colonoscopy  ORDERS PLACED FOR THIS ENCOUNTER: No orders of the defined types were placed in this encounter.   MEDICATIONS PRESCRIBED THIS ENCOUNTER: No orders of the defined types were placed in this encounter.   THERAPY PLAN:  We will continue to monitor labs, including iron studies, and provide IV iron as indicated.  All questions were answered. The patient knows to call the clinic with any problems, questions or concerns. We can certainly see the patient much sooner if necessary.  Patient and plan discussed with Dr. Ancil Linsey and she is in agreement with the aforementioned.   This note is electronically signed by: Forest Gleason, MD 06/10/2017 10:00 AM

## 2017-06-10 NOTE — Addendum Note (Signed)
Addended by: Donetta Potts on: 06/10/2017 03:08 PM   Modules accepted: Orders

## 2017-06-14 ENCOUNTER — Encounter (HOSPITAL_BASED_OUTPATIENT_CLINIC_OR_DEPARTMENT_OTHER): Payer: BLUE CROSS/BLUE SHIELD

## 2017-06-14 ENCOUNTER — Encounter (HOSPITAL_COMMUNITY): Payer: Self-pay

## 2017-06-14 VITALS — BP 128/81 | HR 70 | Temp 97.9°F | Resp 18

## 2017-06-14 DIAGNOSIS — D509 Iron deficiency anemia, unspecified: Secondary | ICD-10-CM | POA: Diagnosis not present

## 2017-06-14 MED ORDER — SODIUM CHLORIDE 0.9 % IV SOLN
510.0000 mg | Freq: Once | INTRAVENOUS | Status: AC
  Administered 2017-06-14: 510 mg via INTRAVENOUS
  Filled 2017-06-14: qty 17

## 2017-06-14 MED ORDER — SODIUM CHLORIDE 0.9 % IV SOLN
Freq: Once | INTRAVENOUS | Status: AC
  Administered 2017-06-14: 11:00:00 via INTRAVENOUS

## 2017-06-14 NOTE — Progress Notes (Signed)
Tolerated infusion w/o adverse reaction.  Alert, in no distress.  VSS.  Discharged ambulatory.  

## 2017-06-20 ENCOUNTER — Telehealth (INDEPENDENT_AMBULATORY_CARE_PROVIDER_SITE_OTHER): Payer: Self-pay | Admitting: Orthopaedic Surgery

## 2017-06-20 NOTE — Telephone Encounter (Signed)
Patient request a call with the make & model, and type of equipment used for her surgery back in 2016

## 2017-06-27 NOTE — Telephone Encounter (Signed)
I called patient and advised. She is seeing a doctor who is concerned that she could be allergic to metal.  I gave her the information on the stem and head of the implant that was used in the revision.

## 2017-08-22 ENCOUNTER — Encounter: Payer: Self-pay | Admitting: Nurse Practitioner

## 2017-08-22 ENCOUNTER — Ambulatory Visit: Payer: BLUE CROSS/BLUE SHIELD | Admitting: Nurse Practitioner

## 2017-08-22 ENCOUNTER — Other Ambulatory Visit: Payer: Self-pay

## 2017-08-22 VITALS — BP 146/87 | HR 86 | Temp 97.2°F | Ht 65.0 in | Wt 211.4 lb

## 2017-08-22 DIAGNOSIS — D126 Benign neoplasm of colon, unspecified: Secondary | ICD-10-CM

## 2017-08-22 DIAGNOSIS — K5900 Constipation, unspecified: Secondary | ICD-10-CM

## 2017-08-22 DIAGNOSIS — K219 Gastro-esophageal reflux disease without esophagitis: Secondary | ICD-10-CM | POA: Diagnosis not present

## 2017-08-22 MED ORDER — CLENPIQ 10-3.5-12 MG-GM -GM/160ML PO SOLN: 1.0000 | Freq: Once | ORAL | 0 refills | Status: AC

## 2017-08-22 NOTE — H&P (View-Only) (Signed)
Referring Provider: [name], MD Primary Care Physician:  Elenore Paddy, FNP Primary GI:  Dr. Gala Romney  Chief Complaint  Patient presents with  . Colonoscopy  . Constipation    f/u.   Marland Kitchen Gastroesophageal Reflux    c/o CP, throat burning when bends over    HPI:   Kathy Dawson is a 62 y.o. female who presents for follow-up and scheduling of 5 your colonoscopy as well as follow-up on constipation and GERD.  The patient was last seen in our office 08/29/2014 for GERD, dysphasia, constipation.  History of IDA requiring IV iron infusion with improvement in hemoglobin and ferritin.  Noted severe indigestion, early satiety, upper abdominal pressure.  Occasional solid food dysphasia.  Taking Prilosec over-the-counter.  No GI bleeding noted.  Also with chronic constipation and Linzess caused diarrhea.  Tried supplemental fiber without improvement.  Colonoscopy up-to-date, start Amitiza 8 mcg twice a day with samples provided, EGD with possible dilation, change to prescription Protonix.  EGD completed 09/16/2014 which found a single duodenal erosion of doubtful clinical significance, status post Maloney dilation, status post gastric and duodenal biopsy.  Recommended Protonix 40 mg daily.  Continue Amitiza.  Vertical pathology found the duodenal biopsy to be benign mucosa.  Gastric biopsy positive for H. pylori.  She was treated with PrevPak.  Last colonoscopy completed 12/08/2011 which found internal hemorrhoids as likely source of hematochezia, sigmoid diverticulosis, colon polyp status post removal, colonic lipoma.  Recommended Anusol and constipation treatment.  Surgical pathology found the polyp to be tubular adenoma.  Recommended 5-year repeat exam.  Today she states she only takes Prilosec prn. Takes Ibuprofen daily. Has intermittent GERD symptoms. Explained need to take PPI daily. Also uses Alka Seltzer heartburn frequently. Has GERD symptoms about several times a week. Feels a large part of her  symptoms are diet-related. Continued constipation, not currently on anything for constipation other than intermittent OTC laxatives. No longer on Amitiza. Previously failed fiber, MiraLAX, Linzess. States she doesn't like taking daily medications. Denies any other abdominal pain, N/V, fever, chills, unintentional weight loss, hematochezia, melena. Denies chest pain, dyspnea, dizziness, lightheadedness, syncope, near syncope. Denies any other upper or lower GI symptoms.  Past Medical History:  Diagnosis Date  . Anemia    Hx  . Anemia, iron deficiency 06/09/2013  . Arthritis    knees, hips, elbow  . Complication of anesthesia 14 yrs. ago   woke up during the appendectomy  . GERD (gastroesophageal reflux disease)    otc med prn  . Knee pain     Past Surgical History:  Procedure Laterality Date  . ANTERIOR HIP REVISION Right 11/18/2014   Procedure: Right Total Hip Arthroplasty Femoral Revision-Anteior approach;  Surgeon: Marybelle Killings, MD;  Location: [facility];  Service: Orthopedics;  Laterality: Right;  . APPENDECTOMY    . bilateral foot surgery     hammer toes  . BLADDER SUSPENSION N/A 02/02/2016   Procedure: TRANSVAGINAL TAPE (TVT) Mid-Urethral Sling PROCEDURE;  Surgeon: Azucena Fallen, MD;  Location: [facility];  Service: Gynecology;  Laterality: N/A;  . COLONOSCOPY  12/08/2011   JQB:HALPFXTK hemorrhoids/Sigmoid diverticulosis. Colonic polyp (tubular adenoma). Surveillance 2018  . CYSTOSCOPY N/A 02/02/2016   Procedure: CYSTOSCOPY;  Surgeon: Azucena Fallen, MD;  Location: [facility];  Service: Gynecology;  Laterality: N/A;  . ESOPHAGOGASTRODUODENOSCOPY N/A 09/16/2014   Procedure: ESOPHAGOGASTRODUODENOSCOPY (EGD);  Surgeon: Daneil Dolin, MD;  [facility];  Service: Endoscopy;  Laterality: N/A;  1030am  . KNEE SURGERY Right  2011   arthroscopy  . MALONEY DILATION N/A 09/16/2014   Procedure: Venia Minks DILATION;  Surgeon: Daneil Dolin, MD;  [facility];  Service: Endoscopy;  Laterality:  N/A;  . 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 TOTAL HIP ARTHROPLASTY Right 11/04/2014   Procedure: TOTAL HIP ARTHROPLASTY ANTERIOR APPROACH, Possible Local Bonegrafting Acetabular Cyst;  Surgeon: Marybelle Killings, MD;  Location: [place];  Service: Orthopedics;  Laterality: Right;  . TUBAL LIGATION    . VAGINAL HYSTERECTOMY N/A 02/02/2016   Procedure: HYSTERECTOMY VAGINAL;  Surgeon: Azucena Fallen, MD;  Location: [facility];  Service: Gynecology;  Laterality: N/A;    Current Outpatient Medications  Medication Sig Dispense Refill  . acetaminophen (TYLENOL) 500 MG tablet Take 500 mg by mouth every 6 (six) hours as needed.    . Calcium Carbonate-Simethicone (ALKA-SELTZER HEARTBURN + GAS) 750-80 MG CHEW Chew by mouth as needed.    . Cats Claw, Uncaria tomentosa, (CATS CLAW PO) Take 2 capsules by mouth daily.    . Cyanocobalamin 200 MCG/SPRAY LIQD Take 1 spray by mouth daily.    Marland Kitchen ibuprofen (ADVIL,MOTRIN) 200 MG tablet Take 200 mg by mouth every 6 (six) hours as needed for moderate pain.    . Multiple Vitamins-Minerals (ZINC PO) Take 1 tablet by mouth daily.    Marland Kitchen omeprazole (PRILOSEC) 20 MG capsule Take 20 mg by mouth as needed.     . predniSONE (DELTASONE) 5 MG tablet Take 5 mg by mouth as needed.     No current facility-administered medications for this visit.     Allergies as of 08/22/2017 - Review Complete 08/22/2017  Allergen Reaction Noted  . Clindamycin/lincomycin Itching 09/04/2014  . Prochlorperazine Other (See Comments) 06/18/2013    Family History  Problem Relation Age of Onset  . Colon polyps Father   . Cancer Father   . Heart disease Father   . Heart disease Other   . Colon cancer Neg Hx     Social History   Socioeconomic History  . Marital status: Married    Spouse name: None  . Number of children: None  . Years of education: None  . Highest education level: None  Social Needs  . Financial resource strain: None  . Food insecurity - worry: None  . Food insecurity -  inability: None  . Transportation needs - medical: None  . Transportation needs - non-medical: None  Occupational History  . Occupation: tobacco farmer  Tobacco Use  . Smoking status: Former Smoker    Packs/day: 0.50    Years: 10.00    Pack years: 5.00    Types: Cigarettes  . Smokeless tobacco: Never Used  Substance and Sexual Activity  . Alcohol use: No    Alcohol/week: 0.0 oz  . Drug use: No  . Sexual activity: Not Currently    Birth control/protection: None  Other Topics Concern  . None  Social History Narrative  . None    Review of Systems: General: Negative for anorexia, weight loss, fever, chills, fatigue, weakness.  ENT: Negative for hoarseness, difficulty swallowing. CV: Negative for chest pain, angina, palpitations, peripheral edema.  Respiratory: Negative for dyspnea at rest, cough, sputum, wheezing.  GI: See history of present illness. MS: Admits chronic joint pain - hip and knee  Derm: Negative for rash or itching.  Endo: Negative for unusual weight change.  Heme: Negative for bruising or bleeding.   Physical Exam: BP (!) 146/87   Pulse 86   Temp (!) 97.2 F (36.2  C) (Oral)   Ht 5\' 5"  (1.651 m)   Wt 211 lb 6.4 oz (95.9 kg)   BMI 35.18 kg/m  General:   Obese female. Alert and oriented. Pleasant and cooperative. Well-nourished and well-developed.  Head:  Normocephalic and atraumatic. Eyes:  Without icterus, sclera clear and conjunctiva pink.  Ears:  Normal auditory acuity. Cardiovascular:  S1, S2 present without murmurs appreciated. Extremities without clubbing or edema. Respiratory:  Clear to auscultation bilaterally. No wheezes, rales, or rhonchi. No distress.  Gastrointestinal:  +BS, soft, and non-distended. Mild epigastric TTP. No HSM noted. No guarding or rebound. No masses appreciated.  Rectal:  Deferred  Musculoskalatal:  Symmetrical without gross deformities. Neurologic:  Alert and oriented x4;  grossly normal neurologically. Psych:  Alert and  cooperative. Normal mood and affect. Heme/Lymph/Immune: No excessive bruising noted.    08/22/2017 10:18 AM   Disclaimer: This note was dictated with voice recognition software. Similar sounding words can inadvertently be transcribed and may not be corrected upon review.

## 2017-08-22 NOTE — Progress Notes (Signed)
CC'ED TO PCP 

## 2017-08-22 NOTE — Assessment & Plan Note (Signed)
History of tubular adenoma of the colon.  She is currently due for colonoscopy.  At this point we will proceed with colonoscopy as previously recommended.  Return for follow-up in 3 months.  Proceed with TCS with Dr. Gala Romney in near future: the risks, benefits, and alternatives have been discussed with the patient in detail. The patient states understanding and desires to proceed.  The patient is not on any anticoagulants, anxiolytics, chronic pain medications, or antidepressants.  Conscious sedation should be adequate for her procedure as it was for her last.

## 2017-08-22 NOTE — Assessment & Plan Note (Signed)
Chronic constipation previously failed MiraLAX and Linzess.  She does not remember if Amitiza worked.  She declines further treatment with Amitiza because she does not like to take medications daily and prefers "all natural" substances.  Of note she did also fail fiber supplement.  She does continue to take intermittent over-the-counter laxatives.  Return for follow-up in 3 months to further evaluate.

## 2017-08-22 NOTE — Patient Instructions (Addendum)
1. It is best to take your acid blocker daily for most effectiveness. 2. We will schedule your procedure for you. 3. Avoid foods that trigger your symptoms.  Further food information as below. 4. We recommend you avoid NSAIDs (ibuprofen, Motrin, Advil, Aleve, naproxen, Naprosyn, anything with "NSAID" on the bottle).  This can make your heartburn symptoms worse. 5. Follow-up in 3 months. 6. Call us before then if you have any questions or concerns.    Food Choices for Gastroesophageal Reflux Disease, Adult When you have gastroesophageal reflux disease (GERD), the foods you eat and your eating habits are very important. Choosing the right foods can help ease the discomfort of GERD. Consider working with a diet and nutrition specialist (dietitian) to help you make healthy food choices. What general guidelines should I follow? Eating plan  Choose healthy foods low in fat, such as fruits, vegetables, whole grains, low-fat dairy products, and lean meat, fish, and poultry.  Eat frequent, small meals instead of three large meals each day. Eat your meals slowly, in a relaxed setting. Avoid bending over or lying down until 2-3 hours after eating.  Limit high-fat foods such as fatty meats or fried foods.  Limit your intake of oils, butter, and shortening to less than 8 teaspoons each day.  Avoid the following: ? Foods that cause symptoms. These may be different for different people. Keep a food diary to keep track of foods that cause symptoms. ? Alcohol. ? Drinking large amounts of liquid with meals. ? Eating meals during the 2-3 hours before bed.  Cook foods using methods other than frying. This may include baking, grilling, or broiling. Lifestyle   Maintain a healthy weight. Ask your health care provider what weight is healthy for you. If you need to lose weight, work with your health care provider to do so safely.  Exercise for at least 30 minutes on 5 or more days each week, or as told by  your health care provider.  Avoid wearing clothes that fit tightly around your waist and chest.  Do not use any products that contain nicotine or tobacco, such as cigarettes and e-cigarettes. If you need help quitting, ask your health care provider.  Sleep with the head of your bed raised. Use a wedge under the mattress or blocks under the bed frame to raise the head of the bed. What foods are not recommended? The items listed may not be a complete list. Talk with your dietitian about what dietary choices are best for you. Grains Pastries or quick breads with added fat. [place] toast. Vegetables Deep fried vegetables. [place] fries. Any vegetables prepared with added fat. Any vegetables that cause symptoms. For some people this may include tomatoes and tomato products, chili peppers, onions and garlic, and horseradish. Fruits Any fruits prepared with added fat. Any fruits that cause symptoms. For some people this may include citrus fruits, such as oranges, grapefruit, pineapple, and lemons. Meats and other protein foods High-fat meats, such as fatty beef or pork, hot dogs, ribs, ham, sausage, salami and bacon. Fried meat or protein, including fried fish and fried chicken. Nuts and nut butters. Dairy Whole milk and chocolate milk. Sour cream. Cream. Ice cream. Cream cheese. Milk shakes. Beverages Coffee and tea, with or without caffeine. Carbonated beverages. Sodas. Energy drinks. Fruit juice made with acidic fruits (such as orange or grapefruit). Tomato juice. Alcoholic drinks. Fats and oils Butter. Margarine. Shortening. Ghee. Sweets and desserts Chocolate and cocoa. Donuts. Seasoning and other foods Pepper. Peppermint  and spearmint. Any condiments, herbs, or seasonings that cause symptoms. For some people, this may include curry, hot sauce, or vinegar-based salad dressings. Summary  When you have gastroesophageal reflux disease (GERD), food and lifestyle choices are very important to  help ease the discomfort of GERD.  Eat frequent, small meals instead of three large meals each day. Eat your meals slowly, in a relaxed setting. Avoid bending over or lying down until 2-3 hours after eating.  Limit high-fat foods such as fatty meat or fried foods. This information is not intended to replace advice given to you by your health care provider. Make sure you discuss any questions you have with your health care provider. Document Released: 08/02/2005 Document Revised: 08/03/2016 Document Reviewed: 08/03/2016 Elsevier Interactive Patient Education  [employer].

## 2017-08-22 NOTE — Assessment & Plan Note (Signed)
Persistent GERD symptoms, takes PPI "as needed."  She does take Alka-Seltzer heartburn regularly.  Reinforced the need to take PPI daily for effectiveness.  Her heartburn is several times a week but not every day.  She did have a history of H. pylori and was never checked for eradication after successful treatment.  She declines a breath test for eradication at this time.  Discussed avoiding triggers and avoiding NSAIDs.  She does take ibuprofen daily.  Discussed how this can make her symptoms worse.  She verbalized understanding.  Return for follow-up in 3 months.

## 2017-08-22 NOTE — Progress Notes (Signed)
Referring Provider: [name], MD Primary Care Physician:  Elenore Paddy, FNP Primary GI:  Dr. Gala Romney  Chief Complaint  Patient presents with  . Colonoscopy  . Constipation    f/u.   Marland Kitchen Gastroesophageal Reflux    c/o CP, throat burning when bends over    HPI:   Kathy Dawson is a 62 y.o. female who presents for follow-up and scheduling of 5 your colonoscopy as well as follow-up on constipation and GERD.  The patient was last seen in our office 08/29/2014 for GERD, dysphasia, constipation.  History of IDA requiring IV iron infusion with improvement in hemoglobin and ferritin.  Noted severe indigestion, early satiety, upper abdominal pressure.  Occasional solid food dysphasia.  Taking Prilosec over-the-counter.  No GI bleeding noted.  Also with chronic constipation and Linzess caused diarrhea.  Tried supplemental fiber without improvement.  Colonoscopy up-to-date, start Amitiza 8 mcg twice a day with samples provided, EGD with possible dilation, change to prescription Protonix.  EGD completed 09/16/2014 which found a single duodenal erosion of doubtful clinical significance, status post Maloney dilation, status post gastric and duodenal biopsy.  Recommended Protonix 40 mg daily.  Continue Amitiza.  Vertical pathology found the duodenal biopsy to be benign mucosa.  Gastric biopsy positive for H. pylori.  She was treated with PrevPak.  Last colonoscopy completed 12/08/2011 which found internal hemorrhoids as likely source of hematochezia, sigmoid diverticulosis, colon polyp status post removal, colonic lipoma.  Recommended Anusol and constipation treatment.  Surgical pathology found the polyp to be tubular adenoma.  Recommended 5-year repeat exam.  Today she states she only takes Prilosec prn. Takes Ibuprofen daily. Has intermittent GERD symptoms. Explained need to take PPI daily. Also uses Alka Seltzer heartburn frequently. Has GERD symptoms about several times a week. Feels a large part of her  symptoms are diet-related. Continued constipation, not currently on anything for constipation other than intermittent OTC laxatives. No longer on Amitiza. Previously failed fiber, MiraLAX, Linzess. States she doesn't like taking daily medications. Denies any other abdominal pain, N/V, fever, chills, unintentional weight loss, hematochezia, melena. Denies chest pain, dyspnea, dizziness, lightheadedness, syncope, near syncope. Denies any other upper or lower GI symptoms.  Past Medical History:  Diagnosis Date  . Anemia    Hx  . Anemia, iron deficiency 06/09/2013  . Arthritis    knees, hips, elbow  . Complication of anesthesia 14 yrs. ago   woke up during the appendectomy  . GERD (gastroesophageal reflux disease)    otc med prn  . Knee pain     Past Surgical History:  Procedure Laterality Date  . ANTERIOR HIP REVISION Right 11/18/2014   Procedure: Right Total Hip Arthroplasty Femoral Revision-Anteior approach;  Surgeon: Marybelle Killings, MD;  Location: [facility];  Service: Orthopedics;  Laterality: Right;  . APPENDECTOMY    . bilateral foot surgery     hammer toes  . BLADDER SUSPENSION N/A 02/02/2016   Procedure: TRANSVAGINAL TAPE (TVT) Mid-Urethral Sling PROCEDURE;  Surgeon: Azucena Fallen, MD;  Location: [facility];  Service: Gynecology;  Laterality: N/A;  . COLONOSCOPY  12/08/2011   PIR:JJOACZYS hemorrhoids/Sigmoid diverticulosis. Colonic polyp (tubular adenoma). Surveillance 2018  . CYSTOSCOPY N/A 02/02/2016   Procedure: CYSTOSCOPY;  Surgeon: Azucena Fallen, MD;  Location: [facility];  Service: Gynecology;  Laterality: N/A;  . ESOPHAGOGASTRODUODENOSCOPY N/A 09/16/2014   Procedure: ESOPHAGOGASTRODUODENOSCOPY (EGD);  Surgeon: Daneil Dolin, MD;  [facility];  Service: Endoscopy;  Laterality: N/A;  1030am  . KNEE SURGERY Right  2011   arthroscopy  . MALONEY DILATION N/A 09/16/2014   Procedure: Venia Minks DILATION;  Surgeon: Daneil Dolin, MD;  [facility];  Service: Endoscopy;  Laterality:  N/A;  . 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 TOTAL HIP ARTHROPLASTY Right 11/04/2014   Procedure: TOTAL HIP ARTHROPLASTY ANTERIOR APPROACH, Possible Local Bonegrafting Acetabular Cyst;  Surgeon: Marybelle Killings, MD;  Location: [facility];  Service: Orthopedics;  Laterality: Right;  . TUBAL LIGATION    . VAGINAL HYSTERECTOMY N/A 02/02/2016   Procedure: HYSTERECTOMY VAGINAL;  Surgeon: Azucena Fallen, MD;  Location: [facility];  Service: Gynecology;  Laterality: N/A;    Current Outpatient Medications  Medication Sig Dispense Refill  . acetaminophen (TYLENOL) 500 MG tablet Take 500 mg by mouth every 6 (six) hours as needed.    . Calcium Carbonate-Simethicone (ALKA-SELTZER HEARTBURN + GAS) 750-80 MG CHEW Chew by mouth as needed.    . Cats Claw, Uncaria tomentosa, (CATS CLAW PO) Take 2 capsules by mouth daily.    . Cyanocobalamin 200 MCG/SPRAY LIQD Take 1 spray by mouth daily.    Marland Kitchen ibuprofen (ADVIL,MOTRIN) 200 MG tablet Take 200 mg by mouth every 6 (six) hours as needed for moderate pain.    . Multiple Vitamins-Minerals (ZINC PO) Take 1 tablet by mouth daily.    Marland Kitchen omeprazole (PRILOSEC) 20 MG capsule Take 20 mg by mouth as needed.     . predniSONE (DELTASONE) 5 MG tablet Take 5 mg by mouth as needed.     No current facility-administered medications for this visit.     Allergies as of 08/22/2017 - Review Complete 08/22/2017  Allergen Reaction Noted  . Clindamycin/lincomycin Itching 09/04/2014  . Prochlorperazine Other (See Comments) 06/18/2013    Family History  Problem Relation Age of Onset  . Colon polyps Father   . Cancer Father   . Heart disease Father   . Heart disease Other   . Colon cancer Neg Hx     Social History   Socioeconomic History  . Marital status: Married    Spouse name: None  . Number of children: None  . Years of education: None  . Highest education level: None  Social Needs  . Financial resource strain: None  . Food insecurity - worry: None  . Food insecurity -  inability: None  . Transportation needs - medical: None  . Transportation needs - non-medical: None  Occupational History  . Occupation: tobacco farmer  Tobacco Use  . Smoking status: Former Smoker    Packs/day: 0.50    Years: 10.00    Pack years: 5.00    Types: Cigarettes  . Smokeless tobacco: Never Used  Substance and Sexual Activity  . Alcohol use: No    Alcohol/week: 0.0 oz  . Drug use: No  . Sexual activity: Not Currently    Birth control/protection: None  Other Topics Concern  . None  Social History Narrative  . None    Review of Systems: General: Negative for anorexia, weight loss, fever, chills, fatigue, weakness.  ENT: Negative for hoarseness, difficulty swallowing. CV: Negative for chest pain, angina, palpitations, peripheral edema.  Respiratory: Negative for dyspnea at rest, cough, sputum, wheezing.  GI: See history of present illness. MS: Admits chronic joint pain - hip and knee  Derm: Negative for rash or itching.  Endo: Negative for unusual weight change.  Heme: Negative for bruising or bleeding.   Physical Exam: BP (!) 146/87   Pulse 86   Temp (!) 97.2 F (36.2  C) (Oral)   Ht 5\' 5"  (1.651 m)   Wt 211 lb 6.4 oz (95.9 kg)   BMI 35.18 kg/m  General:   Obese female. Alert and oriented. Pleasant and cooperative. Well-nourished and well-developed.  Head:  Normocephalic and atraumatic. Eyes:  Without icterus, sclera clear and conjunctiva pink.  Ears:  Normal auditory acuity. Cardiovascular:  S1, S2 present without murmurs appreciated. Extremities without clubbing or edema. Respiratory:  Clear to auscultation bilaterally. No wheezes, rales, or rhonchi. No distress.  Gastrointestinal:  +BS, soft, and non-distended. Mild epigastric TTP. No HSM noted. No guarding or rebound. No masses appreciated.  Rectal:  Deferred  Musculoskalatal:  Symmetrical without gross deformities. Neurologic:  Alert and oriented x4;  grossly normal neurologically. Psych:  Alert and  cooperative. Normal mood and affect. Heme/Lymph/Immune: No excessive bruising noted.    08/22/2017 10:18 AM   Disclaimer: This note was dictated with voice recognition software. Similar sounding words can inadvertently be transcribed and may not be corrected upon review.

## 2017-08-25 ENCOUNTER — Telehealth: Payer: Self-pay

## 2017-08-25 ENCOUNTER — Other Ambulatory Visit: Payer: Self-pay

## 2017-08-25 MED ORDER — PEG 3350-KCL-NA BICARB-NACL 420 G PO SOLR: 4000.0000 mL | ORAL | 0 refills | Status: DC

## 2017-08-25 NOTE — Telephone Encounter (Signed)
[employer] called office. Clenpiq was denied. Called pt, she was given Clenpiq coupon at [place]. She gave the coupon to pharmacy. Called [employer]. Clenpiq was denied and she will not be able to use the coupon. Rx for Tri-Lyte sent to pharmacy. Called and informed pt. New instructions mailed.

## 2017-09-09 ENCOUNTER — Encounter (HOSPITAL_COMMUNITY)
Admission: RE | Disposition: A | Discharge status: Home / Self Care | Payer: Self-pay | Source: Ambulatory Visit | Attending: Internal Medicine

## 2017-09-09 ENCOUNTER — Encounter (HOSPITAL_COMMUNITY): Payer: Self-pay | Admitting: *Deleted

## 2017-09-09 ENCOUNTER — Other Ambulatory Visit: Payer: Self-pay

## 2017-09-09 ENCOUNTER — Ambulatory Visit (HOSPITAL_COMMUNITY)
Admission: RE | Admit: 2017-09-09 | Discharge: 2017-09-09 | Disposition: A | Discharge status: Home / Self Care | Payer: BLUE CROSS/BLUE SHIELD | Source: Ambulatory Visit | Attending: Internal Medicine | Admitting: Internal Medicine

## 2017-09-09 DIAGNOSIS — Z888 Allergy status to other drugs, medicaments and biological substances status: Secondary | ICD-10-CM | POA: Diagnosis not present

## 2017-09-09 DIAGNOSIS — Z86010 Personal history of colonic polyps: Secondary | ICD-10-CM | POA: Insufficient documentation

## 2017-09-09 DIAGNOSIS — Z8619 Personal history of other infectious and parasitic diseases: Secondary | ICD-10-CM | POA: Insufficient documentation

## 2017-09-09 DIAGNOSIS — D126 Benign neoplasm of colon, unspecified: Secondary | ICD-10-CM

## 2017-09-09 DIAGNOSIS — Z79899 Other long term (current) drug therapy: Secondary | ICD-10-CM | POA: Diagnosis not present

## 2017-09-09 DIAGNOSIS — Z87891 Personal history of nicotine dependence: Secondary | ICD-10-CM | POA: Diagnosis not present

## 2017-09-09 DIAGNOSIS — Z96641 Presence of right artificial hip joint: Secondary | ICD-10-CM | POA: Insufficient documentation

## 2017-09-09 DIAGNOSIS — Z90710 Acquired absence of both cervix and uterus: Secondary | ICD-10-CM | POA: Diagnosis not present

## 2017-09-09 DIAGNOSIS — M19029 Primary osteoarthritis, unspecified elbow: Secondary | ICD-10-CM | POA: Insufficient documentation

## 2017-09-09 DIAGNOSIS — R1310 Dysphagia, unspecified: Secondary | ICD-10-CM | POA: Insufficient documentation

## 2017-09-09 DIAGNOSIS — M170 Bilateral primary osteoarthritis of knee: Secondary | ICD-10-CM | POA: Insufficient documentation

## 2017-09-09 DIAGNOSIS — Z8249 Family history of ischemic heart disease and other diseases of the circulatory system: Secondary | ICD-10-CM | POA: Insufficient documentation

## 2017-09-09 DIAGNOSIS — M160 Bilateral primary osteoarthritis of hip: Secondary | ICD-10-CM | POA: Insufficient documentation

## 2017-09-09 DIAGNOSIS — D509 Iron deficiency anemia, unspecified: Secondary | ICD-10-CM | POA: Diagnosis not present

## 2017-09-09 DIAGNOSIS — Z8371 Family history of colonic polyps: Secondary | ICD-10-CM | POA: Diagnosis not present

## 2017-09-09 DIAGNOSIS — Z1211 Encounter for screening for malignant neoplasm of colon: Secondary | ICD-10-CM | POA: Diagnosis not present

## 2017-09-09 DIAGNOSIS — K219 Gastro-esophageal reflux disease without esophagitis: Secondary | ICD-10-CM | POA: Insufficient documentation

## 2017-09-09 DIAGNOSIS — K5909 Other constipation: Secondary | ICD-10-CM | POA: Insufficient documentation

## 2017-09-09 DIAGNOSIS — Z809 Family history of malignant neoplasm, unspecified: Secondary | ICD-10-CM | POA: Diagnosis not present

## 2017-09-09 DIAGNOSIS — K5730 Diverticulosis of large intestine without perforation or abscess without bleeding: Secondary | ICD-10-CM | POA: Diagnosis not present

## 2017-09-09 HISTORY — PX: COLONOSCOPY: SHX5424

## 2017-09-09 SURGERY — COLONOSCOPY
Anesthesia: Moderate Sedation

## 2017-09-09 MED ORDER — SODIUM CHLORIDE 0.9 % IV SOLN
INTRAVENOUS | Status: DC
  Administered 2017-09-09: 1000 mL via INTRAVENOUS

## 2017-09-09 MED ORDER — MEPERIDINE HCL 100 MG/ML IJ SOLN
INTRAMUSCULAR | Status: DC | PRN
  Administered 2017-09-09: 50 mg via INTRAVENOUS
  Administered 2017-09-09: 25 mg via INTRAVENOUS
  Administered 2017-09-09: 50 mg via INTRAVENOUS

## 2017-09-09 MED ORDER — MIDAZOLAM HCL 5 MG/5ML IJ SOLN
INTRAMUSCULAR | Status: DC | PRN
  Administered 2017-09-09: 2 mg via INTRAVENOUS
  Administered 2017-09-09: 1 mg via INTRAVENOUS
  Administered 2017-09-09: 2 mg via INTRAVENOUS

## 2017-09-09 MED ORDER — ONDANSETRON HCL 4 MG/2ML IJ SOLN
INTRAMUSCULAR | Status: DC | PRN
  Administered 2017-09-09: 4 mg via INTRAVENOUS

## 2017-09-09 MED ORDER — ONDANSETRON HCL 4 MG/2ML IJ SOLN
INTRAMUSCULAR | Status: AC
  Filled 2017-09-09: qty 2

## 2017-09-09 MED ORDER — MEPERIDINE HCL 100 MG/ML IJ SOLN
INTRAMUSCULAR | Status: AC
  Filled 2017-09-09: qty 2

## 2017-09-09 MED ORDER — MIDAZOLAM HCL 5 MG/5ML IJ SOLN
INTRAMUSCULAR | Status: AC
  Filled 2017-09-09: qty 10

## 2017-09-09 NOTE — Discharge Instructions (Signed)
°Colonoscopy °Discharge Instructions ° °Read the instructions outlined below and refer to this sheet in the next few weeks. These discharge instructions provide you with general information on caring for yourself after you leave the hospital. Your doctor may also give you specific instructions. While your treatment has been planned according to the most current medical practices available, unavoidable complications occasionally occur. If you have any problems or questions after discharge, call Dr. Rourk at [phone]. °ACTIVITY °· You may resume your regular activity, but move at a slower pace for the next 24 hours.  °· Take frequent rest periods for the next 24 hours.  °· Walking will help get rid of the air and reduce the bloated feeling in your belly (abdomen).  °· No driving for 24 hours (because of the medicine (anesthesia) used during the test).   °· Do not sign any important legal documents or operate any machinery for 24 hours (because of the anesthesia used during the test).  °NUTRITION °· Drink plenty of fluids.  °· You may resume your normal diet as instructed by your doctor.  °· Begin with a light meal and progress to your normal diet. Heavy or fried foods are harder to digest and may make you feel sick to your stomach (nauseated).  °· Avoid alcoholic beverages for 24 hours or as instructed.  °MEDICATIONS °· You may resume your normal medications unless your doctor tells you otherwise.  °WHAT YOU CAN EXPECT TODAY °· Some feelings of bloating in the abdomen.  °· Passage of more gas than usual.  °· Spotting of blood in your stool or on the toilet paper.  °IF YOU HAD POLYPS REMOVED DURING THE COLONOSCOPY: °· No aspirin products for 7 days or as instructed.  °· No alcohol for 7 days or as instructed.  °· Eat a soft diet for the next 24 hours.  °FINDING OUT THE RESULTS OF YOUR TEST °Not all test results are available during your visit. If your test results are not back during the visit, make an appointment  with your caregiver to find out the results. Do not assume everything is normal if you have not heard from your caregiver or the medical facility. It is important for you to follow up on all of your test results.  °SEEK IMMEDIATE MEDICAL ATTENTION IF: °· You have more than a spotting of blood in your stool.  °· Your belly is swollen (abdominal distention).  °· You are nauseated or vomiting.  °· You have a temperature over 101.  °· You have abdominal pain or discomfort that is severe or gets worse throughout the day.  ° ° °Diverticulosis information provided ° °Repeat colonoscopy in 5 years ° ° °Diverticulosis °Diverticulosis is a condition that develops when small pouches (diverticula) form in the wall of the large intestine (colon). The colon is where water is absorbed and stool is formed. The pouches form when the inside layer of the colon pushes through weak spots in the outer layers of the colon. You may have a few pouches or many of them. °What are the causes? °The cause of this condition is not known. °What increases the risk? °The following factors may make you more likely to develop this condition: °· Being older than age 60. Your risk for this condition increases with age. Diverticulosis is rare among people younger than age 30. By age 80, many people have it. °· Eating a low-fiber diet. °· Having frequent constipation. °· Being overweight. °· Not getting enough exercise. °· Smoking. °·   Taking over-the-counter pain medicines, like aspirin and ibuprofen. °· Having a family history of diverticulosis. ° °What are the signs or symptoms? °In most people, there are no symptoms of this condition. If you do have symptoms, they may include: °· Bloating. °· Cramps in the abdomen. °· Constipation or diarrhea. °· Pain in the lower left side of the abdomen. ° °How is this diagnosed? °This condition is most often diagnosed during an exam for other colon problems. Because diverticulosis usually has no symptoms, it often  cannot be diagnosed independently. This condition may be diagnosed by: °· Using a flexible scope to examine the colon (colonoscopy). °· Taking an X-ray of the colon after dye has been put into the colon (barium enema). °· Doing a CT scan. ° °How is this treated? °You may not need treatment for this condition if you have never developed an infection related to diverticulosis. If you have had an infection before, treatment may include: °· Eating a high-fiber diet. This may include eating more fruits, vegetables, and grains. °· Taking a fiber supplement. °· Taking a live bacteria supplement (probiotic). °· Taking medicine to relax your colon. °· Taking antibiotic medicines. ° °Follow these instructions at home: °· Drink 6-8 glasses of water or more each day to prevent constipation. °· Try not to strain when you have a bowel movement. °· If you have had an infection before: °? Eat more fiber as directed by your health care provider or your diet and nutrition specialist (dietitian). °? Take a fiber supplement or probiotic, if your health care provider approves. °· Take over-the-counter and prescription medicines only as told by your health care provider. °· If you were prescribed an antibiotic, take it as told by your health care provider. Do not stop taking the antibiotic even if you start to feel better. °· Keep all follow-up visits as told by your health care provider. This is important. °Contact a health care provider if: °· You have pain in your abdomen. °· You have bloating. °· You have cramps. °· You have not had a bowel movement in 3 days. °Get help right away if: °· Your pain gets worse. °· Your bloating becomes very bad. °· You have a fever or chills, and your symptoms suddenly get worse. °· You vomit. °· You have bowel movements that are bloody or black. °· You have bleeding from your rectum. °Summary °· Diverticulosis is a condition that develops when small pouches (diverticula) form in the wall of the large  intestine (colon). °· You may have a few pouches or many of them. °· This condition is most often diagnosed during an exam for other colon problems. °· If you have had an infection related to diverticulosis, treatment may include increasing the fiber in your diet, taking supplements, or taking medicines. °This information is not intended to replace advice given to you by your health care provider. Make sure you discuss any questions you have with your health care provider. °Document Released: 04/29/2004 Document Revised: 06/21/2016 Document Reviewed: 06/21/2016 °Elsevier Interactive Patient Education © 2017 [employer] ° °

## 2017-09-09 NOTE — Interval H&P Note (Addendum)
History and Physical Interval Note:  09/09/2017 1:42 PM  Kathy Dawson  has presented today for surgery, with the diagnosis of history tubular adenoma  The various methods of treatment have been discussed with the patient and family. After consideration of risks, benefits and other options for treatment, the patient has consented to  Procedure(s) with comments: COLONOSCOPY (N/A) - 2:15pm as a surgical intervention .  The patient's history has been reviewed, patient examined, no change in status, stable for surgery.  I have reviewed the patient's chart and labs.  Questions were answered to the patient's satisfaction.     Elam Ellis  No change. Surveillance colonoscopy per plan.  The risks, benefits, limitations, alternatives and imponderables have been reviewed with the patient. Questions have been answered. All parties are agreeable.

## 2017-09-09 NOTE — Op Note (Signed)
[facility] Patient Name: Kathy Dawson Procedure Date: 09/09/2017 1:11 PM MRN: [id number] Date of Birth: 19-May-1956 Attending MD: Norvel Richards , MD CSN: [id number] Age: 62 Admit Type: Outpatient Procedure:                Colonoscopy Indications:              High risk colon cancer surveillance: Personal                            history of colonic polyps Providers:                Norvel Richards, MD, Lurline Del, RN, Aram Candela Referring MD:              Medicines:                Midazolam 5 mg IV, Meperidine 125 mg IV,                            Ondansetron 4 mg IV Complications:            No immediate complications. Estimated Blood Loss:     Estimated blood loss: none. Procedure:                Pre-Anesthesia Assessment:                           - Prior to the procedure, a History and Physical                            was performed, and patient medications and                            allergies were reviewed. The patient's tolerance of                            previous anesthesia was also reviewed. The risks                            and benefits of the procedure and the sedation                            options and risks were discussed with the patient.                            All questions were answered, and informed consent                            was obtained. Prior Anticoagulants: The patient has                            taken no previous anticoagulant or antiplatelet  agents. ASA Grade Assessment: II - A patient with                            mild systemic disease. After reviewing the risks                            and benefits, the patient was deemed in                            satisfactory condition to undergo the procedure.                           After obtaining informed consent, the colonoscope                            was passed under direct vision. Throughout the                        procedure, the patient's blood pressure, pulse, and                            oxygen saturations were monitored continuously. The                            EC-[id number] ([id number]) scope was introduced through                            the anus and advanced to the the cecum, identified                            by appendiceal orifice and ileocecal valve. The                            colonoscopy was performed without difficulty. The                            patient tolerated the procedure well. The quality                            of the bowel preparation was adequate. The                            ileocecal valve, appendiceal orifice, and rectum                            were photographed. The colonoscopy was performed                            without difficulty. The patient tolerated the                            procedure well. The quality of the bowel  preparation was adequate. Scope In: 1:59:22 PM Scope Out: 2:11:51 PM Scope Withdrawal Time: 0 hours 7 minutes 25 seconds  Total Procedure Duration: 0 hours 12 minutes 29 seconds  Findings:      The perianal and digital rectal examinations were normal.      Scattered small and large-mouthed diverticula were found in the sigmoid       colon and descending colon.      The exam was otherwise without abnormality on direct and retroflexion       views. Impression:               - Diverticulosis in the sigmoid colon and in the                            descending colon.                           - The examination was otherwise normal on direct                            and retroflexion views.                           - No specimens collected. Moderate Sedation:      Moderate (conscious) sedation was administered by the endoscopy nurse       and supervised by the endoscopist. The following parameters were       monitored: oxygen saturation, heart rate, blood pressure, respiratory        rate, EKG, adequacy of pulmonary ventilation, and response to care.       Total physician intraservice time was 22 minutes. Recommendation:           - Patient has a contact number available for                            emergencies. The signs and symptoms of potential                            delayed complications were discussed with the                            patient. Return to normal activities tomorrow.                            Written discharge instructions were provided to the                            patient.                           - Resume previous diet.                           - Continue present medications.                           - Repeat colonoscopy in 5 years for surveillance.                           -  Return to GI clinic in 2 months. Procedure Code(s):        --- Professional ---                           [phone], Colonoscopy, flexible; diagnostic, including                            collection of specimen(s) by brushing or washing,                            when performed (separate procedure)                           99152, Moderate sedation services provided by the                            same physician or other qualified health care                            professional performing the diagnostic or                            therapeutic service that the sedation supports,                            requiring the presence of an independent trained                            observer to assist in the monitoring of the                            patient's level of consciousness and physiological                            status; initial 15 minutes of intraservice time,                            patient age 50 years or older Diagnosis Code(s):        --- Professional ---                           Z86.010, Personal history of colonic polyps                           K57.30, Diverticulosis of large intestine without                             perforation or abscess without bleeding CPT copyright 2016 [employer]. All rights reserved. The codes documented in this report are preliminary and upon coder review may  be revised to meet current compliance requirements. [name]. Dereck Agerton, MD Norvel Richards, MD 09/09/2017 2:22:52 PM This report has been signed electronically. Number of Addenda: 0

## 2017-09-15 ENCOUNTER — Encounter (HOSPITAL_COMMUNITY): Payer: Self-pay | Admitting: Internal Medicine

## 2017-11-29 ENCOUNTER — Ambulatory Visit: Payer: BLUE CROSS/BLUE SHIELD | Admitting: Nurse Practitioner

## 2018-06-09 ENCOUNTER — Other Ambulatory Visit (HOSPITAL_COMMUNITY): Payer: BLUE CROSS/BLUE SHIELD

## 2018-06-09 ENCOUNTER — Ambulatory Visit (HOSPITAL_COMMUNITY): Payer: BLUE CROSS/BLUE SHIELD | Admitting: Hematology

## 2019-08-21 ENCOUNTER — Other Ambulatory Visit: Payer: Self-pay

## 2019-08-21 ENCOUNTER — Ambulatory Visit: Payer: 59 | Attending: Internal Medicine

## 2019-08-21 DIAGNOSIS — Z20822 Contact with and (suspected) exposure to covid-19: Secondary | ICD-10-CM

## 2019-08-23 LAB — NOVEL CORONAVIRUS, NAA: SARS-CoV-2, NAA: DETECTED — AB

## 2020-03-02 ENCOUNTER — Emergency Department (HOSPITAL_COMMUNITY): Payer: 59

## 2020-03-02 ENCOUNTER — Other Ambulatory Visit: Payer: Self-pay

## 2020-03-02 ENCOUNTER — Emergency Department (HOSPITAL_COMMUNITY)
Admission: EM | Admit: 2020-03-02 | Discharge: 2020-03-02 | Disposition: A | Discharge status: Home / Self Care | Payer: 59 | Attending: Emergency Medicine | Admitting: Emergency Medicine

## 2020-03-02 ENCOUNTER — Encounter (HOSPITAL_COMMUNITY): Payer: Self-pay

## 2020-03-02 DIAGNOSIS — Z96641 Presence of right artificial hip joint: Secondary | ICD-10-CM | POA: Insufficient documentation

## 2020-03-02 DIAGNOSIS — Z96651 Presence of right artificial knee joint: Secondary | ICD-10-CM | POA: Diagnosis not present

## 2020-03-02 DIAGNOSIS — R55 Syncope and collapse: Secondary | ICD-10-CM | POA: Diagnosis not present

## 2020-03-02 DIAGNOSIS — Z87891 Personal history of nicotine dependence: Secondary | ICD-10-CM | POA: Diagnosis not present

## 2020-03-02 DIAGNOSIS — R42 Dizziness and giddiness: Secondary | ICD-10-CM | POA: Diagnosis present

## 2020-03-02 LAB — CBC
HCT: 39.3 % (ref 36.0–46.0)
Hemoglobin: 13 g/dL (ref 12.0–15.0)
MCH: 30.4 pg (ref 26.0–34.0)
MCHC: 33.1 g/dL (ref 30.0–36.0)
MCV: 92 fL (ref 80.0–100.0)
Platelets: 259 10*3/uL (ref 150–400)
RBC: 4.27 MIL/uL (ref 3.87–5.11)
RDW: 13.2 % (ref 11.5–15.5)
WBC: 4.2 10*3/uL (ref 4.0–10.5)
nRBC: 0 % (ref 0.0–0.2)

## 2020-03-02 LAB — TROPONIN I (HIGH SENSITIVITY)
Troponin I (High Sensitivity): 3 ng/L (ref <18)
Troponin I (High Sensitivity): 2 ng/L (ref <18)

## 2020-03-02 LAB — BASIC METABOLIC PANEL
Anion gap: 7 (ref 5–15)
BUN: 14 mg/dL (ref 8–23)
CO2: 26 mmol/L (ref 22–32)
Calcium: 9.2 mg/dL (ref 8.9–10.3)
Chloride: 108 mmol/L (ref 98–111)
Creatinine, Ser: 0.79 mg/dL (ref 0.44–1.00)
GFR calc Af Amer: >60 mL/min (ref >60)
GFR calc non Af Amer: >60 mL/min (ref >60)
Glucose, Bld: 88 mg/dL (ref 70–99)
Potassium: 3.8 mmol/L (ref 3.5–5.1)
Sodium: 141 mmol/L (ref 135–145)

## 2020-03-02 MED ORDER — SODIUM CHLORIDE 0.9% FLUSH: 3.0000 mL | Freq: Once | INTRAVENOUS | Status: DC

## 2020-03-02 NOTE — ED Triage Notes (Signed)
EMS reports pt started feeling dizzy and fluttering sensation in her chest around 1130 this morning.  Reports fatigue and difficulty breathing.  Room air 02 sat was 94%.  Pt was pain free when they arrived.  Pt took 4baby asa prior to EMS arrival.  Reports vss.  CBG 99.  EMS started 20g IV in R ac and reports sinus on monitor.  HR 80-90's.  bp 141/80, rr 18, 99%.

## 2020-03-02 NOTE — ED Notes (Signed)
Pt ambulated to the bathroom.  

## 2020-03-02 NOTE — ED Provider Notes (Signed)
[facility] Provider Note   CSN: [id number] Arrival date & time: 03/02/20  1518     History Chief Complaint  Patient presents with   Chest Pain    Kathy Dawson is a 64 y.o. female.  HPI She presents for evaluation of a dizzy sensation characterized by "spinning in my head, and lightheadedness."  She states that she had a "funny feeling all over."  At one point she felt like she is going to die.  She began feeling better, on the way to the hospital.  This began, this morning and has improved spontaneously.  She was transferred by EMS and was treated with aspirin during transport.  She has been taking her usual medications.  She denies fever, chills, cough, focal weakness or paresthesia.  She denies recent illnesses.  She had a Covid infection in January of this year, but has not had vaccines.  There are no other known modifying factors.    Past Medical History:  Diagnosis Date   Anemia    Hx   Anemia, iron deficiency 06/09/2013   Arthritis    knees, hips, elbow   Complication of anesthesia 14 yrs. 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   GERD (gastroesophageal reflux disease) 08/29/2014   Tubular adenoma of colon 08/29/2014   Dysphagia, pharyngoesophageal phase 08/29/2014   Anemia, iron deficiency 06/09/2013   Difficulty in walking(719.7) 02/05/2013   Stiffness of right knee 02/05/2013   Rectal bleeding 11/27/2011   Constipation 11/27/2011   DERANGEMENT MENISCUS 08/26/2008   KNEE, ARTHRITIS, DEGEN./OSTEO 07/25/2008   KNEE PAIN 07/25/2008    Past Surgical History:  Procedure Laterality Date   ABDOMINAL HYSTERECTOMY     ANTERIOR HIP REVISION Right 11/18/2014    Procedure: Right Total Hip Arthroplasty Femoral Revision-Anteior approach;  Surgeon: Marybelle Killings, MD;  Location: [place];  Service: Orthopedics;  Laterality: Right;   APPENDECTOMY     bilateral foot surgery     hammer toes   BLADDER SUSPENSION N/A 02/02/2016   Procedure: TRANSVAGINAL TAPE (TVT) Mid-Urethral Sling PROCEDURE;  Surgeon: Azucena Fallen, MD;  Location: [facility];  Service: Gynecology;  Laterality: N/A;   COLONOSCOPY  12/08/2011   BTD:VVOHYWVP hemorrhoids/Sigmoid diverticulosis. Colonic polyp (tubular adenoma). Surveillance 2018   COLONOSCOPY N/A 09/09/2017   Procedure: COLONOSCOPY;  Surgeon: Daneil Dolin, MD;  Location: [facility];  Service: Endoscopy;  Laterality: N/A;  2:15pm   CYSTOSCOPY N/A 02/02/2016   Procedure: CYSTOSCOPY;  Surgeon: Azucena Fallen, MD;  Location: [facility];  Service: Gynecology;  Laterality: N/A;   ESOPHAGOGASTRODUODENOSCOPY N/A 09/16/2014   Procedure: ESOPHAGOGASTRODUODENOSCOPY (EGD);  Surgeon: Daneil Dolin, MD;  Location: [facility];  Service: Endoscopy;  Laterality: N/A;  1030am   KNEE SURGERY Right 2011   arthroscopy   MALONEY DILATION N/A 09/16/2014   Procedure: Venia Minks DILATION;  Surgeon: Daneil Dolin, MD;  Location: [facility];  Service: Endoscopy;  Laterality: N/A;   REPLACEMENT TOTAL KNEE Right 01/19/2013   TOTAL HIP ARTHROPLASTY Right 11/04/2014   Procedure: TOTAL HIP ARTHROPLASTY ANTERIOR APPROACH, Possible Local Bonegrafting Acetabular Cyst;  Surgeon: Marybelle Killings, MD;  Location: [place];  Service: Orthopedics;  Laterality: Right;  TUBAL LIGATION     VAGINAL HYSTERECTOMY N/A 02/02/2016   Procedure: HYSTERECTOMY VAGINAL;  Surgeon: Azucena Fallen, MD;  Location: [facility];  Service: Gynecology;  Laterality: N/A;     OB History    Gravida  4   Para  3   Term  3   Preterm      AB  1   Living        SAB  1   TAB      Ectopic      Multiple      Live Births              Family History  Problem Relation Age of Onset     Colon polyps Father    Cancer Father    Heart disease Father    Heart disease Other    Colon cancer Neg Hx     Social History   Tobacco Use   Smoking status: Former Smoker    Packs/day: 0.50    Years: 10.00    Pack years: 5.00    Types: Cigarettes   Smokeless tobacco: Never Used  Scientific laboratory technician Use: Never used  Substance Use Topics   Alcohol use: No    Alcohol/week: 0.0 standard drinks   Drug use: No    Home Medications Prior to Admission medications   Medication Sig Start Date End Date Taking? Authorizing Provider  acetaminophen (TYLENOL) 500 MG tablet Take 1,000 mg by mouth every 6 (six) hours as needed for moderate pain or headache.    Yes [provider]  aspirin 81 MG chewable tablet Chew 324 mg by mouth once.   Yes [provider]  Ca Carbonate-Mag Hydroxide (ROLAIDS) 550-110 MG CHEW Chew 1 tablet by mouth daily as needed (for indigestion).   Yes [provider]  omeprazole (PRILOSEC OTC) 20 MG tablet Take 20 mg by mouth daily as needed (for heartburn).   Yes [provider]    Allergies    Clindamycin/lincomycin and Prochlorperazine  Review of Systems   Review of Systems  All other systems reviewed and are negative.   Physical Exam Updated Vital Signs BP 102/72    Pulse 79    Temp 98.5 F (36.9 C) (Oral)    Resp 16    Ht 5\' 5"  (1.651 m)    Wt 97.5 kg    SpO2 98%    BMI 35.78 kg/m   Physical Exam Vitals and nursing note reviewed.  Constitutional:      General: She is not in acute distress.    Appearance: She is well-developed. She is not ill-appearing, toxic-appearing or diaphoretic.  HENT:     Head: Normocephalic and atraumatic.     Right Ear: External ear normal.     Left Ear: External ear normal.     Mouth/Throat:     Mouth: Mucous membranes are moist.     Pharynx: No oropharyngeal exudate or posterior oropharyngeal erythema.  Eyes:     Conjunctiva/sclera: Conjunctivae normal.     Pupils:  Pupils are equal, round, and reactive to light.  Neck:     Trachea: Phonation normal.  Cardiovascular:     Rate and Rhythm: Normal rate and regular rhythm.     Heart sounds: Normal heart sounds.  Pulmonary:     Effort: Pulmonary effort is normal.     Breath sounds: Normal breath sounds.  Chest:     Chest wall: No tenderness.  Abdominal:     General:  There is no distension.     Palpations: Abdomen is soft.     Tenderness: There is no abdominal tenderness. There is no guarding.  Musculoskeletal:        General: Normal range of motion.     Cervical back: Normal range of motion and neck supple.  Skin:    General: Skin is warm and dry.  Neurological:     Mental Status: She is alert and oriented to person, place, and time.     Cranial Nerves: No cranial nerve deficit.     Sensory: No sensory deficit.     Motor: No abnormal muscle tone.     Coordination: Coordination normal.     Comments: No dysarthria or aphasia.  No nystagmus or pronator drift.  No ataxia.  Normal strength, arms and legs bilaterally.  Psychiatric:        Mood and Affect: Mood normal.        Behavior: Behavior normal.        Thought Content: Thought content normal.        Judgment: Judgment normal.     ED Results / Procedures / Treatments   Labs (all labs ordered are listed, but only abnormal results are displayed) Labs Reviewed  BASIC METABOLIC PANEL  CBC  TROPONIN I (HIGH SENSITIVITY)  TROPONIN I (HIGH SENSITIVITY)    EKG EKG Interpretation  Date/Time:  Sunday March 02 2020 15:19:24 EDT Ventricular Rate:  82 PR Interval:    QRS Duration: 94 QT Interval:  381 QTC Calculation: 445 R Axis:   -36 Text Interpretation: Sinus rhythm Left axis deviation Abnormal R-wave progression, late transition since last tracing no significant change Confirmed by Daleen Bo [phone]) on 03/02/2020 3:26:32 PM   Radiology DG Chest 2 View  Result Date: 03/02/2020 CLINICAL DATA:  Chest pain EXAM: CHEST - 2 VIEW  COMPARISON:  None. FINDINGS: The heart size and mediastinal contours are within normal limits. Mild prominence of the central pulmonary vasculature. Subsegmental atelectasis seen at both lung bases. No large airspace consolidation or pleural effusion. Aortic knob calcifications are seen. No acute osseous abnormality. IMPRESSION: Mild pulmonary vascular congestion. Electronically Signed   By: Prudencio Pair M.D.   On: 03/02/2020 16:02    Procedures Procedures (including critical care time)  Medications Ordered in ED Medications  sodium chloride flush (NS) 0.9 % injection 3 mL (3 mLs Intravenous Not Given 03/02/20 1551)    ED Course  I have reviewed the triage vital signs and the nursing notes.  Pertinent labs & imaging results that were available during my care of the patient were reviewed by me and considered in my medical decision making (see chart for details).  Clinical Course as of Mar 03 1999  Sun Mar 02, 2020  1940 Troponin I (High Sensitivity) [EW]  1940 Normal, unchanged, delta troponin  Troponin I (High Sensitivity) [EW]  1940 Normal  CBC [EW]  1940 Normal  Basic metabolic panel [EW]  6222 Per radiologist, images are consistent with mild pulmonary vascular congestion.  DG Chest 2 View [EW]    Clinical Course User Index [EW] Daleen Bo, MD   MDM Rules/Calculators/A&P                           Patient Vitals for the past 24 hrs:  BP Temp Temp src Pulse Resp SpO2 Height Weight  03/02/20 1930 102/72 -- -- 79 16 98 % -- --  03/02/20 1830 (!) 104/53 -- --  80 16 96 % -- --  03/02/20 1800 (!) 100/52 -- -- 74 17 97 % -- --  03/02/20 1530 139/79 -- -- 84 18 93 % -- --  03/02/20 1519 (!) 153/83 98.5 F (36.9 C) Oral 80 17 98 % -- --  03/02/20 1518 -- -- -- -- -- -- 5\' 5"  (1.651 m) 97.5 kg    7:47 PM Reevaluation with update and discussion. After initial assessment and treatment, an updated evaluation reveals she is comfortable and just feels a little tired.  Findings  discussed with patient and husband, all questions were answered. Daleen Bo   Medical Decision Making:  This patient is presenting for evaluation of transient lightheadedness, which does require a range of treatment options, and is a complaint that involves a high risk of morbidity and mortality. The differential diagnoses include CVA, cardiac instability, metabolic instability, infection. I decided to review old records, and in summary generally healthy 64 year old female, presenting for new symptoms of near syncope..  I obtained additional historical information from her husband at the bedside.  Clinical Laboratory Tests Ordered, included Troponin with repeat testing, CBC, metabolic panel. Review indicates normal findings{ Radiologic Tests Ordered, included chest x-ray.  I independently Visualized: Radiographic images, which show pulmonary vascular congestion  Cardiac Monitor Tracing which shows normal sinus rhythm   Critical Interventions-clinical evaluation, laboratory testing, EKG, chest x-ray, observation and reassessment  After These Interventions, the Patient was reevaluated and was found comfortable, without complaints.  Constellation of symptoms the patient had, could be consistent with panic attack.  Doubt ACS, PE or pneumonia.  No metabolic instability or serious bacterial infection.  CRITICAL CARE-no Performed by: Daleen Bo  Nursing Notes Reviewed/ Care Coordinated Applicable Imaging Reviewed Interpretation of Laboratory Data incorporated into ED treatment  The patient appears reasonably screened and/or stabilized for discharge and I doubt any other medical condition or other [facility] requiring further screening, evaluation, or treatment in the ED at this time prior to discharge.  Plan: Home Medications-continue usual; Home Treatments-rest, fluids, regular diet; return here if the recommended treatment, does not improve the symptoms; Recommended follow up-PCP checkup next  week.     Final Clinical Impression(s) / ED Diagnoses Final diagnoses:  Near syncope    Rx / DC Orders ED Discharge Orders    None       Daleen Bo, MD 03/02/20 2001

## 2020-03-02 NOTE — Discharge Instructions (Addendum)
The testing today did not show any serious problems.  There was a small amount of fluid in your lungs.  It is important to get plenty of rest and drink a lot of fluids.  The constellation of symptoms that you had does not have a specific diagnosis, at this time.  Please follow-up with your doctor for checkup, next week, for further evaluation and treatment as needed.  You can was return here as needed for problems.

## 2020-03-03 ENCOUNTER — Telehealth: Payer: Self-pay | Admitting: Pulmonary Disease

## 2020-03-03 NOTE — Telephone Encounter (Signed)
Patient moved to Dr. Silas Flood Schedule Monday at 10am.  Nothing further is needed at this time.

## 2020-03-07 ENCOUNTER — Emergency Department (HOSPITAL_COMMUNITY)
Admission: EM | Admit: 2020-03-07 | Discharge: 2020-03-08 | Disposition: A | Discharge status: Home / Self Care | Payer: 59 | Attending: Emergency Medicine | Admitting: Emergency Medicine

## 2020-03-07 ENCOUNTER — Emergency Department (HOSPITAL_COMMUNITY): Payer: 59

## 2020-03-07 ENCOUNTER — Other Ambulatory Visit: Payer: Self-pay

## 2020-03-07 ENCOUNTER — Telehealth: Payer: Self-pay

## 2020-03-07 DIAGNOSIS — R0789 Other chest pain: Secondary | ICD-10-CM

## 2020-03-07 DIAGNOSIS — Z87891 Personal history of nicotine dependence: Secondary | ICD-10-CM | POA: Insufficient documentation

## 2020-03-07 DIAGNOSIS — R55 Syncope and collapse: Secondary | ICD-10-CM | POA: Diagnosis not present

## 2020-03-07 DIAGNOSIS — Z7982 Long term (current) use of aspirin: Secondary | ICD-10-CM | POA: Insufficient documentation

## 2020-03-07 DIAGNOSIS — R519 Headache, unspecified: Secondary | ICD-10-CM | POA: Diagnosis present

## 2020-03-07 DIAGNOSIS — Z809 Family history of malignant neoplasm, unspecified: Secondary | ICD-10-CM | POA: Insufficient documentation

## 2020-03-07 LAB — BASIC METABOLIC PANEL
Anion gap: 11 (ref 5–15)
BUN: 16 mg/dL (ref 8–23)
CO2: 22 mmol/L (ref 22–32)
Calcium: 9.6 mg/dL (ref 8.9–10.3)
Chloride: 104 mmol/L (ref 98–111)
Creatinine, Ser: 0.67 mg/dL (ref 0.44–1.00)
GFR calc Af Amer: >60 mL/min (ref >60)
GFR calc non Af Amer: >60 mL/min (ref >60)
Glucose, Bld: 92 mg/dL (ref 70–99)
Potassium: 3.7 mmol/L (ref 3.5–5.1)
Sodium: 137 mmol/L (ref 135–145)

## 2020-03-07 LAB — CBC
HCT: 42.7 % (ref 36.0–46.0)
Hemoglobin: 13.8 g/dL (ref 12.0–15.0)
MCH: 29.6 pg (ref 26.0–34.0)
MCHC: 32.3 g/dL (ref 30.0–36.0)
MCV: 91.6 fL (ref 80.0–100.0)
Platelets: 293 10*3/uL (ref 150–400)
RBC: 4.66 MIL/uL (ref 3.87–5.11)
RDW: 13 % (ref 11.5–15.5)
WBC: 4.6 10*3/uL (ref 4.0–10.5)
nRBC: 0 % (ref 0.0–0.2)

## 2020-03-07 LAB — TROPONIN I (HIGH SENSITIVITY)
Troponin I (High Sensitivity): <2 ng/L (ref <18)
Troponin I (High Sensitivity): 4 ng/L (ref <18)

## 2020-03-07 LAB — D-DIMER, QUANTITATIVE: D-Dimer, Quant: 1.71 ug/mL-FEU — ABNORMAL HIGH (ref 0.00–0.50)

## 2020-03-07 MED ORDER — ONDANSETRON 4 MG PO TBDP
4.0000 mg | ORAL_TABLET | Freq: Once | ORAL | Status: AC
  Administered 2020-03-08: 4 mg via ORAL
  Filled 2020-03-07: qty 1

## 2020-03-07 MED ORDER — IOHEXOL 350 MG/ML SOLN
75.0000 mL | Freq: Once | INTRAVENOUS | Status: AC | PRN
  Administered 2020-03-07: 75 mL via INTRAVENOUS

## 2020-03-07 MED ORDER — SODIUM CHLORIDE 0.9% FLUSH: 3.0000 mL | Freq: Once | INTRAVENOUS | Status: DC

## 2020-03-07 NOTE — ED Provider Notes (Signed)
[facility] Provider Note   CSN: [id number] Arrival date & time: 03/07/20  1315     History Chief Complaint  Patient presents with  . Chest Pain  . Headache    Kathy Dawson is a 64 y.o. female.  The history is provided by the patient, medical records and a relative. No language interpreter was used.  Chest Pain Associated symptoms: headache   Headache  Kathy Dawson is a 64 y.o. female who presents to the Emergency Department complaining of chest pain. She presents the emergency department accompanied by her daughter for evaluation of left sided chest pain that started around 10 o'clock today. She was visiting a friend, who recently had a family member passed away when she developed burning, left-sided chest pain that radiates to her shoulder and upper arm. She had associated lightheadedness and felt as if she might pass out. She had light transient diaphoresis and shortness of breath. Overall her symptoms are improving. She also had some nausea. She did have a mild transient headache as well. She was seen in the emergency department five days ago for similar but much more intense symptoms was evaluated at that time and discharged home. She felt unwell on Monday and Tuesday but felt well at her baseline Wednesday and Thursday. She has chronic lower extremity edema and this is unchanged from her baseline. She denies any fevers, vomiting. She has no history of hypertension, diabetes, hyperlipidemia, coronary artery disease, DVT. She does not take any medications.    Past Medical History:  Diagnosis Date  . Anemia    Hx  . Anemia, iron deficiency 06/09/2013  . Arthritis    knees, hips, elbow  . Complication of anesthesia 14 yrs. ago   woke up during the appendectomy  . GERD (gastroesophageal reflux disease)    otc med prn  . Knee pain     Patient Active Problem List   Diagnosis Date Noted  . Urinary, incontinence, stress female 02/02/2016    . 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 GERD (gastroesophageal reflux disease) 08/29/2014  . Tubular adenoma of colon 08/29/2014  . Dysphagia, pharyngoesophageal phase 08/29/2014  . Anemia, iron deficiency 06/09/2013  . Difficulty in walking(719.7) 02/05/2013  . Stiffness of right knee 02/05/2013  . Rectal bleeding 11/27/2011  . Constipation 11/27/2011  . DERANGEMENT MENISCUS 08/26/2008  . KNEE, ARTHRITIS, DEGEN./OSTEO 07/25/2008  . KNEE PAIN 07/25/2008    Past Surgical History:  Procedure Laterality Date  . ABDOMINAL HYSTERECTOMY    . ANTERIOR HIP REVISION Right 11/18/2014   Procedure: Right Total Hip Arthroplasty Femoral Revision-Anteior approach;  Surgeon: Marybelle Killings, MD;  Location: [facility];  Service: Orthopedics;  Laterality: Right;  . APPENDECTOMY    . bilateral foot surgery     hammer toes  . BLADDER SUSPENSION N/A 02/02/2016   Procedure: TRANSVAGINAL TAPE (TVT) Mid-Urethral Sling PROCEDURE;  Surgeon: Azucena Fallen, MD;  Location: [facility];  Service: Gynecology;  Laterality: N/A;  . COLONOSCOPY  12/08/2011   XBL:[id number] hemorrhoids/Sigmoid diverticulosis. Colonic polyp (tubular adenoma). Surveillance 2018  . COLONOSCOPY N/A 09/09/2017   Procedure: COLONOSCOPY;  Surgeon: Daneil Dolin, MD;  Location: [facility];  Service: Endoscopy;  Laterality: N/A;  2:15pm  . CYSTOSCOPY N/A 02/02/2016   Procedure: CYSTOSCOPY;  Surgeon: Azucena Fallen, MD;  Location: [facility];  Service: Gynecology;  Laterality: N/A;  . ESOPHAGOGASTRODUODENOSCOPY N/A 09/16/2014   Procedure: ESOPHAGOGASTRODUODENOSCOPY (EGD);  Surgeon:  Daneil Dolin, MD;  Location: [facility];  Service: Endoscopy;  Laterality: N/A;  1030am  . KNEE SURGERY Right 2011   arthroscopy  . MALONEY DILATION N/A 09/16/2014   Procedure: Venia Minks DILATION;  Surgeon: Daneil Dolin, MD;  Location: [facility];  Service: Endoscopy;  Laterality: N/A;  . 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  TOTAL HIP ARTHROPLASTY Right 11/04/2014   Procedure: TOTAL HIP ARTHROPLASTY ANTERIOR APPROACH, Possible Local Bonegrafting Acetabular Cyst;  Surgeon: Marybelle Killings, MD;  Location: [facility];  Service: Orthopedics;  Laterality: Right;  . TUBAL LIGATION    . VAGINAL HYSTERECTOMY N/A 02/02/2016   Procedure: HYSTERECTOMY VAGINAL;  Surgeon: Azucena Fallen, MD;  Location: [facility];  Service: Gynecology;  Laterality: N/A;     OB History    Gravida  4   Para  3   Term  3   Preterm      AB  1   Living        SAB  1   TAB      Ectopic      Multiple      Live Births              Family History  Problem Relation Age of Onset  . Colon polyps Father   . Cancer Father   . Heart disease Father   . Heart disease Other   . Colon cancer Neg Hx     Social History   Tobacco Use  . Smoking status: Former Smoker    Packs/day: 0.50    Years: 10.00    Pack years: 5.00    Types: Cigarettes  . Smokeless tobacco: Never Used  Vaping Use  . Vaping Use: Never used  Substance Use Topics  . Alcohol use: No    Alcohol/week: 0.0 standard drinks  . Drug use: No    Home Medications Prior to Admission medications   Medication Sig Start Date End Date Taking? Authorizing Provider  acetaminophen (TYLENOL) 500 MG tablet Take 1,000 mg by mouth every 6 (six) hours as needed for moderate pain or headache.     [provider]  aspirin 81 MG chewable tablet Chew 324 mg by mouth once.    [provider]  Ca Carbonate-Mag Hydroxide (ROLAIDS) 550-110 MG CHEW Chew 1 tablet by mouth daily as needed (for indigestion).    [provider]  omeprazole (PRILOSEC OTC) 20 MG tablet Take 20 mg by mouth daily as needed (for heartburn).    [provider]    Allergies    Clindamycin/lincomycin and Prochlorperazine  Review of Systems   Review of Systems  Cardiovascular: Positive for chest pain.  Neurological: Positive for headaches.    Physical Exam Updated Vital Signs BP  (!) 137/71   Pulse 67   Temp 98.4 F (36.9 C) (Oral)   Resp (!) 24   Ht 5\' 5"  (1.651 m)   Wt (!) 97.5 kg   SpO2 98%   BMI 35.78 kg/m   Physical Exam Vitals and nursing note reviewed.  Constitutional:      Appearance: She is well-developed.  HENT:     Head: Normocephalic and atraumatic.  Cardiovascular:     Rate and Rhythm: Normal rate and regular rhythm.     Heart sounds: No murmur heard.   Pulmonary:     Effort: Pulmonary effort is normal. No respiratory distress.     Breath sounds: Normal breath sounds.  Abdominal:     Palpations:  Abdomen is soft.     Tenderness: There is no abdominal tenderness. There is no guarding or rebound.  Musculoskeletal:        General: No tenderness.     Comments: No significant edema to bilateral lower extremities. There is edema to the right upper extremity.  Skin:    General: Skin is warm and dry.  Neurological:     Mental Status: She is alert and oriented to person, place, and time.  Psychiatric:        Behavior: Behavior normal.     ED Results / Procedures / Treatments   Labs (all labs ordered are listed, but only abnormal results are displayed) Labs Reviewed  D-DIMER, QUANTITATIVE (NOT AT [facility]) - Abnormal; Notable for the following components:      Result Value   D-Dimer, Quant 1.71 (*)    All other components within normal limits  BASIC METABOLIC PANEL  CBC  TROPONIN I (HIGH SENSITIVITY)  TROPONIN I (HIGH SENSITIVITY)    EKG EKG Interpretation  Date/Time:  Friday March 07 2020 13:36:06 EDT Ventricular Rate:  68 PR Interval:  132 QRS Duration: 76 QT Interval:  412 QTC Calculation: 438 R Axis:   -53 Text Interpretation: Normal sinus rhythm with sinus arrhythmia Left anterior fascicular block Abnormal ECG Confirmed by Quintella Reichert [phone]) on 03/07/2020 4:28:33 PM   Radiology DG Chest 2 View  Result Date: 03/07/2020 CLINICAL DATA:  Shortness of breath, chest pain EXAM: CHEST - 2 VIEW COMPARISON:  03/02/2020  FINDINGS: Stable cardiomediastinal contours. No focal airspace consolidation, pleural effusion, or pneumothorax. No acute osseous findings. IMPRESSION: No active cardiopulmonary disease. Electronically Signed   By: Davina Poke D.O.   On: 03/07/2020 14:21   CT Head Wo Contrast  Result Date: 03/07/2020 CLINICAL DATA:  Dizziness, presyncope, visual disturbance, headache EXAM: CT HEAD WITHOUT CONTRAST TECHNIQUE: Contiguous axial images were obtained from the base of the skull through the vertex without intravenous contrast. COMPARISON:  None. FINDINGS: Brain: Normal anatomic configuration. No abnormal intra or extra-axial mass lesion or fluid collection. No abnormal mass effect or midline shift. No evidence of acute intracranial hemorrhage or infarct. Ventricular size is normal. Cerebellum unremarkable. Vascular: Unremarkable Skull: Intact Sinuses/Orbits: Mild mucosal thickening within the right sphenoid sinus. Remaining paranasal sinuses are clear. Orbits are unremarkable. Other: Mastoid air cells and middle ear cavities are clear. IMPRESSION: Minimal sphenoid sinus disease.  Otherwise unremarkable examination. Electronically Signed   By: Fidela Salisbury MD   On: 03/07/2020 22:35   CT Angio Chest PE W/Cm &/Or Wo Cm  Result Date: 03/07/2020 CLINICAL DATA:  Positive D-dimer, burning sensation left chest, left shoulder pain EXAM: CT ANGIOGRAPHY CHEST WITH CONTRAST TECHNIQUE: Multidetector CT imaging of the chest was performed using the standard protocol during bolus administration of intravenous contrast. Multiplanar CT image reconstructions and MIPs were obtained to evaluate the vascular anatomy. CONTRAST:  58mL OMNIPAQUE IOHEXOL 350 MG/ML SOLN COMPARISON:  03/07/2020 FINDINGS: Cardiovascular: This is a technically adequate evaluation of the pulmonary vasculature. No filling defects or pulmonary emboli. The heart is unremarkable without pericardial effusion. Normal caliber of the thoracic aorta.  Mediastinum/Nodes: No enlarged mediastinal, hilar, or axillary lymph nodes. Thyroid gland, trachea, and esophagus demonstrate no significant findings. Lungs/Pleura: No airspace disease, effusion, or pneumothorax. The central airways are patent. Upper Abdomen: No acute abnormality. Musculoskeletal: No acute or destructive bony lesions. Reconstructed images demonstrate no additional findings. Review of the MIP images confirms the above findings. IMPRESSION: 1. No evidence of pulmonary embolus. 2. No  acute intrathoracic process. Electronically Signed   By: Randa Ngo M.D.   On: 03/07/2020 22:36    Procedures Procedures (including critical care time)  Medications Ordered in ED Medications  sodium chloride flush (NS) 0.9 % injection 3 mL (has no administration in time range)  ondansetron (ZOFRAN-ODT) disintegrating tablet 4 mg (has no administration in time range)  iohexol (OMNIPAQUE) 350 MG/ML injection 75 mL (75 mLs Intravenous Contrast Given 03/07/20 2227)    ED Course  I have reviewed the triage vital signs and the nursing notes.  Pertinent labs & imaging results that were available during my care of the patient were reviewed by me and considered in my medical decision making (see chart for details).    MDM Rules/Calculators/A&P                         Patient here for evaluation of chest pain, near syncopal episode. She is non-toxic appearing on evaluation with no focal neurologic deficits. EKG without acute ischemic changes in troponin negative times two. Presentation is not consistent with CVA, ACS, dissection. She does have right upper extremity edema and a D dimer was obtained, which was elevated. A CTA was obtained, which is negative for PE. Patient's pain is improved on reassessment. Feel that she is safe for discharge home with outpatient DVT study. In terms of her recurrent chest pain recommend cardiology follow-up, source of pain is unclear at this time. She does have a pulmonary  appointment on Monday. Discussed importance of outpatient follow-up, return precautions.  Final Clinical Impression(s) / ED Diagnoses Final diagnoses:  Atypical chest pain  Near syncope    Rx / DC Orders ED Discharge Orders         Ordered    UE VENOUS DUPLEX        03/07/20 2313    Ambulatory referral to Cardiology     Discontinue  Reprint     03/07/20 2314           Quintella Reichert, MD 03/07/20 2343

## 2020-03-07 NOTE — ED Notes (Signed)
Laying  B/P  125/66  P.  64 Sitting   B/P  130/79  P   66 Standing B/P  137/81  P  74

## 2020-03-07 NOTE — ED Notes (Signed)
Patient given sprite with ice

## 2020-03-07 NOTE — Telephone Encounter (Signed)
Patient daughter called stated that moms bp is 170/100 and wanted to know if she needed to go to the hospital. I did advise to go to the hospital. Nothing further is needed.

## 2020-03-07 NOTE — ED Triage Notes (Signed)
C/O burning sensation in left chest area and some visual disturbance. Patient c/o headache as well. Patient stated she was seen last Sunday in ED for chest pain as well.

## 2020-03-07 NOTE — ED Notes (Signed)
Pt to CT at this time.

## 2020-03-08 ENCOUNTER — Ambulatory Visit (HOSPITAL_COMMUNITY): Admission: RE | Admit: 2020-03-08 | Payer: 59 | Source: Ambulatory Visit

## 2020-03-10 ENCOUNTER — Encounter: Payer: Self-pay | Admitting: Pulmonary Disease

## 2020-03-10 ENCOUNTER — Ambulatory Visit (INDEPENDENT_AMBULATORY_CARE_PROVIDER_SITE_OTHER): Payer: 59 | Admitting: Pulmonary Disease

## 2020-03-10 ENCOUNTER — Other Ambulatory Visit: Payer: Self-pay

## 2020-03-10 VITALS — BP 118/62 | HR 76 | Temp 98.2°F | Ht 65.0 in | Wt 211.8 lb

## 2020-03-10 DIAGNOSIS — R0989 Other specified symptoms and signs involving the circulatory and respiratory systems: Secondary | ICD-10-CM | POA: Diagnosis not present

## 2020-03-10 DIAGNOSIS — R55 Syncope and collapse: Secondary | ICD-10-CM

## 2020-03-10 DIAGNOSIS — R0600 Dyspnea, unspecified: Secondary | ICD-10-CM

## 2020-03-10 DIAGNOSIS — R42 Dizziness and giddiness: Secondary | ICD-10-CM

## 2020-03-10 DIAGNOSIS — R0609 Other forms of dyspnea: Secondary | ICD-10-CM

## 2020-03-10 NOTE — Progress Notes (Signed)
c 

## 2020-03-10 NOTE — Progress Notes (Signed)
Patient ID: Kathy Dawson, female    DOB: [birth year], 64 y.o.   MRN: [id number]  Chief Complaint  Patient presents with  . Consult    feeling like going to pass out, chest burning, can't focus, shivering, 2 episodes , extreme fatigue     Referring provider: No ref. 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   Patient was in usual state of health when 2-3 weeks ago at  church she developed onset of severe chest burning that radiated to left shoulder. She felt light headed. Denies vertigo. She felt as if she was going to pass out. Her husband drove her home and EMS arrived. Symptoms present at least 30 minutes, likely longer and EMS was at the house 20-30 minutes. She was placed on oxygen which helped some. EMS left without transporting to hospital. She notes her BP was high, SBP 916B, diastolic 846K. Typically, she reports normal BPs. She endorses intermittent similar symptoms but much less severe. These symptoms are also associated with shortness of breath and headache.  Symptoms present for 2-3 weeks. Episodes last several minutes. There seem to be no exacerbating or alleviating factors. Symptoms resolve on their own, spontaneously. There is no time of day or event during the day that triggers the symptoms. All occur at rest.   Severe symptoms led to ED visit 03/02/20. ED visit note reviewed. O2 sat 98%. BP 102/71. CXR was performed which revealed by my interpretation, mild vascular congestion, no overt pulmonary edema, bilateral atelectasis, no pleural effusion. Labs notable for normal troponin x 2. Hgb WNL. Discharged home as symptoms resolved without intervention. Severe symptoms 03/07/20 also prompted ED visit. ED visit note reviewed. They noted chronic LE edema. D-Dimer was elevated prompting CTA chest that by my  interpretation showed no PE, atelectasis in lingula, otherwise clear parenchyma, no pleural effusion. Hgb stable. Troponins negative again.   PMH:  Past medical history: arthritis (steroid shots for a long time, no longer), knee joint infection following replacement Past Surgical History: R knee replacement, washouts, R hip surgery Social history: former smoker, quit 1995, 20 pack year, denies alcohol use, married - Husband former farmer now [employer] cabin [employer] Family history: Father heart attack in 61s, mother heart disease   Imaging: Personally reviewed and as per discussion/interpretation in this note DG Chest 2 View  Result Date: 03/07/2020 CLINICAL DATA:  Shortness of breath, chest pain EXAM: CHEST - 2 VIEW COMPARISON:  03/02/2020 FINDINGS: Stable cardiomediastinal contours. No focal airspace consolidation, pleural effusion, or pneumothorax. No acute osseous findings. IMPRESSION: No active cardiopulmonary disease. Electronically Signed   By: Davina Poke D.O.   On: 03/07/2020 14:21   DG Chest 2 View  Result Date: 03/02/2020 CLINICAL DATA:  Chest pain EXAM: CHEST - 2 VIEW COMPARISON:  None. FINDINGS: The heart size and mediastinal contours are within normal limits. Mild prominence of the central pulmonary vasculature. Subsegmental atelectasis seen at both lung bases. No large airspace consolidation or pleural effusion. Aortic knob calcifications are seen. No acute osseous abnormality. IMPRESSION: Mild pulmonary vascular congestion. Electronically Signed   By: Prudencio Pair M.D.   On: 03/02/2020 16:02   CT Head Wo Contrast  Result Date: 03/07/2020 CLINICAL DATA:  Dizziness, presyncope, visual disturbance, headache EXAM: CT HEAD WITHOUT CONTRAST TECHNIQUE: Contiguous axial images were obtained from the base  of the skull through the vertex without intravenous contrast. COMPARISON:  None. FINDINGS: Brain: Normal anatomic configuration. No abnormal intra or extra-axial mass lesion or fluid  collection. No abnormal mass effect or midline shift. No evidence of acute intracranial hemorrhage or infarct. Ventricular size is normal. Cerebellum unremarkable. Vascular: Unremarkable Skull: Intact Sinuses/Orbits: Mild mucosal thickening within the right sphenoid sinus. Remaining paranasal sinuses are clear. Orbits are unremarkable. Other: Mastoid air cells and middle ear cavities are clear. IMPRESSION: Minimal sphenoid sinus disease.  Otherwise unremarkable examination. Electronically Signed   By: Fidela Salisbury MD   On: 03/07/2020 22:35   CT Angio Chest PE W/Cm &/Or Wo Cm  Result Date: 03/07/2020 CLINICAL DATA:  Positive D-dimer, burning sensation left chest, left shoulder pain EXAM: CT ANGIOGRAPHY CHEST WITH CONTRAST TECHNIQUE: Multidetector CT imaging of the chest was performed using the standard protocol during bolus administration of intravenous contrast. Multiplanar CT image reconstructions and MIPs were obtained to evaluate the vascular anatomy. CONTRAST:  86mL OMNIPAQUE IOHEXOL 350 MG/ML SOLN COMPARISON:  03/07/2020 FINDINGS: Cardiovascular: This is a technically adequate evaluation of the pulmonary vasculature. No filling defects or pulmonary emboli. The heart is unremarkable without pericardial effusion. Normal caliber of the thoracic aorta. Mediastinum/Nodes: No enlarged mediastinal, hilar, or axillary lymph nodes. Thyroid gland, trachea, and esophagus demonstrate no significant findings. Lungs/Pleura: No airspace disease, effusion, or pneumothorax. The central airways are patent. Upper Abdomen: No acute abnormality. Musculoskeletal: No acute or destructive bony lesions. Reconstructed images demonstrate no additional findings. Review of the MIP images confirms the above findings. IMPRESSION: 1. No evidence of pulmonary embolus. 2. No acute intrathoracic process. Electronically Signed   By: Randa Ngo M.D.   On: 03/07/2020 22:36    Lab Results: Personally reviewed, normal Hgb CBC      Component Value Date/Time   WBC 4.6 03/07/2020 1337   RBC 4.66 03/07/2020 1337   HGB 13.8 03/07/2020 1337   HCT 42.7 03/07/2020 1337   PLT 293 03/07/2020 1337   MCV 91.6 03/07/2020 1337   MCH 29.6 03/07/2020 1337   MCHC 32.3 03/07/2020 1337   RDW 13.0 03/07/2020 1337   LYMPHSABS 3.1 06/10/2017 0928   MONOABS 0.5 06/10/2017 0928   EOSABS 0.2 06/10/2017 0928   BASOSABS 0.0 06/10/2017 0928    BMET    Component Value Date/Time   NA 137 03/07/2020 1337   K 3.7 03/07/2020 1337   CL 104 03/07/2020 1337   CO2 22 03/07/2020 1337   GLUCOSE 92 03/07/2020 1337   BUN 16 03/07/2020 1337   CREATININE 0.67 03/07/2020 1337   CALCIUM 9.6 03/07/2020 1337   GFRNONAA >60 03/07/2020 1337   GFRAA >60 03/07/2020 1337    BNP    Component Value Date/Time   BNP 47.0 07/04/2016 1432    ProBNP No results found for: PROBNP  Specialty Problems    None      Allergies  Allergen Reactions  . Clindamycin/Lincomycin Hives and Itching  . Prochlorperazine Other (See Comments)    Seizure like symptoms     There is no immunization history on file for this patient.  Past Medical History:  Diagnosis Date  . Anemia    Hx  . Anemia, iron deficiency 06/09/2013  . Arthritis    knees, hips, elbow  . Complication of anesthesia 14 yrs. ago   woke up during the appendectomy  . GERD (gastroesophageal reflux disease)    otc med prn  . Knee pain     Tobacco History: Social History  Tobacco Use  Smoking Status Former Smoker  . Packs/day: 1.00  . Years: 20.00  . Pack years: 20.00  . Types: Cigarettes  . Start date: 4  . Quit date: 60  . Years since quitting: 26.5  Smokeless Tobacco Never Used   Counseling given: Not Answered   Continue to not smoke  Outpatient Encounter Medications as of 03/10/2020  Medication Sig  . acetaminophen (TYLENOL) 500 MG tablet Take 1,000 mg by mouth every 6 (six) hours as needed for moderate pain or headache.   . Ca Carbonate-Mag Hydroxide  (ROLAIDS) 550-110 MG CHEW Chew 1 tablet by mouth daily as needed (for indigestion).  Marland Kitchen OVER THE COUNTER MEDICATION Hemp oil home grown  . aspirin 81 MG chewable tablet Chew 324 mg by mouth once. (Patient not taking: Reported on 03/10/2020)  . [DISCONTINUED] omeprazole (PRILOSEC OTC) 20 MG tablet Take 20 mg by mouth daily as needed (for heartburn).   No facility-administered encounter medications on file as of 03/10/2020.     Review of Systems  Chronic LE and UE swelling largely unchanged to slightly improved today, no orthopnea or PND, comprehensive review of systems otherwise negative.  Physical Exam  BP (!) 118/62 (BP Location: Right Arm, Cuff Size: Normal)   Pulse 76   Temp 98.2 F (36.8 C) (Oral)   Ht 5\' 5"  (1.651 m)   Wt (!) 211 lb 12.8 oz (96.1 kg)   SpO2 99%   BMI 35.25 kg/m   Wt Readings from Last 5 Encounters:  03/10/20 (!) 211 lb 12.8 oz (96.1 kg)  03/07/20 (!) 215 lb (97.5 kg)  03/02/20 215 lb (97.5 kg)  09/09/17 208 lb (94.3 kg)  08/22/17 211 lb 6.4 oz (95.9 kg)    BMI Readings from Last 5 Encounters:  03/10/20 35.25 kg/m  03/07/20 35.78 kg/m  03/02/20 35.78 kg/m  09/09/17 34.61 kg/m  08/22/17 35.18 kg/m     Physical Exam General: well appearing, in NAD Eyes: EOMI, no icterus Mouth: No lesions, no erythema of OP Pulmonary: CTAB, no wheezes Cardiovascular: RRR, no murmurs Abdomen/GI: Non tender, non distended, BS present Musculoskeletal: enlarged forearms and elbow without pitting edema or obvious effusion, LE with no pitting edema Neuro: CN intact, normal gait Psych: Normal mood, full affect   Assessment & Plan:   Kathy Dawson is a 64 year old with 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   Symptoms are a bit vague. Occur at rest. Pre-syncope and chest pain without exertion does not fit with pulmonary issue. Reassuringly,  CTA chest 03/07/2020 without infiltrate or PE to explain symptoms. Does not appear she was orthostatic at ED evaluations. Possible cardiac cause of symptoms but lack of exertional component is not the best fit. Only other objective finding is UE and LE swelling that comes and goes. Diagnosed with venous insufficiency in the past, has reason for assymetric RLE swelling given extensive orthopedic work there,  and swelling stable to improved. Multiple normal PVLs in past. Possible cardiac cause of intermittent swelling combined with mild pulmonary congestion on CXR 03/02/20 warrants further evaluation with TTE, which was ordered. Also, given intermittent SOB and prior smoking history, PFTs ordered. Given chest pain and pre-syncope, referral to cardiology made.   Return in about 3 months (around 06/10/2020).   Larey Days 03/10/2020   This appointment required 89 minutes of patient care (this includes precharting, chart review, review  of results, face-to-face care, etc.).

## 2020-03-10 NOTE — Patient Instructions (Addendum)
Nice to meet you!  Your CT scan most recently looks good - no evidence of fluid in the lung and no blood clot. This is very reassuring!  We will get breathing tests to make sure we are not missing anything from the lungs that could be contributing especially given that you are having some episodes of shortness of breath.   I am referring you to a cardiologist given the initial chest xray questioned some extra fluid in the veins and given the episodes of nearly passing out. I ordered a heart ultrasound to further evaluate this.  Follow up in 3 months with PFTs. We will need to schedule a COVID test prior to the breathing tests. Due to not being fully vaccinated

## 2020-03-20 ENCOUNTER — Other Ambulatory Visit: Payer: Self-pay

## 2020-03-20 ENCOUNTER — Ambulatory Visit
Admission: EM | Admit: 2020-03-20 | Discharge: 2020-03-20 | Disposition: A | Discharge status: Home / Self Care | Payer: 59 | Attending: Emergency Medicine | Admitting: Emergency Medicine

## 2020-03-20 DIAGNOSIS — J069 Acute upper respiratory infection, unspecified: Secondary | ICD-10-CM | POA: Diagnosis not present

## 2020-03-20 DIAGNOSIS — Z1152 Encounter for screening for COVID-19: Secondary | ICD-10-CM | POA: Diagnosis not present

## 2020-03-20 DIAGNOSIS — J029 Acute pharyngitis, unspecified: Secondary | ICD-10-CM

## 2020-03-20 LAB — POCT RAPID STREP A (OFFICE): Rapid Strep A Screen: NEGATIVE

## 2020-03-20 MED ORDER — PREDNISONE 10 MG PO TABS
20.0000 mg | ORAL_TABLET | Freq: Every day | ORAL | 0 refills | Status: AC
Start: 2020-03-20 — End: 2020-03-25

## 2020-03-20 MED ORDER — CEPACOL REGULAR STRENGTH 3 MG MT LOZG: 1.0000 | LOZENGE | OROMUCOSAL | 2 refills | Status: DC | PRN

## 2020-03-20 MED ORDER — BENZONATATE 100 MG PO CAPS
100.0000 mg | ORAL_CAPSULE | Freq: Three times a day (TID) | ORAL | 0 refills | Status: DC
Start: 2020-03-20 — End: 2020-05-08

## 2020-03-20 MED ORDER — FLUTICASONE PROPIONATE 50 MCG/ACT NA SUSP
1.0000 | Freq: Every day | NASAL | 0 refills | Status: DC
Start: 2020-03-20 — End: 2020-05-08

## 2020-03-20 NOTE — ED Provider Notes (Signed)
[facility]   [id number] 03/20/20 Arrival Time: 7412   CC: COVID symptoms  SUBJECTIVE: History from: patient.  Kathy Dawson is a 64 y.o. female who presents to the urgent care for complaint of sore throat, cough, sinus pressure and postnasal drip for the past few days.  Denies sick exposure to COVID, flu or strep.  Denies recent travel.  Has not tried any OTC medication.  Denies aggravating factors.  Denies previous symptoms in the past.   Denies fever, chills, fatigue, sinus pain, rhinorrhea,  SOB, wheezing, chest pain, nausea, changes in bowel or bladder habits.     ROS: As per HPI.  All other pertinent ROS negative.      Past Medical History:  Diagnosis Date  . Anemia    Hx  . Anemia, iron deficiency 06/09/2013  . Arthritis    knees, hips, elbow  . Complication of anesthesia 14 yrs. ago   woke up during the appendectomy  . GERD (gastroesophageal reflux disease)    otc med prn  . Knee pain    Past Surgical History:  Procedure Laterality Date  . ABDOMINAL HYSTERECTOMY    . ANTERIOR HIP REVISION Right 11/18/2014   Procedure: Right Total Hip Arthroplasty Femoral Revision-Anteior approach;  Surgeon: Marybelle Killings, MD;  Location: [facility];  Service: Orthopedics;  Laterality: Right;  . APPENDECTOMY    . bilateral foot surgery     hammer toes  . BLADDER SUSPENSION N/A 02/02/2016   Procedure: TRANSVAGINAL TAPE (TVT) Mid-Urethral Sling PROCEDURE;  Surgeon: Azucena Fallen, MD;  Location: [facility];  Service: Gynecology;  Laterality: N/A;  . COLONOSCOPY  12/08/2011   INO:[id number] hemorrhoids/Sigmoid diverticulosis. Colonic polyp (tubular adenoma). Surveillance 2018  . COLONOSCOPY N/A 09/09/2017   Procedure: COLONOSCOPY;  Surgeon: Daneil Dolin, MD;  [facility];  Service: Endoscopy;  Laterality: N/A;  2:15pm  . CYSTOSCOPY N/A 02/02/2016   Procedure: CYSTOSCOPY;  Surgeon: Azucena Fallen, MD;  Location: [facility];  Service: Gynecology;  Laterality: N/A;  .  ESOPHAGOGASTRODUODENOSCOPY N/A 09/16/2014   Procedure: ESOPHAGOGASTRODUODENOSCOPY (EGD);  Surgeon: Daneil Dolin, MD;  [facility];  Service: Endoscopy;  Laterality: N/A;  1030am  . KNEE SURGERY Right 2011   arthroscopy  . MALONEY DILATION N/A 09/16/2014   Procedure: Venia Minks DILATION;  Surgeon: Daneil Dolin, MD;  [facility];  Service: Endoscopy;  Laterality: N/A;  . 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 TOTAL HIP ARTHROPLASTY Right 11/04/2014   Procedure: TOTAL HIP ARTHROPLASTY ANTERIOR APPROACH, Possible Local Bonegrafting Acetabular Cyst;  Surgeon: Marybelle Killings, MD;  Location: [facility];  Service: Orthopedics;  Laterality: Right;  . TUBAL LIGATION    . VAGINAL HYSTERECTOMY N/A 02/02/2016   Procedure: HYSTERECTOMY VAGINAL;  Surgeon: Azucena Fallen, MD;  Location: [facility];  Service: Gynecology;  Laterality: N/A;   Allergies  Allergen Reactions  . Clindamycin/Lincomycin Hives and Itching  . Prochlorperazine Other (See Comments)    Seizure like symptoms   No current facility-administered medications on file prior to encounter.   Current Outpatient Medications on File Prior to Encounter  Medication Sig Dispense Refill  . acetaminophen (TYLENOL) 500 MG tablet Take 1,000 mg by mouth every 6 (six) hours as needed for moderate pain or headache.     Marland Kitchen aspirin 81 MG chewable tablet Chew 324 mg by mouth once. (Patient not taking: Reported on 03/10/2020)    . Ca Carbonate-Mag Hydroxide (ROLAIDS) 550-110 MG CHEW Chew 1 tablet by mouth daily as needed (  for indigestion).    Marland Kitchen OVER THE COUNTER MEDICATION Hemp oil home grown     Social History   Socioeconomic History  . Marital status: Married    Spouse name: Not on file  . Number of children: Not on file  . Years of education: Not on file  . Highest education level: Not on file  Occupational History  . Occupation: tobacco farmer  Tobacco Use  . Smoking status: Former Smoker    Packs/day: 1.00    Years: 20.00    Pack  years: 20.00    Types: Cigarettes    Start date: 89    Quit date: 1995    Years since quitting: 26.6  . Smokeless tobacco: Never Used  Vaping Use  . Vaping Use: Never used  Substance and Sexual Activity  . Alcohol use: No    Alcohol/week: 0.0 standard drinks  . Drug use: No  . Sexual activity: Not Currently    Birth control/protection: None  Other Topics Concern  . Not on file  Social History Narrative  . Not on file   Social Determinants of Health   Financial Resource Strain:   . Difficulty of Paying Living Expenses:   Food Insecurity:   . Worried About Charity fundraiser in the Last Year:   . Arboriculturist in the Last Year:   Transportation Needs:   . Film/video editor (Medical):   Marland Kitchen Lack of Transportation (Non-Medical):   Physical Activity:   . Days of Exercise per Week:   . Minutes of Exercise per Session:   Stress:   . Feeling of Stress :   Social Connections:   . Frequency of Communication with Friends and Family:   . Frequency of Social Gatherings with Friends and Family:   . Attends Religious Services:   . Active Member of Clubs or Organizations:   . Attends Archivist Meetings:   Marland Kitchen Marital Status:   Intimate Partner Violence:   . Fear of Current or Ex-Partner:   . Emotionally Abused:   Marland Kitchen Physically Abused:   . Sexually Abused:    Family History  Problem Relation Age of Onset  . Colon polyps Father   . Cancer Father   . Heart disease Father   . Heart disease Other   . Colon cancer Neg Hx     OBJECTIVE:  Vitals:   03/20/20 0835  BP: 128/80  Pulse: 80  Resp: 16  Temp: 98.4 F (36.9 C)  TempSrc: Oral  SpO2: 97%     General appearance: alert; appears fatigued, but nontoxic; speaking in full sentences and tolerating own secretions HEENT: NCAT; Ears: EACs clear, TMs pearly gray; Eyes: PERRL.  EOM grossly intact. Sinuses: nontender; Nose: nares patent without rhinorrhea, Throat: oropharynx clear, tonsils non erythematous or  enlarged, uvula midline  Neck: supple without LAD Lungs: unlabored respirations, symmetrical air entry; cough: mild; no respiratory distress; CTAB Heart: regular rate and rhythm.  Radial pulses 2+ symmetrical bilaterally Skin: warm and dry Psychological: alert and cooperative; normal mood and affect  LABS:  No results found for this or any previous visit (from the past 24 hour(s)).   ASSESSMENT & PLAN:  1. Sore throat   2. Encounter for screening for COVID-19   3. URI with cough and congestion     Meds ordered this encounter  Medications  . menthol-cetylpyridinium (CEPACOL REGULAR STRENGTH) 3 MG lozenge    Sig: Take 1 lozenge (3 mg total) by mouth as needed  for sore throat.    Dispense:  100 tablet    Refill:  2  . benzonatate (TESSALON) 100 MG capsule    Sig: Take 1 capsule (100 mg total) by mouth every 8 (eight) hours.    Dispense:  30 capsule    Refill:  0  . fluticasone (FLONASE) 50 MCG/ACT nasal spray    Sig: Place 1 spray into both nostrils daily for 14 days.    Dispense:  16 g    Refill:  0  . predniSONE (DELTASONE) 10 MG tablet    Sig: Take 2 tablets (20 mg total) by mouth daily for 5 days.    Dispense:  10 tablet    Refill:  0    Discharge Instructions.   POCT strep test is negative/sample will be sent for culture and someone will call if your result is abnormal COVID testing ordered.  It will take between 2-7 days for test results.  Someone will contact you regarding abnormal results.    In the meantime: You should remain isolated in your home for 10 days from symptom onset AND greater than 24 hours after symptoms resolution (absence of fever without the use of fever-reducing medication and improvement in respiratory symptoms), whichever is longer Get plenty of rest and push fluids Tessalon Perles prescribed for cough Prescribed low-dose prednisone Prescribed flonase for nasal congestion and runny nose Prescribed Cepacol lozenges for sore throat Use  medications daily for symptom relief Use OTC medications like ibuprofen or tylenol as needed fever or pain Call or go to the ED if you have any new or worsening symptoms such as fever, worsening cough, shortness of breath, chest tightness, chest pain, turning blue, changes in mental status, etc...   Reviewed expectations re: course of current medical issues. Questions answered. Outlined signs and symptoms indicating need for more acute intervention. Patient verbalized understanding. After Visit Summary given.      Note: This document was prepared using Dragon voice recognition software and may include unintentional dictation errors.    Emerson Monte, [place] 03/20/20 [phone]

## 2020-03-20 NOTE — Discharge Instructions (Addendum)
POCT strep test is negative/sample will be sent for culture and someone will call if your result is abnormal COVID testing ordered.  It will take between 2-7 days for test results.  Someone will contact you regarding abnormal results.    In the meantime: You should remain isolated in your home for 10 days from symptom onset AND greater than 24 hours after symptoms resolution (absence of fever without the use of fever-reducing medication and improvement in respiratory symptoms), whichever is longer Get plenty of rest and push fluids Tessalon Perles prescribed for cough Prescribed low-dose prednisone Prescribed flonase for nasal congestion and runny nose Use medications daily for symptom relief Use OTC medications like ibuprofen or tylenol as needed fever or pain Call or go to the ED if you have any new or worsening symptoms such as fever, worsening cough, shortness of breath, chest tightness, chest pain, turning blue, changes in mental status, etc..Marland Kitchen

## 2020-03-22 LAB — SARS-COV-2, NAA 2 DAY TAT

## 2020-03-22 LAB — NOVEL CORONAVIRUS, NAA: SARS-CoV-2, NAA: NOT DETECTED

## 2020-03-23 LAB — CULTURE, GROUP A STREP (THRC)

## 2020-03-31 ENCOUNTER — Telehealth: Payer: Self-pay | Admitting: Pulmonary Disease

## 2020-03-31 NOTE — Telephone Encounter (Signed)
Called and left message that referral was placed but she could schedule appointment when she is ready. Patient ask to call us back if she has any further questions.

## 2020-04-04 ENCOUNTER — Other Ambulatory Visit: Payer: Self-pay

## 2020-04-04 ENCOUNTER — Ambulatory Visit (HOSPITAL_COMMUNITY): Payer: 59 | Attending: Cardiology

## 2020-04-04 DIAGNOSIS — R0600 Dyspnea, unspecified: Secondary | ICD-10-CM | POA: Diagnosis not present

## 2020-04-04 DIAGNOSIS — R0989 Other specified symptoms and signs involving the circulatory and respiratory systems: Secondary | ICD-10-CM

## 2020-04-04 LAB — ECHOCARDIOGRAM COMPLETE
Area-P 1/2: 4.99 cm2
S' Lateral: 2.2 cm

## 2020-04-08 ENCOUNTER — Telehealth: Payer: Self-pay | Admitting: Pulmonary Disease

## 2020-04-08 DIAGNOSIS — R42 Dizziness and giddiness: Secondary | ICD-10-CM

## 2020-04-08 DIAGNOSIS — R0609 Other forms of dyspnea: Secondary | ICD-10-CM

## 2020-04-08 DIAGNOSIS — R0989 Other specified symptoms and signs involving the circulatory and respiratory systems: Secondary | ICD-10-CM

## 2020-04-08 NOTE — Telephone Encounter (Signed)
Referral for cardiology was placed at pt's last OV 03/10/20.   Referral was sent to [facility] in [place].  On 8/12, pt was called to get an appt scheduled but stated that she was wanting referral to see a different physician.  Called and spoke with pt letting her know that we were going to place the new referral for her to see either Dr. Irish Lack or Dr. Cathie Olden with cardiology at her request and she verbalized understanding. New cardiology referral specifying those MDs has been placed.  Pt stated that she did have another scare overnight 8/23 and stated that she also checked her BP and BP was 150/100.  Pt also stated that she had the pain in her shoulders again with this episode like she had with the previous one. Pt stated that this episode did not happen as long as the first that she had prior to her appt with Dr. Silas Flood. This would be the third episode that she has had.  While speaking with pt, she wanted to know if the results of the echo have come back. Dr. Garen Grams, please advise.

## 2020-04-08 NOTE — Telephone Encounter (Signed)
Called and spoke with pt letting her know the results of echo stated by Dr. Silas Flood and she verbalized understanding. Nothing further needed.

## 2020-04-17 ENCOUNTER — Institutional Professional Consult (permissible substitution): Payer: 59 | Admitting: Pulmonary Disease

## 2020-05-07 ENCOUNTER — Encounter: Payer: Self-pay | Admitting: Emergency Medicine

## 2020-05-07 ENCOUNTER — Ambulatory Visit
Admission: EM | Admit: 2020-05-07 | Discharge: 2020-05-07 | Disposition: A | Discharge status: Home / Self Care | Payer: 59 | Attending: Emergency Medicine | Admitting: Emergency Medicine

## 2020-05-07 ENCOUNTER — Other Ambulatory Visit: Payer: Self-pay

## 2020-05-07 DIAGNOSIS — R350 Frequency of micturition: Secondary | ICD-10-CM | POA: Insufficient documentation

## 2020-05-07 DIAGNOSIS — R3989 Other symptoms and signs involving the genitourinary system: Secondary | ICD-10-CM | POA: Diagnosis present

## 2020-05-07 LAB — POCT URINALYSIS DIP (MANUAL ENTRY)
Bilirubin, UA: NEGATIVE
Blood, UA: NEGATIVE
Glucose, UA: NEGATIVE mg/dL
Ketones, POC UA: NEGATIVE mg/dL
Nitrite, UA: NEGATIVE
Protein Ur, POC: NEGATIVE mg/dL
Spec Grav, UA: 1.025 (ref 1.010–1.025)
Urobilinogen, UA: 0.2 E.U./dL
pH, UA: 5 (ref 5.0–8.0)

## 2020-05-07 MED ORDER — NITROFURANTOIN MONOHYD MACRO 100 MG PO CAPS
100.0000 mg | ORAL_CAPSULE | Freq: Two times a day (BID) | ORAL | 0 refills | Status: DC
Start: 2020-05-07 — End: 2021-08-30

## 2020-05-07 NOTE — ED Triage Notes (Signed)
Lower abd pain/ pressure and urinary frequency since Sunday. Pt states her temp was 101 this morning around 0400, took 2 tylenols at that time.

## 2020-05-07 NOTE — Discharge Instructions (Signed)
Urine with possible UTI Urine culture sent.  We will call you with abnormal results.   Push fluids and get plenty of rest.   Take antibiotic as directed and to completion Follow up with PCP if symptoms persists Return here or go to ER if you have any new or worsening symptoms such as fever, worsening abdominal pain, nausea/vomiting, flank pain, etc..Marland Kitchen

## 2020-05-07 NOTE — ED Provider Notes (Signed)
[facility]   CC: Urinary frequency  SUBJECTIVE:  Kathy Dawson is a 64 y.o. female who complains of urinary frequency, and bladder pressure x 3 days.  Admits to delayed bathroom breaks.  Has tried OTC tylenol with relief.  Symptoms are made worse with urination.  Admits to similar symptoms in the past with UTI.  Reports fever of 101 this morning, resolved with tylenol, 99.3 in office.  Denies chills, nausea, vomiting, flank pain, abnormal vaginal discharge or bleeding, hematuria.    LMP: No LMP recorded. Patient is postmenopausal.  ROS: As in HPI.  All other pertinent ROS negative.     Past Medical History:  Diagnosis Date  . Anemia    Hx  . Anemia, iron deficiency 06/09/2013  . Arthritis    knees, hips, elbow  . Complication of anesthesia 14 yrs. ago   woke up during the appendectomy  . GERD (gastroesophageal reflux disease)    otc med prn  . Knee pain    Past Surgical History:  Procedure Laterality Date  . ABDOMINAL HYSTERECTOMY    . ANTERIOR HIP REVISION Right 11/18/2014   Procedure: Right Total Hip Arthroplasty Femoral Revision-Anteior approach;  Surgeon: Marybelle Killings, MD;  Location: [facility];  Service: Orthopedics;  Laterality: Right;  . APPENDECTOMY    . bilateral foot surgery     hammer toes  . BLADDER SUSPENSION N/A 02/02/2016   Procedure: TRANSVAGINAL TAPE (TVT) Mid-Urethral Sling PROCEDURE;  Surgeon: Azucena Fallen, MD;  Location: [facility];  Service: Gynecology;  Laterality: N/A;  . COLONOSCOPY  12/08/2011   PPJ:[id number] hemorrhoids/Sigmoid diverticulosis. Colonic polyp (tubular adenoma). Surveillance 2018  . COLONOSCOPY N/A 09/09/2017   Procedure: COLONOSCOPY;  Surgeon: Daneil Dolin, MD;  [facility];  Service: Endoscopy;  Laterality: N/A;  2:15pm  . CYSTOSCOPY N/A 02/02/2016   Procedure: CYSTOSCOPY;  Surgeon: Azucena Fallen, MD;  Location: [facility];  Service: Gynecology;  Laterality: N/A;  . ESOPHAGOGASTRODUODENOSCOPY N/A 09/16/2014   Procedure:  ESOPHAGOGASTRODUODENOSCOPY (EGD);  Surgeon: Daneil Dolin, MD;  [facility];  Service: Endoscopy;  Laterality: N/A;  1030am  . KNEE SURGERY Right 2011   arthroscopy  . MALONEY DILATION N/A 09/16/2014   Procedure: Venia Minks DILATION;  Surgeon: Daneil Dolin, MD;  [facility];  Service: Endoscopy;  Laterality: N/A;  . 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 TOTAL HIP ARTHROPLASTY Right 11/04/2014   Procedure: TOTAL HIP ARTHROPLASTY ANTERIOR APPROACH, Possible Local Bonegrafting Acetabular Cyst;  Surgeon: Marybelle Killings, MD;  Location: [facility];  Service: Orthopedics;  Laterality: Right;  . TUBAL LIGATION    . VAGINAL HYSTERECTOMY N/A 02/02/2016   Procedure: HYSTERECTOMY VAGINAL;  Surgeon: Azucena Fallen, MD;  Location: [facility];  Service: Gynecology;  Laterality: N/A;   Allergies  Allergen Reactions  . Clindamycin/Lincomycin Hives and Itching  . Prochlorperazine Other (See Comments)    Seizure like symptoms   No current facility-administered medications on file prior to encounter.   Current Outpatient Medications on File Prior to Encounter  Medication Sig Dispense Refill  . acetaminophen (TYLENOL) 500 MG tablet Take 1,000 mg by mouth every 6 (six) hours as needed for moderate pain or headache.     Marland Kitchen aspirin 81 MG chewable tablet Chew 324 mg by mouth once. (Patient not taking: Reported on 03/10/2020)    . benzonatate (TESSALON) 100 MG capsule Take 1 capsule (100 mg total) by mouth every 8 (eight) hours. 30 capsule 0  . Ca Carbonate-Mag Hydroxide (ROLAIDS)  550-110 MG CHEW Chew 1 tablet by mouth daily as needed (for indigestion).    . fluticasone (FLONASE) 50 MCG/ACT nasal spray Place 1 spray into both nostrils daily for 14 days. 16 g 0  . menthol-cetylpyridinium (CEPACOL REGULAR STRENGTH) 3 MG lozenge Take 1 lozenge (3 mg total) by mouth as needed for sore throat. 100 tablet 2  . OVER THE COUNTER MEDICATION Hemp oil home grown     Social History   Socioeconomic History  .  Marital status: Married    Spouse name: Not on file  . Number of children: Not on file  . Years of education: Not on file  . Highest education level: Not on file  Occupational History  . Occupation: tobacco farmer  Tobacco Use  . Smoking status: Former Smoker    Packs/day: 1.00    Years: 20.00    Pack years: 20.00    Types: Cigarettes    Start date: 73    Quit date: 1995    Years since quitting: 26.7  . Smokeless tobacco: Never Used  Vaping Use  . Vaping Use: Never used  Substance and Sexual Activity  . Alcohol use: No    Alcohol/week: 0.0 standard drinks  . Drug use: No  . Sexual activity: Not Currently    Birth control/protection: None  Other Topics Concern  . Not on file  Social History Narrative  . Not on file   Social Determinants of Health   Financial Resource Strain:   . Difficulty of Paying Living Expenses: Not on file  Food Insecurity:   . Worried About Charity fundraiser in the Last Year: Not on file  . Ran Out of Food in the Last Year: Not on file  Transportation Needs:   . Lack of Transportation (Medical): Not on file  . Lack of Transportation (Non-Medical): Not on file  Physical Activity:   . Days of Exercise per Week: Not on file  . Minutes of Exercise per Session: Not on file  Stress:   . Feeling of Stress : Not on file  Social Connections:   . Frequency of Communication with Friends and Family: Not on file  . Frequency of Social Gatherings with Friends and Family: Not on file  . Attends Religious Services: Not on file  . Active Member of Clubs or Organizations: Not on file  . Attends Archivist Meetings: Not on file  . Marital Status: Not on file  Intimate Partner Violence:   . Fear of Current or Ex-Partner: Not on file  . Emotionally Abused: Not on file  . Physically Abused: Not on file  . Sexually Abused: Not on file   Family History  Problem Relation Age of Onset  . Colon polyps Father   . Cancer Father   . Heart disease  Father   . Heart disease Other   . Colon cancer Neg Hx     OBJECTIVE:  Vitals:   05/07/20 1054 05/07/20 1056  BP: 118/74   Pulse: 80   Resp: 18   Temp: 99.3 F (37.4 C)   TempSrc: Oral   SpO2: 97%   Weight:  211 lb 10.3 oz (96 kg)  Height:  5\' 5"  (1.651 m)   General appearance: Alert in no acute distress HEENT: NCAT.  Oropharynx clear.  Lungs: clear to auscultation bilaterally without adventitious breath sounds Heart: regular rate and rhythm.   Abdomen: soft; non-distended; mild diffuse TTP; bowel sounds present; no guarding Back: no CVA tenderness Extremities: no  edema; symmetrical with no gross deformities Skin: warm and dry Neurologic: Ambulates from chair to exam table without difficulty Psychological: alert and cooperative; normal mood and affect  Labs Reviewed  POCT URINALYSIS DIP (MANUAL ENTRY) - Abnormal; Notable for the following components:      Result Value   Leukocytes, UA Trace (*)    All other components within normal limits  URINE CULTURE    ASSESSMENT & PLAN:  1. Urinary frequency   2. Sensation of pressure in bladder area     Meds ordered this encounter  Medications  . nitrofurantoin, macrocrystal-monohydrate, (MACROBID) 100 MG capsule    Sig: Take 1 capsule (100 mg total) by mouth 2 (two) times daily.    Dispense:  10 capsule    Refill:  0    Order Specific Question:   Supervising Provider    Answer:   Raylene Everts [[id number]]   Urine with possible UTI Urine culture sent.  We will call you with abnormal results.   Push fluids and get plenty of rest.   Take antibiotic as directed and to completion Follow up with PCP if symptoms persists Return here or go to ER if you have any new or worsening symptoms such as fever, worsening abdominal pain, nausea/vomiting, flank pain, etc...  Outlined signs and symptoms indicating need for more acute intervention. Patient verbalized understanding. After Visit Summary given.     Lestine Box,  PA-C 05/07/20 1122

## 2020-05-08 ENCOUNTER — Encounter: Payer: Self-pay | Admitting: *Deleted

## 2020-05-08 ENCOUNTER — Encounter: Payer: Self-pay | Admitting: Interventional Cardiology

## 2020-05-08 ENCOUNTER — Ambulatory Visit (INDEPENDENT_AMBULATORY_CARE_PROVIDER_SITE_OTHER): Payer: 59 | Admitting: Interventional Cardiology

## 2020-05-08 VITALS — BP 116/72 | HR 92 | Ht 65.5 in | Wt 202.0 lb

## 2020-05-08 DIAGNOSIS — R55 Syncope and collapse: Secondary | ICD-10-CM | POA: Diagnosis not present

## 2020-05-08 DIAGNOSIS — R002 Palpitations: Secondary | ICD-10-CM | POA: Diagnosis not present

## 2020-05-08 DIAGNOSIS — R251 Tremor, unspecified: Secondary | ICD-10-CM

## 2020-05-08 LAB — URINE CULTURE: Culture: 50000 — AB

## 2020-05-08 NOTE — Progress Notes (Signed)
Cardiology Office Note   Date:  05/08/2020   ID:  Kathy Dawson, Kathy Dawson [birth year], MRN [id number]  PCP:  Patient, No Pcp Per    No chief complaint on file.  Palpitations, near syncope  Wt Readings from Last 3 Encounters:  05/08/20 202 lb (91.6 kg)  05/07/20 211 lb 10.3 oz (96 kg)  03/10/20 (!) 211 lb 12.8 oz (96.1 kg)       History of Present Illness: Kathy Dawson is a 64 y.o. female  Who has had issues with UTIs.   2017 stress test:  There was no ST segment deviation noted during stress.  The study is normal.  This is a low risk study. Nuclear stress EF: 67%.  She was seen in the ER in July 2021 for presyncope.  Records show: "She presents for evaluation of a dizzy sensation characterized by "spinning in my head, and lightheadedness."  She states that she had a "funny feeling all over."  At one point she felt like she is going to die.  She began feeling better, on the way to the hospital.  This began, this morning and has improved spontaneously.  She was transferred by EMS and was treated with aspirin during transport.  She has been taking her usual medications.  She denies fever, chills, cough, focal weakness or paresthesia.  She denies recent illnesses.  She had a Covid infection in January of this year, but has not had vaccines.  There are no other known modifying factors."    She tells me today that she has some imtermittent shaking.  She knew she might fall and called her husband for help.  Troponin negative.  ECG negative.  PE CT in July 2021 showed no PE, no vascular calcifications noted.  Echo in 8/21 showed: "Left ventricular ejection fraction, by estimation, is 60 to 65%. The  left ventricle has normal function. The left ventricle has no regional  wall motion abnormalities. There is mild left ventricular hypertrophy.  Left ventricular diastolic parameters  are consistent with Grade I diastolic dysfunction (impaired relaxation).  2. Right ventricular  systolic function is normal. The right ventricular  size is normal.  3. The mitral valve is normal in structure. Mild mitral valve  regurgitation. No evidence of mitral stenosis.  4. The aortic valve is tricuspid. Aortic valve regurgitation is trivial.  Mild aortic valve sclerosis is present, with no evidence of aortic valve  stenosis.  5. The inferior vena cava is normal in size with greater than 50%  respiratory variability, suggesting right atrial pressure of 3 mmHg. "  She has had some further episodes of shaking, anxiety as well.  BP was high as well at times.   She has felt as well.  All of the episodes start the same way, where she feels she cannot focus her vision.  She does not recall if she had a headache.  She would chew a baby aspirin and then go lie down.  She has had palpitations lasting up to 10-15 minutes up to several hours. She has some shortness of breath.   She does not walk on a regular basis.  She works as a Systems developer.  She has had several joint replacements. She has had Epstein Barr virus.  She had COVID in Jan 2021.   Father had stents, but passed away from stomach cancer.   She tries to drink plenty of water.   Denies : exertional Chest pain. Leg edema. Nitroglycerin use. Orthopnea.  Paroxysmal nocturnal dyspnea. Shortness of breath. Syncope.   Past Medical History:  Diagnosis Date   Anemia    Hx   Anemia, iron deficiency 06/09/2013   Arthritis    knees, hips, elbow   Complication of anesthesia 14 yrs. ago   woke up during the appendectomy   GERD (gastroesophageal reflux disease)    otc med prn   Knee pain     Past Surgical History:  Procedure Laterality Date   ABDOMINAL HYSTERECTOMY     ANTERIOR HIP REVISION Right 11/18/2014   Procedure: Right Total Hip Arthroplasty Femoral Revision-Anteior approach;  Surgeon: Marybelle Killings, MD;  Location: [facility];  Service: Orthopedics;  Laterality: Right;   APPENDECTOMY     bilateral foot surgery      hammer toes   BLADDER SUSPENSION N/A 02/02/2016   Procedure: TRANSVAGINAL TAPE (TVT) Mid-Urethral Sling PROCEDURE;  Surgeon: Azucena Fallen, MD;  Location: [facility];  Service: Gynecology;  Laterality: N/A;   COLONOSCOPY  12/08/2011   HAL:[id number] hemorrhoids/Sigmoid diverticulosis. Colonic polyp (tubular adenoma). Surveillance 2018   COLONOSCOPY N/A 09/09/2017   Procedure: COLONOSCOPY;  Surgeon: Daneil Dolin, MD;  [facility];  Service: Endoscopy;  Laterality: N/A;  2:15pm   CYSTOSCOPY N/A 02/02/2016   Procedure: CYSTOSCOPY;  Surgeon: Azucena Fallen, MD;  Location: [facility];  Service: Gynecology;  Laterality: N/A;   ESOPHAGOGASTRODUODENOSCOPY N/A 09/16/2014   Procedure: ESOPHAGOGASTRODUODENOSCOPY (EGD);  Surgeon: Daneil Dolin, MD;  [facility];  Service: Endoscopy;  Laterality: N/A;  1030am   KNEE SURGERY Right 2011   arthroscopy   MALONEY DILATION N/A 09/16/2014   Procedure: Venia Minks DILATION;  Surgeon: Daneil Dolin, MD;  [facility];  Service: Endoscopy;  Laterality: N/A;   REPLACEMENT TOTAL KNEE Right 01/19/2013   TOTAL HIP ARTHROPLASTY Right 11/04/2014   Procedure: TOTAL HIP ARTHROPLASTY ANTERIOR APPROACH, Possible Local Bonegrafting Acetabular Cyst;  Surgeon: Marybelle Killings, MD;  Location: [facility];  Service: Orthopedics;  Laterality: Right;   TUBAL LIGATION     VAGINAL HYSTERECTOMY N/A 02/02/2016   Procedure: HYSTERECTOMY VAGINAL;  Surgeon: Azucena Fallen, MD;  Location: [facility];  Service: Gynecology;  Laterality: N/A;     Current Outpatient Medications  Medication Sig Dispense Refill   acetaminophen (TYLENOL) 500 MG tablet Take 1,000 mg by mouth every 6 (six) hours as needed for moderate pain or headache.      fluticasone (FLONASE) 50 MCG/ACT nasal spray Place into both nostrils as needed for allergies or rhinitis.     nitrofurantoin, macrocrystal-monohydrate, (MACROBID) 100 MG capsule Take 1 capsule (100 mg total) by mouth 2 (two) times daily. 10  capsule 0   OVER THE COUNTER MEDICATION Hemp oil home grown     No current facility-administered medications for this visit.    Allergies:   Clindamycin/lincomycin and Prochlorperazine    Social History:  The patient  reports that she quit smoking about 26 years ago. Her smoking use included cigarettes. She started smoking about 44 years ago. She has a 20.00 pack-year smoking history. She has never used smokeless tobacco. She reports that she does not drink alcohol and does not use drugs.   Family History:  The patient's family history includes Cancer in her father; Colon polyps in her father; Heart disease in her father and another family member.    ROS:  Please see the history of present illness.   Otherwise, review of systems are positive for anxiety, fatigue, palpitations.   All other systems are  reviewed and negative.    PHYSICAL EXAM: VS:  BP 116/72    Pulse 92    Ht 5' 5.5" (1.664 m)    Wt 202 lb (91.6 kg)    SpO2 96%    BMI 33.10 kg/m  , BMI Body mass index is 33.1 kg/m. GEN: Well nourished, well developed, in no acute distress  HEENT: normal  Neck: no JVD, carotid bruits, or masses Cardiac: RRR; no murmurs, rubs, or gallops,no edema  Respiratory:  clear to auscultation bilaterally, normal work of breathing GI: soft, nontender, nondistended, + BS MS: no deformity or atrophy  Skin: warm and dry, no rash Neuro:  Strength and sensation are intact Psych: euthymic mood, full affect   EKG:   The ekg ordered demonstrates NSR, LAFB, no ST changes   Recent Labs: 03/07/2020: BUN 16; Creatinine, Ser 0.67; Hemoglobin 13.8; Platelets 293; Potassium 3.7; Sodium 137   Lipid Panel    Component Value Date/Time   CHOL 165 10/31/2015 0946   TRIG 84 10/31/2015 0946   HDL 44 (L) 10/31/2015 0946   CHOLHDL 3.8 10/31/2015 0946   VLDL 17 10/31/2015 0946   LDLCALC 104 10/31/2015 0946     Other studies Reviewed: Additional studies/ records that were reviewed today with results  demonstrating: tests done in ER reviewed.   ASSESSMENT AND PLAN:  1.   Palpitations: Plan for 14 day Zio patch. With normal LVEF, doubt life threatening arrhythmia. No syncope, only presyncope.  2.   Near syncope: Normal echo.  Stay well hydrated.  No cardiac cause found.   3.   Shaking episodes. Does not sound cardiac.  Question if htrere is a neuro cause? 4.   Possible UTI: just started an antibiotic.    Current medicines are reviewed at length with the patient today.  The patient concerns regarding her medicines were addressed.  The following changes have been made:  No change  Labs/ tests ordered today include:  No orders of the defined types were placed in this encounter.   Recommend 150 minutes/week of aerobic exercise Low fat, low carb, high fiber diet recommended  Disposition:   FU in based on monitor results.    Signed, Larae Grooms, MD  05/08/2020 9:11 AM    [facility] [address], [place], [place]  [address] Phone: [phone]; Fax: [phone]

## 2020-05-08 NOTE — Progress Notes (Signed)
Patient ID: Kathy Dawson, female   DOB: 1955-10-25, 64 y.o.   MRN: [id number] Patient enrolled for [employer] to ship a 14 day ZIO XT long term holter monitor to her home.

## 2020-05-08 NOTE — Patient Instructions (Signed)
Medication Instructions:  Your physician recommends that you continue on your current medications as directed. Please refer to the Current Medication list given to you today.  *If you need a refill on your cardiac medications before your next appointment, please call your pharmacy*   Lab Work: None  If you have labs (blood work) drawn today and your tests are completely normal, you will receive your results only by: Marland Kitchen MyChart Message (if you have MyChart) OR . A paper copy in the mail If you have any lab test that is abnormal or we need to change your treatment, we will call you to review the results.   Testing/Procedures: Your physician has recommended that you wear a 14 day monitor. These monitors are medical devices that record the heart's electrical activity. Doctors most often use these monitors to diagnose arrhythmias. Arrhythmias are problems with the speed or rhythm of the heartbeat. The monitor is a small, portable device. You can wear one while you do your normal daily activities. This is usually used to diagnose what is causing palpitations/syncope (passing out).   Follow-Up: Based on results  Other Instructions ZIO XT- Long Term Monitor Instructions   Your physician has requested you wear your ZIO patch monitor 14 days.   This is a single patch monitor.  [employer] supplies one patch monitor per enrollment.  Additional stickers are not available.   Please do not apply patch if you will be having a Nuclear Stress Test, Echocardiogram, Cardiac CT, MRI, or Chest Xray during the time frame you would be wearing the monitor. The patch cannot be worn during these tests.  You cannot remove and re-apply the ZIO XT patch monitor.   Your ZIO patch monitor will be sent USPS Priority mail from [facility] directly to your home address. The monitor may also be mailed to a PO BOX if home delivery is not available.   It may take 3-5 days to receive your monitor after you have been  enrolled.   Once you have received you monitor, please review enclosed instructions.  Your monitor has already been registered assigning a specific monitor serial # to you.   Applying the monitor   Shave hair from upper left chest.   Hold abrader disc by orange tab.  Rub abrader in 40 strokes over left upper chest as indicated in your monitor instructions.   Clean area with 4 enclosed alcohol pads .  Use all pads to assure are is cleaned thoroughly.  Let dry.   Apply patch as indicated in monitor instructions.  Patch will be place under collarbone on left side of chest with arrow pointing upward.   Rub patch adhesive wings for 2 minutes.Remove white label marked "1".  Remove white label marked "2".  Rub patch adhesive wings for 2 additional minutes.   While looking in a mirror, press and release button in center of patch.  A small green light will flash 3-4 times .  This will be your only indicator the monitor has been turned on.     Do not shower for the first 24 hours.  You may shower after the first 24 hours.   Press button if you feel a symptom. You will hear a small click.  Record Date, Time and Symptom in the Patient Log Book.   When you are ready to remove patch, follow instructions on last 2 pages of Patient Log Book.  Stick patch monitor onto last page of Patient Log Book.   Place Patient  Log Book in [facility] box.  Use locking tab on box and tape box closed securely.  The [employer] and [employer] has [employer] on it.  Please place in mailbox as soon as possible.  Your physician should have your test results approximately 7 days after the monitor has been mailed back to [facility].   Call [facility] at [phone] if you have questions regarding your ZIO XT patch monitor.  Call them immediately if you see an orange light blinking on your monitor.   If your monitor falls off in less than 4 days contact our Monitor department at [phone].  If your monitor  becomes loose or falls off after 4 days call [employer] at [phone] for suggestions on securing your monitor.

## 2020-05-16 ENCOUNTER — Other Ambulatory Visit (INDEPENDENT_AMBULATORY_CARE_PROVIDER_SITE_OTHER): Payer: 59

## 2020-05-16 DIAGNOSIS — R002 Palpitations: Secondary | ICD-10-CM

## 2020-05-16 DIAGNOSIS — R251 Tremor, unspecified: Secondary | ICD-10-CM

## 2020-05-16 DIAGNOSIS — R55 Syncope and collapse: Secondary | ICD-10-CM

## 2020-06-16 ENCOUNTER — Telehealth: Payer: Self-pay | Admitting: Interventional Cardiology

## 2020-06-16 NOTE — Telephone Encounter (Signed)
Patient is returning call to discuss monitor results. 

## 2020-06-16 NOTE — Telephone Encounter (Signed)
-----   Message from Jettie Booze, MD sent at 06/13/2020  1:04 PM EDT ----- As noted in results

## 2020-06-16 NOTE — Telephone Encounter (Signed)
The patient has been notified of the result and verbalized understanding.  All questions (if any) were answered. Cleon Gustin, RN 06/16/2020 9:07 AM

## 2020-06-18 ENCOUNTER — Telehealth: Payer: Self-pay

## 2020-06-18 NOTE — Telephone Encounter (Signed)
Patient faxed over a denial form from her insurance company for the 14 day zio monitor that she just wore. I have sent this denial form to our billing department and the rep from [facility] for review.   I spoke with Ailene Ravel from [employer] and she states that they are currently not contracted with [employer] (the [employer]), but they will work out coverage to ensure patient does not have to pay for monitor.   Left detailed message on patient's VM letting her know that I was sending to the billing department and the rep from the company and that there should be no cost for her. Instructed the patient to call back with any questions.

## 2020-09-19 ENCOUNTER — Ambulatory Visit (HOSPITAL_COMMUNITY): Payer: 59 | Attending: Orthopedic Surgery

## 2020-09-19 ENCOUNTER — Other Ambulatory Visit: Payer: Self-pay

## 2020-09-19 DIAGNOSIS — M25612 Stiffness of left shoulder, not elsewhere classified: Secondary | ICD-10-CM | POA: Insufficient documentation

## 2020-09-19 DIAGNOSIS — M25512 Pain in left shoulder: Secondary | ICD-10-CM | POA: Diagnosis present

## 2020-09-19 DIAGNOSIS — R29898 Other symptoms and signs involving the musculoskeletal system: Secondary | ICD-10-CM | POA: Diagnosis present

## 2020-09-19 DIAGNOSIS — G8929 Other chronic pain: Secondary | ICD-10-CM | POA: Insufficient documentation

## 2020-09-19 NOTE — Therapy (Signed)
Kathy [place], [place], [address] Phone: [phone]   Fax:  [phone]  Occupational Therapy Evaluation  Patient Details  Name: Kathy Dawson MRN: [id number] Date of Birth: Apr 29, 1956 Referring Provider (OT): Elsie Saas, MD   Encounter Date: 09/19/2020   OT End of Session - 09/19/20 1109    Visit Number 1    Number of Visits 4    Date for OT Re-Evaluation 10/17/20    Authorization Type [employer] $25.00 due towards bill 30% co-insurance    Authorization Time Period 30 visit limit OT/PT/Chiro    Authorization - Visit Number 1    Authorization - Number of Visits 30    OT Start Time [phone]    OT Stop Time 0900    OT Time Calculation (min) 44 min    Activity Tolerance Patient tolerated treatment well    Behavior During Therapy [facility] for tasks assessed/performed           Past Medical History:  Diagnosis Date  . Anemia    Hx  . Anemia, iron deficiency 06/09/2013  . Arthritis    knees, hips, elbow  . Complication of anesthesia 14 yrs. ago   woke up during the appendectomy  . GERD (gastroesophageal reflux disease)    otc med prn  . Knee pain     Past Surgical History:  Procedure Laterality Date  . ABDOMINAL HYSTERECTOMY    . ANTERIOR HIP REVISION Right 11/18/2014   Procedure: Right Total Hip Arthroplasty Femoral Revision-Anteior approach;  Surgeon: Kathy Killings, MD;  Location: [place];  Service: Orthopedics;  Laterality: Right;  . APPENDECTOMY    . bilateral foot surgery     hammer toes  . BLADDER SUSPENSION N/A 02/02/2016   Procedure: TRANSVAGINAL TAPE (TVT) Mid-Urethral Sling PROCEDURE;  Surgeon: Kathy Fallen, MD;  Location: [facility];  Service: Gynecology;  Laterality: N/A;  . COLONOSCOPY  12/08/2011   [id number] hemorrhoids/Sigmoid diverticulosis. Colonic polyp (tubular adenoma). Surveillance 2018  . COLONOSCOPY N/A 09/09/2017   Procedure: COLONOSCOPY;  Surgeon: Daneil Dolin, MD;  [facility];  Service: Endoscopy;  Laterality: N/A;  2:15pm  . CYSTOSCOPY N/A 02/02/2016   Procedure: CYSTOSCOPY;  Surgeon: Kathy Fallen, MD;  Location: [facility];  Service: Gynecology;  Laterality: N/A;  . ESOPHAGOGASTRODUODENOSCOPY N/A 09/16/2014   Procedure: ESOPHAGOGASTRODUODENOSCOPY (EGD);  Surgeon: Daneil Dolin, MD;  [facility];  Service: Endoscopy;  Laterality: N/A;  1030am  . KNEE SURGERY Right 2011   arthroscopy  . MALONEY DILATION N/A 09/16/2014   Procedure: Venia Minks DILATION;  Surgeon: Daneil Dolin, MD;  [facility];  Service: Endoscopy;  Laterality: N/A;  . 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 TOTAL HIP ARTHROPLASTY Right 11/04/2014   Procedure: TOTAL HIP ARTHROPLASTY ANTERIOR APPROACH, Possible Local Bonegrafting Acetabular Cyst;  Surgeon: Kathy Killings, MD;  Location: [facility];  Service: Orthopedics;  Laterality: Right;  . TUBAL LIGATION    . VAGINAL HYSTERECTOMY N/A 02/02/2016   Procedure: HYSTERECTOMY VAGINAL;  Surgeon: Kathy Fallen, MD;  Location: [facility];  Service: Gynecology;  Laterality: N/A;    There were no vitals filed for this visit.   Subjective Assessment - 09/19/20 0836    Subjective  S: Insurance will not approve a MRI until I go through therapy.    Pertinent History Patient is a 65 y/o female S/P left shoulder RTC tendonitis and impingement which as been ongoing for at least 6 months. Patient  received an injection at her last MD appointment and reports a decrease in pain and she has been able to move it more. Pt also reports that a MRI will not be approved by insurance until she completes therapy. There is a suspected tear. Dr. Noemi Chapel has referred patient to OT for evaluation and treatment.    Patient Stated Goals I would like to avoid surgery.    Currently in Pain? Yes    Pain Score 3     Pain Location Shoulder    Pain Orientation Left    Pain Descriptors / Indicators Aching    Pain Type Chronic pain    Pain Radiating Towards elbow    Pain  Onset More than a month ago    Pain Frequency Constant    Aggravating Factors  movement, use, reaching,    Pain Relieving Factors over the counter pain medication, rest    Effect of Pain on Daily Activities severe effect (more so prior to the injection)             [facility] OT Assessment - 09/19/20 0823      Assessment   Medical Diagnosis Left RTC tendonitis with impingement    Referring Provider (OT) Elsie Saas, MD    Onset Date/Surgical Date --   at least 6 months ago   Hand Dominance Right    Prior Therapy PT for Hip replacement and knee      Precautions   Precautions None      Restrictions   Weight Bearing Restrictions No      Balance Screen   Has the patient Dawson in the past 6 months Yes    How many times? 2    Has the patient had a decrease in activity level because of a fear of falling?  No    Is the patient reluctant to leave their home because of a fear of falling?  No      Home  Environment   Family/patient expects to be discharged to: Private residence      Prior Function   Level of Independence Independent    Vocation Self employed    [employer] - full time    Leisure 5 grandkids, gardening      ADL   ADL comments Before injection patient had difficulty lifting arm, brushing hair, doing hair, putting on bra.      Mobility   Mobility Status History of falls      Vision - History   Baseline Vision Wears glasses all the time      Cognition   Overall Cognitive Status Within Functional Limits for tasks assessed      Observation/Other Assessments   Focus on Therapeutic Outcomes (FOTO)  N/A      Posture/Postural Control   Posture/Postural Control Postural limitations    Postural Limitations Rounded Shoulders      ROM / Strength   AROM / PROM / Strength AROM;PROM;Strength      Palpation   Palpation comment Moderate fascial restrictions palpated in left anterior shoulder region only.      AROM   Overall  AROM Comments Assessed seated. IR/er abducted.    AROM Assessment Site Shoulder    Right/Left Shoulder Left    Left Shoulder Flexion 140 Degrees   right: 160   Left Shoulder ABduction 130 Degrees   right: 160   Left Shoulder Internal Rotation 55 Degrees   right: 75   Left Shoulder External Rotation 60  Degrees   right: 80     PROM   Overall PROM Comments Assessed supine. IR/er abducted    PROM Assessment Site Shoulder    Right/Left Shoulder Left    Left Shoulder Flexion 160 Degrees    Left Shoulder ABduction 180 Degrees    Left Shoulder Internal Rotation 90 Degrees    Left Shoulder External Rotation 75 Degrees      Strength   Overall Strength Comments Assessed seated. IR/er adducted    Strength Assessment Site Shoulder    Right/Left Shoulder Left    Left Shoulder Flexion 5/5    Left Shoulder ABduction 5/5    Left Shoulder Internal Rotation 5/5    Left Shoulder External Rotation 5/5                           OT Education - 09/19/20 1038    Education Details shoulder impingement and RTC tendonitis - causes, anatomy, treatment plan. HEP: shoulder stretches: flexion    Person(s) Educated Patient    Methods Explanation;Demonstration;Verbal cues;Handout    Comprehension Verbalized understanding            OT Short Term Goals - 09/19/20 1113      OT SHORT TERM GOAL #1   Title Patient will be educated and verbalize understanding of her HEP in order to faciliate her progress in therapy and be able to continue working on increasing functional use of her LUE with less discomfort.    Time 4    Period Weeks    Status New    Target Date 10/17/20      OT SHORT TERM GOAL #2   Title Patient will report a decrease in pain level of no more than 2/10 while completing her daily tasks using her LUE.    Time 4    Period Weeks    Status New      OT SHORT TERM GOAL #3   Title Patient will decrease her left UE fascial restrictions to min amount or less to allow her to  increase the functional mobility needed to complete reaching overhead and self care tasks such as fixing her hair.    Time 4    Period Weeks    Status New      OT SHORT TERM GOAL #4   Title Patient will increase A/ROM of LUE to [facility] while experiencing less discomfort and greater shoulder and scapular stability which will allow her to work towards returning to work tasks.    Time 4    Period Weeks    Status New                    Plan - 09/19/20 1110    Clinical Impression Statement A: Patient is a 65 y/o female S/P left RTC tendonitis and impingement causing increased pain, fascial restrictions, and decreased ROM resulting in difficulty completing required daily tasks and work related activities while attempting to use her LUE.    OT Occupational Profile and History Problem Focused Assessment - Including review of records relating to presenting problem    Occupational performance deficits (Please refer to evaluation for details): ADL's;IADL's;Work    Body Structure / Function / Physical Skills ADL;UE functional use;Fascial restriction;Pain;ROM;Mobility    Rehab Potential Good    Clinical Decision Making Limited treatment options, no task modification necessary    Comorbidities Affecting Occupational Performance: May have comorbidities impacting occupational performance    Modification or Assistance  to Complete Evaluation  No modification of tasks or assist necessary to complete eval    OT Frequency 1x / week    OT Duration 4 weeks    OT Treatment/Interventions Self-care/ADL training;Ultrasound;Patient/family education;DME and/or AE instruction;Passive range of motion;Electrical Stimulation;Cryotherapy;Moist Heat;Neuromuscular education;Therapeutic activities;Manual Therapy;Therapeutic exercise    Plan P: Patient will benefit from skilled OT services to increase functional performance while using her LUE during daily and work related tasks. Treatment Plan: Myofascial release, manual  stretching, A/ROM, Scapular strengthening. Update HEP as needed as patient is only once a week.    OT Home Exercise Plan eval: shoulder stretches    Consulted and Agree with Plan of Care Patient           Patient will benefit from skilled therapeutic intervention in order to improve the following deficits and impairments:   Body Structure / Function / Physical Skills: ADL,UE functional use,Fascial restriction,Pain,ROM,Mobility       Visit Diagnosis: Other symptoms and signs involving the musculoskeletal system - Plan: Ot plan of care cert/re-cert  Chronic left shoulder pain - Plan: Ot plan of care cert/re-cert  Stiffness of left shoulder, not elsewhere classified - Plan: Ot plan of care cert/re-cert    Problem List Patient Active Problem List   Diagnosis Date Noted  . Urinary, incontinence, stress female 02/02/2016  . 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 GERD (gastroesophageal reflux disease) 08/29/2014  . Tubular adenoma of colon 08/29/2014  . Dysphagia, pharyngoesophageal phase 08/29/2014  . Anemia, iron deficiency 06/09/2013  . Difficulty in walking(719.7) 02/05/2013  . Stiffness of right knee 02/05/2013  . Rectal bleeding 11/27/2011  . Constipation 11/27/2011  . DERANGEMENT MENISCUS 08/26/2008  . KNEE, ARTHRITIS, DEGEN./OSTEO 07/25/2008  . KNEE PAIN 07/25/2008   Ailene Ravel, OTR/L,CBIS  [phone]  09/19/2020, 11:19 AM  [place], [place], [address] Phone: [phone]   Fax:  [phone]  Name: SAADIA SHAYNE MRN: [id number] Date of Birth: Nov 27, 1955

## 2020-09-19 NOTE — Patient Instructions (Signed)
Complete the following exercises 2-3 times a day.  Doorway Stretch  Place each hand opposite each other on the doorway. (You can change where you feel the stretch by moving arms higher or lower.) Step through with one foot and bend front knee until a stretch is felt and hold. Step through with the opposite foot on the next rep. Hold for __20-30___ seconds. Repeat __2__times.        Wall Flexion  Slide your arm up the wall or door frame until a stretch is felt in your shoulder . Hold for 20-30 seconds. Complete 2 times     Shoulder Abduction Stretch  Stand side ways by a wall with affected up on wall. Gently step in toward wall to feel stretch. Hold for 20-30 seconds. Complete 2 times.

## 2020-10-01 ENCOUNTER — Ambulatory Visit (HOSPITAL_COMMUNITY): Payer: 59

## 2020-10-08 ENCOUNTER — Encounter (HOSPITAL_COMMUNITY): Payer: Self-pay | Admitting: Occupational Therapy

## 2020-10-08 ENCOUNTER — Ambulatory Visit (HOSPITAL_COMMUNITY): Payer: 59 | Admitting: Occupational Therapy

## 2020-10-08 ENCOUNTER — Other Ambulatory Visit: Payer: Self-pay

## 2020-10-08 DIAGNOSIS — R29898 Other symptoms and signs involving the musculoskeletal system: Secondary | ICD-10-CM

## 2020-10-08 DIAGNOSIS — M25612 Stiffness of left shoulder, not elsewhere classified: Secondary | ICD-10-CM

## 2020-10-08 DIAGNOSIS — G8929 Other chronic pain: Secondary | ICD-10-CM

## 2020-10-08 DIAGNOSIS — M25512 Pain in left shoulder: Secondary | ICD-10-CM

## 2020-10-08 NOTE — Patient Instructions (Signed)

## 2020-10-08 NOTE — Therapy (Signed)
[place], [place], [address] Phone: [phone]   Fax:  [phone]  Occupational Therapy Treatment  Patient Details  Name: Kathy [place] MRN: [id number] Date of Birth: [birth year] Referring Provider (OT): Elsie Saas, MD   Encounter Date: 10/08/2020   OT End of Session - 10/08/20 0856    Visit Number 2    Number of Visits 4    Date for OT Re-Evaluation 10/17/20    Authorization Type [employer] $25.00 due towards bill 30% co-insurance    Authorization Time Period 30 visit limit OT/PT/Chiro    Authorization - Visit Number 2    Authorization - Number of Visits 30    OT Start Time [phone]    OT Stop Time 0856    OT Time Calculation (min) 39 min    Activity Tolerance Patient tolerated treatment well    Behavior During Therapy [facility] for tasks assessed/performed           Past Medical History:  Diagnosis Date  . Anemia    Hx  . Anemia, iron deficiency 06/09/2013  . Arthritis    knees, hips, elbow  . Complication of anesthesia 14 yrs. ago   woke up during the appendectomy  . GERD (gastroesophageal reflux disease)    otc med prn  . Knee pain     Past Surgical History:  Procedure Laterality Date  . ABDOMINAL HYSTERECTOMY    . ANTERIOR HIP REVISION Right 11/18/2014   Procedure: Right Total Hip Arthroplasty Femoral Revision-Anteior approach;  Surgeon: Marybelle Killings, MD;  Location: [place];  Service: Orthopedics;  Laterality: Right;  . APPENDECTOMY    . bilateral foot surgery     hammer toes  . BLADDER SUSPENSION N/A 02/02/2016   Procedure: TRANSVAGINAL TAPE (TVT) Mid-Urethral Sling PROCEDURE;  Surgeon: Azucena Fallen, MD;  Location: [facility];  Service: Gynecology;  Laterality: N/A;  . COLONOSCOPY  12/08/2011   [id number] hemorrhoids/Sigmoid diverticulosis. Colonic polyp (tubular adenoma). Surveillance 2018  . COLONOSCOPY N/A 09/09/2017   Procedure: COLONOSCOPY;  Surgeon: Daneil Dolin, MD;  [facility];  Service: Endoscopy;  Laterality: N/A;  2:15pm  . CYSTOSCOPY N/A 02/02/2016   Procedure: CYSTOSCOPY;  Surgeon: Azucena Fallen, MD;  Location: [facility];  Service: Gynecology;  Laterality: N/A;  . ESOPHAGOGASTRODUODENOSCOPY N/A 09/16/2014   Procedure: ESOPHAGOGASTRODUODENOSCOPY (EGD);  Surgeon: Daneil Dolin, MD;  [facility];  Service: Endoscopy;  Laterality: N/A;  1030am  . KNEE SURGERY Right 2011   arthroscopy  . MALONEY DILATION N/A 09/16/2014   Procedure: Venia Minks DILATION;  Surgeon: Daneil Dolin, MD;  [facility];  Service: Endoscopy;  Laterality: N/A;  . 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 TOTAL HIP ARTHROPLASTY Right 11/04/2014   Procedure: TOTAL HIP ARTHROPLASTY ANTERIOR APPROACH, Possible Local Bonegrafting Acetabular Cyst;  Surgeon: Marybelle Killings, MD;  Location: [place];  Service: Orthopedics;  Laterality: Right;  . TUBAL LIGATION    . VAGINAL HYSTERECTOMY N/A 02/02/2016   Procedure: HYSTERECTOMY VAGINAL;  Surgeon: Azucena Fallen, MD;  Location: [facility];  Service: Gynecology;  Laterality: N/A;    There were no vitals filed for this visit.   Subjective Assessment - 10/08/20 0817    Subjective  S: I have to do something to make it hurt.    Currently in Pain? No/denies              [facility] OT Assessment - 10/08/20 0814  Assessment   Medical Diagnosis Left RTC tendonitis with impingement      Precautions   Precautions None                    OT Treatments/Exercises (OP) - 10/08/20 1696      Exercises   Exercises Shoulder      Shoulder Exercises: Supine   Protraction PROM;5 reps;AROM;10 reps    Horizontal ABduction PROM;5 reps;AROM;10 reps    External Rotation PROM;5 reps;AROM;10 reps    Internal Rotation PROM;5 reps;AROM;10 reps    Flexion PROM;5 reps;AROM;10 reps    ABduction PROM;5 reps;AROM;10 reps      Shoulder Exercises: Standing   Protraction AROM;10 reps    Horizontal ABduction AROM;10 reps    External Rotation  AROM;10 reps    Internal Rotation AROM;10 reps    Flexion AROM;10 reps    ABduction AROM;10 reps    Extension Theraband;10 reps    Theraband Level (Shoulder Extension) Level 2 (Red)    Row Theraband;10 reps    Theraband Level (Shoulder Row) Level 2 (Red)    Retraction Theraband;10 reps    Theraband Level (Shoulder Retraction) Level 2 (Red)      Shoulder Exercises: ROM/Strengthening   Proximal Shoulder Strengthening, Supine 10X each, no rest break    Proximal Shoulder Strengthening, Seated 10X each, no rest breaks    Other ROM/Strengthening Exercises proximal shoulder strengthening on doorway, 1' flexion 40" abduction      Manual Therapy   Manual Therapy Myofascial release    Manual therapy comments completed separately from therapeutic exericses    Myofascial Release myofascial release to left upper arm, trapezius, and scapular regions to decrease pain and fascial restrictions and improve ROM                  OT Education - 10/08/20 0837    Education Details A/ROM exercises    Person(s) Educated Patient    Methods Explanation;Demonstration;Verbal cues;Handout    Comprehension Verbalized understanding;Returned demonstration            OT Short Term Goals - 10/08/20 0835      OT SHORT TERM GOAL #1   Title Patient will be educated and verbalize understanding of her HEP in order to faciliate her progress in therapy and be able to continue working on increasing functional use of her LUE with less discomfort.    Time 4    Period Weeks    Status On-going    Target Date 10/17/20      OT SHORT TERM GOAL #2   Title Patient will report a decrease in pain level of no more than 2/10 while completing her daily tasks using her LUE.    Time 4    Period Weeks    Status On-going      OT SHORT TERM GOAL #3   Title Patient will decrease her left UE fascial restrictions to min amount or less to allow her to increase the functional mobility needed to complete reaching overhead and  self care tasks such as fixing her hair.    Time 4    Period Weeks    Status On-going      OT SHORT TERM GOAL #4   Title Patient will increase A/ROM of LUE to [facility] while experiencing less discomfort and greater shoulder and scapular stability which will allow her to work towards returning to work tasks.    Time 4    Period Weeks    Status On-going  Plan - 10/08/20 0835    Clinical Impression Statement A: Pt reports she has been completing her exercises and has had minimal pain since last session. She is using her arm during ADLs and functional reaching tasks with minimal difficulty. Initiated myofascial release to address fascial restrictions today. P/ROM completed with pt tolerating ROM WNL. Pt completing A/ROM in supine and standing, updated HEP for A/ROM. Pt completing scapular theraband strengthening as well. Verbal cuing for form and technique.    Body Structure / Function / Physical Skills ADL;UE functional use;Fascial restriction;Pain;ROM;Mobility    Plan P: Follow up on MD appt and updated HEP; Reassess; continue with scapular theraband and add to HEP    OT Home Exercise Plan eval: shoulder stretches; 2/23: shoulder A/ROM    Consulted and Agree with Plan of Care Patient           Patient will benefit from skilled therapeutic intervention in order to improve the following deficits and impairments:   Body Structure / Function / Physical Skills: ADL,UE functional use,Fascial restriction,Pain,ROM,Mobility       Visit Diagnosis: Other symptoms and signs involving the musculoskeletal system  Chronic left shoulder pain  Stiffness of left shoulder, not elsewhere classified    Problem List Patient Active Problem List   Diagnosis Date Noted  . Urinary, incontinence, stress female 02/02/2016  . 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 GERD (gastroesophageal reflux disease) 08/29/2014  . Tubular adenoma  of colon 08/29/2014  . Dysphagia, pharyngoesophageal phase 08/29/2014  . Anemia, iron deficiency 06/09/2013  . Difficulty in walking(719.7) 02/05/2013  . Stiffness of right knee 02/05/2013  . Rectal bleeding 11/27/2011  . Constipation 11/27/2011  . DERANGEMENT MENISCUS 08/26/2008  . KNEE, ARTHRITIS, DEGEN./OSTEO 07/25/2008  . KNEE PAIN 07/25/2008   Guadelupe Sabin, OTR/L  [phone] 10/08/2020, 8:58 AM  [place] [address] [place], [place], [address] Phone: [phone]   Fax:  [phone]  Name: Kathy [place] MRN: [id number] Date of Birth: 1956/06/20

## 2020-10-10 ENCOUNTER — Other Ambulatory Visit: Payer: Self-pay

## 2020-10-10 ENCOUNTER — Other Ambulatory Visit (HOSPITAL_COMMUNITY): Payer: Self-pay | Admitting: Orthopedic Surgery

## 2020-10-10 ENCOUNTER — Ambulatory Visit (HOSPITAL_COMMUNITY)
Admission: RE | Admit: 2020-10-10 | Discharge: 2020-10-10 | Disposition: A | Discharge status: Home / Self Care | Payer: 59 | Source: Ambulatory Visit | Attending: Orthopedic Surgery | Admitting: Orthopedic Surgery

## 2020-10-10 DIAGNOSIS — R600 Localized edema: Secondary | ICD-10-CM

## 2020-10-15 ENCOUNTER — Ambulatory Visit (HOSPITAL_COMMUNITY): Payer: 59 | Attending: Orthopedic Surgery

## 2020-10-15 ENCOUNTER — Encounter (HOSPITAL_COMMUNITY): Payer: Self-pay

## 2020-10-15 ENCOUNTER — Other Ambulatory Visit: Payer: Self-pay

## 2020-10-15 DIAGNOSIS — R29898 Other symptoms and signs involving the musculoskeletal system: Secondary | ICD-10-CM | POA: Insufficient documentation

## 2020-10-15 DIAGNOSIS — M25512 Pain in left shoulder: Secondary | ICD-10-CM | POA: Insufficient documentation

## 2020-10-15 DIAGNOSIS — G8929 Other chronic pain: Secondary | ICD-10-CM | POA: Insufficient documentation

## 2020-10-15 DIAGNOSIS — M25612 Stiffness of left shoulder, not elsewhere classified: Secondary | ICD-10-CM | POA: Insufficient documentation

## 2020-10-15 NOTE — Patient Instructions (Signed)
Try to complete 2-3 times a week if possible.  1) (Home) Extension: Isometric / Bilateral Arm Retraction - Sitting   Facing anchor, hold hands and elbow at shoulder height, with elbow bent.  Pull arms back to squeeze shoulder blades together. Repeat 10-15 times. 1 time/day.   2) (Clinic) Extension / Flexion (Assist)   Face anchor, pull arms back, keeping elbow straight, and squeze shoulder blades together. Repeat 10-15 times. 1 time/day.   Copyright  V[employer]. All rights reserved.   3) (Home) Retraction: Row - Bilateral (Anchor)   Facing anchor, arms reaching forward, pull hands toward stomach, keeping elbows bent and at your sides and pinching shoulder blades together. Repeat 10-15 times. 1 time/day.   Copyright  VHI. All rights reserved.

## 2020-10-15 NOTE — Therapy (Signed)
[facility] [place], [place], [address] Phone: [phone]   Fax:  [phone]  Occupational Therapy Treatment  Patient Details  Name: Kathy Dawson MRN: [id number] Date of Birth: [birth year] Referring Provider (OT): Elsie Saas, MD   Encounter Date: 10/15/2020   OT End of Session - 10/15/20 0906    Visit Number 3    Number of Visits 4    Authorization Type [employer] $25.00 due towards bill 30% co-insurance    Authorization Time Period 30 visit limit OT/PT/Chiro    Authorization - Visit Number 3    Authorization - Number of Visits 30    OT Start Time 0815   reassess and discharge   OT Stop Time 0853    OT Time Calculation (min) 38 min    Activity Tolerance Patient tolerated treatment well    Behavior During Therapy [facility] for tasks assessed/performed           Past Medical History:  Diagnosis Date  . Anemia    Hx  . Anemia, iron deficiency 06/09/2013  . Arthritis    knees, hips, elbow  . Complication of anesthesia 14 yrs. ago   woke up during the appendectomy  . GERD (gastroesophageal reflux disease)    otc med prn  . Knee pain     Past Surgical History:  Procedure Laterality Date  . ABDOMINAL HYSTERECTOMY    . ANTERIOR HIP REVISION Right 11/18/2014   Procedure: Right Total Hip Arthroplasty Femoral Revision-Anteior approach;  Surgeon: Marybelle Killings, MD;  Location: [facility];  Service: Orthopedics;  Laterality: Right;  . APPENDECTOMY    . bilateral foot surgery     hammer toes  . BLADDER SUSPENSION N/A 02/02/2016   Procedure: TRANSVAGINAL TAPE (TVT) Mid-Urethral Sling PROCEDURE;  Surgeon: Azucena Fallen, MD;  Location: [facility];  Service: Gynecology;  Laterality: N/A;  . COLONOSCOPY  12/08/2011   [id number] hemorrhoids/Sigmoid diverticulosis. Colonic polyp (tubular adenoma). Surveillance 2018  . COLONOSCOPY N/A 09/09/2017   Procedure: COLONOSCOPY;  Surgeon: Daneil Dolin, MD;  [facility];  Service:  Endoscopy;  Laterality: N/A;  2:15pm  . CYSTOSCOPY N/A 02/02/2016   Procedure: CYSTOSCOPY;  Surgeon: Azucena Fallen, MD;  Location: [facility];  Service: Gynecology;  Laterality: N/A;  . ESOPHAGOGASTRODUODENOSCOPY N/A 09/16/2014   Procedure: ESOPHAGOGASTRODUODENOSCOPY (EGD);  Surgeon: Daneil Dolin, MD;  [facility];  Service: Endoscopy;  Laterality: N/A;  1030am  . KNEE SURGERY Right 2011   arthroscopy  . MALONEY DILATION N/A 09/16/2014   Procedure: Venia Minks DILATION;  Surgeon: Daneil Dolin, MD;  [facility];  Service: Endoscopy;  Laterality: N/A;  . 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 TOTAL HIP ARTHROPLASTY Right 11/04/2014   Procedure: TOTAL HIP ARTHROPLASTY ANTERIOR APPROACH, Possible Local Bonegrafting Acetabular Cyst;  Surgeon: Marybelle Killings, MD;  Location: [facility];  Service: Orthopedics;  Laterality: Right;  . TUBAL LIGATION    . VAGINAL HYSTERECTOMY N/A 02/02/2016   Procedure: HYSTERECTOMY VAGINAL;  Surgeon: Azucena Fallen, MD;  Location: [facility];  Service: Gynecology;  Laterality: N/A;    There were no vitals filed for this visit.   Subjective Assessment - 10/15/20 0821    Subjective  S: It feels a lot better than it did before I got the shot.    Currently in Pain? No/denies              [facility] OT Assessment - 10/15/20 6440  Assessment   Medical Diagnosis Left RTC tendonitis with impingement      Precautions   Precautions None      ROM / Strength   AROM / PROM / Strength AROM;PROM;Strength      Palpation   Palpation comment No fascial restrictions palpated this date in left upper arm and upper trapezius.      AROM   Overall AROM Comments Assessed seated. IR/er abducted.    AROM Assessment Site Shoulder    Right/Left Shoulder Left    Left Shoulder Flexion 162 Degrees   previous: 140   Left Shoulder ABduction 151 Degrees   previous: 130   Left Shoulder Internal Rotation 65 Degrees   previous: 55   Left Shoulder External Rotation 76 Degrees    previous: 60     PROM   Overall PROM  Within functional limits for tasks performed    Overall PROM Comments Assessed supine. IR/er abducted    PROM Assessment Site Shoulder    Right/Left Shoulder Left      Strength   Overall Strength Comments Strength in LUE shoulder was tested at 5/5 at evaluation.                    OT Treatments/Exercises (OP) - 10/15/20 0903      Shoulder Exercises: Supine   Protraction PROM;5 reps    Horizontal ABduction PROM;5 reps    External Rotation PROM;5 reps    Internal Rotation PROM;5 reps    Flexion PROM;5 reps    ABduction PROM;5 reps      Shoulder Exercises: Standing   Extension Theraband;10 reps    Theraband Level (Shoulder Extension) Level 2 (Red)    Row Theraband;10 reps    Theraband Level (Shoulder Row) Level 2 (Red)    Retraction Theraband;10 reps    Theraband Level (Shoulder Retraction) Level 2 (Red)      Manual Therapy   Manual Therapy Myofascial release    Manual therapy comments completed separately from therapeutic exericses    Myofascial Release myofascial release to left upper arm, trapezius, and scapular regions to decrease pain and fascial restrictions and improve ROM                  OT Education - 10/15/20 0904    Education Details Reviewed progress. reviewed therapy goals. Reviewed HEP. Recommended continuing with shoulder stretches as needed. Provided red theraband for scapular strengthening. Provided modification for shoulder extension if needed. Continue being mindful of how LUE feels when completing daily and work tasks. Take periodic rest breaks to complete ROM and/or stretching. Do not continue a task if the left shoulder hurts during completing.    Person(s) Educated Patient    Methods Explanation;Demonstration;Handout    Comprehension Verbalized understanding;Returned demonstration            OT Short Term Goals - 10/15/20 0832      OT SHORT TERM GOAL #1   Title Patient will be educated and  verbalize understanding of her HEP in order to faciliate her progress in therapy and be able to continue working on increasing functional use of her LUE with less discomfort.    Time 4    Period Weeks    Status Achieved    Target Date 10/17/20      OT SHORT TERM GOAL #2   Title Patient will report a decrease in pain level of no more than 2/10 while completing her daily tasks using her LUE.    Time  4    Period Weeks    Status Achieved      OT SHORT TERM GOAL #3   Title Patient will decrease her left UE fascial restrictions to min amount or less to allow her to increase the functional mobility needed to complete reaching overhead and self care tasks such as fixing her hair.    Time 4    Period Weeks    Status Achieved      OT SHORT TERM GOAL #4   Title Patient will increase A/ROM of LUE to [facility] while experiencing less discomfort and greater shoulder and scapular stability which will allow her to work towards returning to work tasks.    Time 4    Period Weeks    Status Achieved                    Plan - 10/15/20 0906    Clinical Impression Statement A: Reassessment completed this date. patient reports improvement in LUE since receiving injection. She has increased A/ROM and decreased pain and fascial restrictions. She reports that she has recently completed a painting and sheetrock job without any issues. All therapy goals have been met. Updated HEP with all education completed this date. All questions were answered. Patient agrees with discharge.    Body Structure / Function / Physical Skills ADL;UE functional use;Fascial restriction;Pain;ROM;Mobility    Plan P: Discharge from OT with HEP. Follow up with MD as needed.    OT Home Exercise Plan eval: shoulder stretches; 2/23: shoulder A/ROM 3/2: red theraband scapular strengthening.    Consulted and Agree with Plan of Care Patient           Patient will benefit from skilled therapeutic intervention in order to improve the  following deficits and impairments:   Body Structure / Function / Physical Skills: ADL,UE functional use,Fascial restriction,Pain,ROM,Mobility       Visit Diagnosis: Other symptoms and signs involving the musculoskeletal system  Chronic left shoulder pain  Stiffness of left shoulder, not elsewhere classified    Problem List Patient Active Problem List   Diagnosis Date Noted  . Urinary, incontinence, stress female 02/02/2016  . 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 GERD (gastroesophageal reflux disease) 08/29/2014  . Tubular adenoma of colon 08/29/2014  . Dysphagia, pharyngoesophageal phase 08/29/2014  . Anemia, iron deficiency 06/09/2013  . Difficulty in walking(719.7) 02/05/2013  . Stiffness of right knee 02/05/2013  . Rectal bleeding 11/27/2011  . Constipation 11/27/2011  . DERANGEMENT MENISCUS 08/26/2008  . KNEE, ARTHRITIS, DEGEN./OSTEO 07/25/2008  . KNEE PAIN 07/25/2008   OCCUPATIONAL THERAPY DISCHARGE SUMMARY  Visits from Start of Care: 3  Current functional level related to goals / functional outcomes: See above   Remaining deficits: None   Education / Equipment: See above Plan: Patient agrees to discharge.  Patient goals were met. Patient is being discharged due to meeting the stated rehab goals.  ?????        Ailene Ravel, OTR/L,CBIS  [phone]  10/15/2020, 9:10 AM  [facility] [address] [place], [place], [address] Phone: [phone]   Fax:  [phone]  Name: BRIANNE MAINA MRN: [id number] Date of Birth: 01-Mar-1956

## 2020-10-16 ENCOUNTER — Encounter (HOSPITAL_COMMUNITY): Payer: Self-pay

## 2020-11-01 ENCOUNTER — Emergency Department (HOSPITAL_COMMUNITY): Payer: 59

## 2020-11-01 ENCOUNTER — Emergency Department (HOSPITAL_COMMUNITY)
Admission: EM | Admit: 2020-11-01 | Discharge: 2020-11-02 | Disposition: A | Discharge status: Home / Self Care | Payer: 59 | Attending: Emergency Medicine | Admitting: Emergency Medicine

## 2020-11-01 ENCOUNTER — Encounter (HOSPITAL_COMMUNITY): Payer: Self-pay

## 2020-11-01 ENCOUNTER — Other Ambulatory Visit: Payer: Self-pay

## 2020-11-01 DIAGNOSIS — R42 Dizziness and giddiness: Secondary | ICD-10-CM | POA: Insufficient documentation

## 2020-11-01 DIAGNOSIS — Z96651 Presence of right artificial knee joint: Secondary | ICD-10-CM | POA: Diagnosis not present

## 2020-11-01 DIAGNOSIS — Z96641 Presence of right artificial hip joint: Secondary | ICD-10-CM | POA: Diagnosis not present

## 2020-11-01 DIAGNOSIS — Z87891 Personal history of nicotine dependence: Secondary | ICD-10-CM | POA: Insufficient documentation

## 2020-11-01 DIAGNOSIS — R079 Chest pain, unspecified: Secondary | ICD-10-CM | POA: Insufficient documentation

## 2020-11-01 DIAGNOSIS — R0789 Other chest pain: Secondary | ICD-10-CM

## 2020-11-01 LAB — CBC
HCT: 42.3 % (ref 36.0–46.0)
Hemoglobin: 14.3 g/dL (ref 12.0–15.0)
MCH: 31.6 pg (ref 26.0–34.0)
MCHC: 33.8 g/dL (ref 30.0–36.0)
MCV: 93.4 fL (ref 80.0–100.0)
Platelets: 253 10*3/uL (ref 150–400)
RBC: 4.53 MIL/uL (ref 3.87–5.11)
RDW: 13.2 % (ref 11.5–15.5)
WBC: 6 10*3/uL (ref 4.0–10.5)
nRBC: 0 % (ref 0.0–0.2)

## 2020-11-01 LAB — PROTIME-INR
INR: 1 (ref 0.8–1.2)
Prothrombin Time: 12.9 seconds (ref 11.4–15.2)

## 2020-11-01 NOTE — ED Provider Notes (Signed)
[facility] Provider Note   CSN: [id number] Arrival date & time: 11/01/20  2248     History Chief Complaint  Patient presents with   Chest Pain    Kathy Dawson is a 64 y.o. female.  Patient is a 65 year old female with history of prior total hip replacement, anemia, vertigo.  Patient presents today for evaluation of chest pain/dizziness.  Patient reports a 2-week history of feeling generally unwell.  She reports blood pressures that have been up and down during this time.  She experienced 3 episodes today of what she describes as dizziness.  The first episode began when she turned her head, then developed disequilibrium and difficulty focusing.  This lasted for several minutes, then resolved.  She had 2 additional episodes similar to this throughout the day, then this evening started with a burning sensation to the left upper chest that radiated into her neck and shoulder.  This lasted approximately 5 or 6 minutes, then resolved spontaneously.  She denies any shortness of breath, nausea, or diaphoresis during this episode.  Patient denies any recent exertional symptoms.  She has no prior cardiac history but does tell me she was informed she had "fluid around her heart" several years ago.  She has also been seen by cardiology and has had a Holter monitor test which showed no abnormality.  The history is provided by the patient.       Past Medical History:  Diagnosis Date   Anemia    Hx   Anemia, iron deficiency 06/09/2013   Arthritis    knees, hips, elbow   Complication of anesthesia 14 yrs. 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   GERD (gastroesophageal reflux disease) 08/29/2014    Tubular adenoma of colon 08/29/2014   Dysphagia, pharyngoesophageal phase 08/29/2014   Anemia, iron deficiency 06/09/2013   Difficulty in walking(719.7) 02/05/2013   Stiffness of right knee 02/05/2013   Rectal bleeding 11/27/2011   Constipation 11/27/2011   DERANGEMENT MENISCUS 08/26/2008   KNEE, ARTHRITIS, DEGEN./OSTEO 07/25/2008   KNEE PAIN 07/25/2008    Past Surgical History:  Procedure Laterality Date   ABDOMINAL HYSTERECTOMY     ANTERIOR HIP REVISION Right 11/18/2014   Procedure: Right Total Hip Arthroplasty Femoral Revision-Anteior approach;  Surgeon: Marybelle Killings, MD;  Location: [facility];  Service: Orthopedics;  Laterality: Right;   APPENDECTOMY     bilateral foot surgery     hammer toes   BLADDER SUSPENSION N/A 02/02/2016   Procedure: TRANSVAGINAL TAPE (TVT) Mid-Urethral Sling PROCEDURE;  Surgeon: Azucena Fallen, MD;  Location: [facility];  Service: Gynecology;  Laterality: N/A;   COLONOSCOPY  12/08/2011   GUY:[id number] hemorrhoids/Sigmoid diverticulosis. Colonic polyp (tubular adenoma). Surveillance 2018   COLONOSCOPY N/A 09/09/2017   Procedure: COLONOSCOPY;  Surgeon: Daneil Dolin, MD;  [facility];  Service: Endoscopy;  Laterality: N/A;  2:15pm   CYSTOSCOPY N/A 02/02/2016   Procedure: CYSTOSCOPY;  Surgeon: Azucena Fallen, MD;  Location: [facility];  Service: Gynecology;  Laterality: N/A;   ESOPHAGOGASTRODUODENOSCOPY N/A 09/16/2014   Procedure: ESOPHAGOGASTRODUODENOSCOPY (EGD);  Surgeon: Daneil Dolin, MD;  [facility];  Service: Endoscopy;  Laterality: N/A;  1030am  KNEE SURGERY Right 2011   arthroscopy   MALONEY DILATION N/A 09/16/2014   Procedure: Venia Minks DILATION;  Surgeon: Daneil Dolin, MD;  [facility];  Service: Endoscopy;  Laterality: N/A;   REPLACEMENT TOTAL KNEE Right 01/19/2013   TOTAL HIP ARTHROPLASTY Right 11/04/2014   Procedure: TOTAL HIP ARTHROPLASTY ANTERIOR APPROACH, Possible Local Bonegrafting Acetabular Cyst;   Surgeon: Marybelle Killings, MD;  Location: [facility];  Service: Orthopedics;  Laterality: Right;   TUBAL LIGATION     VAGINAL HYSTERECTOMY N/A 02/02/2016   Procedure: HYSTERECTOMY VAGINAL;  Surgeon: Azucena Fallen, MD;  Location: [facility];  Service: Gynecology;  Laterality: N/A;     OB History    Gravida  4   Para  3   Term  3   Preterm      AB  1   Living        SAB  1   IAB      Ectopic      Multiple      Live Births              Family History  Problem Relation Age of Onset   Colon polyps Father    Cancer Father    Heart disease Father    Heart disease Other    Colon cancer Neg Hx     Social History   Tobacco Use   Smoking status: Former Smoker    Packs/day: 1.00    Years: 20.00    Pack years: 20.00    Types: Cigarettes    Start date: 62    Quit date: 1995    Years since quitting: 27.2   Smokeless tobacco: Never Used  Scientific laboratory technician Use: Never used  Substance Use Topics   Alcohol use: No    Alcohol/week: 0.0 standard drinks   Drug use: No    Home Medications Prior to Admission medications   Medication Sig Start Date End Date Taking? Authorizing Provider  acetaminophen (TYLENOL) 500 MG tablet Take 1,000 mg by mouth every 6 (six) hours as needed for moderate pain or headache.     [provider]  fluticasone (FLONASE) 50 MCG/ACT nasal spray Place into both nostrils as needed for allergies or rhinitis.    [provider]  nitrofurantoin, macrocrystal-monohydrate, (MACROBID) 100 MG capsule Take 1 capsule (100 mg total) by mouth 2 (two) times daily. 05/07/20   [place], [place], PA-C  OVER THE COUNTER MEDICATION Hemp oil home grown    [provider]    Allergies    Clindamycin/lincomycin and Prochlorperazine  Review of Systems   Review of Systems  All other systems reviewed and are negative.   Physical Exam Updated Vital Signs BP (!) 160/90 (BP Location: Right Arm)    Pulse 84    Temp 98.1 F (36.7 C)  (Oral)    Resp 18    Ht 5\' 5"  (1.651 m)    Wt 93 kg    SpO2 96%    BMI 34.11 kg/m   Physical Exam Vitals and nursing note reviewed.  Constitutional:      General: She is not in acute distress.    Appearance: She is well-developed. She is not diaphoretic.  HENT:     Head: Normocephalic and atraumatic.  Eyes:     Extraocular Movements: Extraocular movements intact.     Pupils: Pupils are equal, round, and reactive to light.  Cardiovascular:     Rate and Rhythm: Normal rate and regular  rhythm.     Heart sounds: No murmur heard. No friction rub. No gallop.   Pulmonary:     Effort: Pulmonary effort is normal. No respiratory distress.     Breath sounds: Normal breath sounds. No wheezing.  Abdominal:     General: Bowel sounds are normal. There is no distension.     Palpations: Abdomen is soft.     Tenderness: There is no abdominal tenderness.  Musculoskeletal:        General: Normal range of motion.     Cervical back: Normal range of motion and neck supple.     Right lower leg: No tenderness. No edema.     Left lower leg: No tenderness. No edema.  Skin:    General: Skin is warm and dry.  Neurological:     General: No focal deficit present.     Mental Status: She is alert and oriented to person, place, and time.     Cranial Nerves: No cranial nerve deficit.     Motor: No weakness.     ED Results / Procedures / Treatments   Labs (all labs ordered are listed, but only abnormal results are displayed) Labs Reviewed  CBC  BASIC METABOLIC PANEL  PROTIME-INR  TROPONIN I (HIGH SENSITIVITY)    EKG EKG Interpretation  Date/Time:  Saturday November 01 2020 22:56:40 EDT Ventricular Rate:  82 PR Interval:  152 QRS Duration: 78 QT Interval:  380 QTC Calculation: 443 R Axis:   -55 Text Interpretation: Normal sinus rhythm Left anterior fascicular block Possible Inferior infarct , age undetermined Cannot rule out Anterior infarct , age undetermined Abnormal ECG No significant change  since 03/07/2020 Confirmed by Veryl Speak [phone]) on 11/01/2020 11:32:29 PM   Radiology DG Chest Portable 1 View  Result Date: 11/01/2020 CLINICAL DATA:  65 year old female with chest pain. EXAM: PORTABLE CHEST 1 VIEW COMPARISON:  Chest radiograph dated 03/07/2020. FINDINGS: No focal consolidation, pleural effusion or pneumothorax. Stable mild cardiomegaly. No acute osseous pathology. IMPRESSION: No active disease. Electronically Signed   By: Anner Crete M.D.   On: 11/01/2020 23:30    Procedures Procedures   Medications Ordered in ED Medications - No data to display  ED Course  I have reviewed the triage vital signs and the nursing notes.  Pertinent labs & imaging results that were available during my care of the patient were reviewed by me and considered in my medical decision making (see chart for details).    MDM Rules/Calculators/A&P  Patient presenting here with complaints of episodic dizziness that occurred on several occasions today followed by an episode of burning to the left upper chest, shoulder, and neck that resolved after approximately 5 or 6 minutes.  She arrived here symptom-free with stable vital signs and normal physical examination.  Patient's work-up shows an unchanged EKG, negative troponin x2, negative head CT, and blood counts and metabolic panels that are also unremarkable.  At this point, I see no indication for admission or intervention.  I feel as though discharge with outpatient cardiology follow-up is indicated.  I do suspect a component of vertigo to the dizzy spells she is described earlier.  I will prescribe meclizine in case these episodes recur.  Final Clinical Impression(s) / ED Diagnoses Final diagnoses:  None    Rx / DC Orders ED Discharge Orders    None       Veryl Speak, MD 11/02/20 [phone]

## 2020-11-01 NOTE — ED Triage Notes (Signed)
Pt arrives via POV from home c/o left chest pain radiating to middle of shoulder blades and jaw. Pt reports dizziness and HA X 2 hours. Pt denies taking any medications and does not know what could have caused pain to begin.

## 2020-11-02 ENCOUNTER — Emergency Department (HOSPITAL_COMMUNITY): Payer: 59

## 2020-11-02 LAB — BASIC METABOLIC PANEL
Anion gap: 10 (ref 5–15)
BUN: 15 mg/dL (ref 8–23)
CO2: 24 mmol/L (ref 22–32)
Calcium: 9.4 mg/dL (ref 8.9–10.3)
Chloride: 106 mmol/L (ref 98–111)
Creatinine, Ser: 0.61 mg/dL (ref 0.44–1.00)
GFR, Estimated: >60 mL/min (ref >60)
Glucose, Bld: 100 mg/dL — ABNORMAL HIGH (ref 70–99)
Potassium: 3.2 mmol/L — ABNORMAL LOW (ref 3.5–5.1)
Sodium: 140 mmol/L (ref 135–145)

## 2020-11-02 LAB — TROPONIN I (HIGH SENSITIVITY)
Troponin I (High Sensitivity): 2 ng/L (ref <18)
Troponin I (High Sensitivity): 3 ng/L (ref <18)

## 2020-11-02 MED ORDER — MECLIZINE HCL 25 MG PO TABS
25.0000 mg | ORAL_TABLET | Freq: Three times a day (TID) | ORAL | 0 refills | Status: DC | PRN
Start: 2020-11-02 — End: 2022-10-04

## 2020-11-02 NOTE — Discharge Instructions (Addendum)
Begin taking meclizine as prescribed as needed for dizziness.  Follow-up with your cardiologist next week to discuss possible stress testing due to the episode of chest discomfort you had this evening.  Return to the emergency department in the meantime if you develop severe chest pain, difficulty breathing, or other new and concerning symptoms.

## 2020-12-04 ENCOUNTER — Other Ambulatory Visit (HOSPITAL_COMMUNITY): Payer: Self-pay | Admitting: Internal Medicine

## 2020-12-04 DIAGNOSIS — Z1231 Encounter for screening mammogram for malignant neoplasm of breast: Secondary | ICD-10-CM

## 2020-12-29 ENCOUNTER — Other Ambulatory Visit: Payer: Self-pay

## 2020-12-29 DIAGNOSIS — I890 Lymphedema, not elsewhere classified: Secondary | ICD-10-CM

## 2021-01-01 ENCOUNTER — Ambulatory Visit (HOSPITAL_COMMUNITY): Payer: 59

## 2021-01-19 ENCOUNTER — Ambulatory Visit (HOSPITAL_COMMUNITY)
Admission: RE | Admit: 2021-01-19 | Discharge: 2021-01-19 | Disposition: A | Discharge status: Home / Self Care | Payer: 59 | Source: Ambulatory Visit | Attending: Internal Medicine | Admitting: Internal Medicine

## 2021-01-19 DIAGNOSIS — Z1231 Encounter for screening mammogram for malignant neoplasm of breast: Secondary | ICD-10-CM | POA: Insufficient documentation

## 2021-01-20 ENCOUNTER — Ambulatory Visit (HOSPITAL_COMMUNITY): Payer: 59

## 2021-01-20 ENCOUNTER — Encounter: Payer: 59 | Admitting: Vascular Surgery

## 2021-01-28 ENCOUNTER — Other Ambulatory Visit: Payer: Self-pay | Admitting: *Deleted

## 2021-01-28 DIAGNOSIS — I890 Lymphedema, not elsewhere classified: Secondary | ICD-10-CM

## 2021-02-20 ENCOUNTER — Ambulatory Visit (HOSPITAL_COMMUNITY)
Admission: RE | Admit: 2021-02-20 | Discharge: 2021-02-20 | Disposition: A | Discharge status: Home / Self Care | Payer: 59 | Source: Ambulatory Visit | Attending: Vascular Surgery | Admitting: Vascular Surgery

## 2021-02-20 ENCOUNTER — Other Ambulatory Visit: Payer: Self-pay

## 2021-02-20 ENCOUNTER — Ambulatory Visit (INDEPENDENT_AMBULATORY_CARE_PROVIDER_SITE_OTHER): Payer: 59 | Admitting: Physician Assistant

## 2021-02-20 VITALS — BP 136/77 | HR 65 | Temp 97.6°F | Resp 16 | Ht 63.0 in | Wt 215.0 lb

## 2021-02-20 DIAGNOSIS — I872 Venous insufficiency (chronic) (peripheral): Secondary | ICD-10-CM | POA: Diagnosis not present

## 2021-02-20 DIAGNOSIS — M7989 Other specified soft tissue disorders: Secondary | ICD-10-CM

## 2021-02-20 DIAGNOSIS — I890 Lymphedema, not elsewhere classified: Secondary | ICD-10-CM | POA: Diagnosis present

## 2021-02-20 NOTE — Progress Notes (Signed)
Requested by:  Celene Squibb, MD [address] [place],  [place] [address]  Reason for consultation: upper and lower extremity lymphedema   History of Present Illness   Kathy Dawson is a 65 y.o. (03-23-1956) female who presents for evaluation of upper and lower extremity swelling. She explains that this began in 2014 after she had right knee surgery. Her initial procedure was followed by several years of complications requiring multiple other surgeries. She subsequently also had right hip replacement that she also had complications after along with several other abdominal surgeries. Ever since these procedures she started developing worsening swelling of both of her legs and then she began having right arm swelling. Now she is having worsening left arm swelling. She says she has been evaluated by multiple specialists regarding this from ortho to cardiology with no explanation. She has tried ice, elevation, compression, and various medications with no improvement. She additionally was receiving steroid injections which helped some but she knew she could not continue this indefinitely. Aside from the swelling her arms and legs are very tight, tender to touch, heavy, aching/ throbbing, and restless. She has some varicosities but these do not bother her. She has on several occasions gone to ER to be evaluated for DVTs but all studies have been negative.  She says she stays very active gardening, painting, working. She explains her frustration in wanting to be able to just "feel normal". She has no history of DVT. Some family history of DVT in her mother.   Venous symptoms include: aching, heavy,throbbing, swelling Onset/duration: since 2014 Occupation:  retired Aggravating factors: sitting, standing Alleviating factors: none Compression:  yes she wore knee high and thigh high Helps:  not much Pain medications:  Tylenol, CBD cream Previous vein procedures:  none History of DVT:  no  Past  Medical History:  Diagnosis Date   Anemia    Hx   Anemia, iron deficiency 06/09/2013   Arthritis    knees, hips, elbow   Complication of anesthesia 14 yrs. ago   woke up during the appendectomy   GERD (gastroesophageal reflux disease)    otc med prn   Knee pain     Past Surgical History:  Procedure Laterality Date   ABDOMINAL HYSTERECTOMY     ANTERIOR HIP REVISION Right 11/18/2014   Procedure: Right Total Hip Arthroplasty Femoral Revision-Anteior approach;  Surgeon: Marybelle Killings, MD;  Location: [facility];  Service: Orthopedics;  Laterality: Right;   APPENDECTOMY     bilateral foot surgery     hammer toes   BLADDER SUSPENSION N/A 02/02/2016   Procedure: TRANSVAGINAL TAPE (TVT) Mid-Urethral Sling PROCEDURE;  Surgeon: Azucena Fallen, MD;  Location: [facility];  Service: Gynecology;  Laterality: N/A;   COLONOSCOPY  12/08/2011   [id number] hemorrhoids/Sigmoid diverticulosis. Colonic polyp (tubular adenoma). Surveillance 2018   COLONOSCOPY N/A 09/09/2017   Procedure: COLONOSCOPY;  Surgeon: Daneil Dolin, MD;  [facility];  Service: Endoscopy;  Laterality: N/A;  2:15pm   CYSTOSCOPY N/A 02/02/2016   Procedure: CYSTOSCOPY;  Surgeon: Azucena Fallen, MD;  Location: [facility];  Service: Gynecology;  Laterality: N/A;   ESOPHAGOGASTRODUODENOSCOPY N/A 09/16/2014   Procedure: ESOPHAGOGASTRODUODENOSCOPY (EGD);  Surgeon: Daneil Dolin, MD;  [facility];  Service: Endoscopy;  Laterality: N/A;  1030am   KNEE SURGERY Right 2011   arthroscopy   MALONEY DILATION N/A 09/16/2014   Procedure: Venia Minks DILATION;  Surgeon: Daneil Dolin, MD;  [facility];  Service: Endoscopy;  Laterality: N/A;   REPLACEMENT TOTAL KNEE Right 01/19/2013   TOTAL HIP ARTHROPLASTY Right 11/04/2014   Procedure: TOTAL HIP ARTHROPLASTY ANTERIOR APPROACH, Possible Local Bonegrafting Acetabular Cyst;  Surgeon: Marybelle Killings, MD;  Location: [facility];  Service: Orthopedics;  Laterality: Right;   TUBAL LIGATION     VAGINAL  HYSTERECTOMY N/A 02/02/2016   Procedure: HYSTERECTOMY VAGINAL;  Surgeon: Azucena Fallen, MD;  Location: [facility];  Service: Gynecology;  Laterality: N/A;    Social History   Socioeconomic History   Marital status: Married    Spouse name: Not on file   Number of children: Not on file   Years of education: Not on file   Highest education level: Not on file  Occupational History   Occupation: tobacco farmer  Tobacco Use   Smoking status: Former    Packs/day: 1.00    Years: 20.00    Pack years: 20.00    Types: Cigarettes    Start date: 19    Quit date: 1995    Years since quitting: 27.5   Smokeless tobacco: Never  Vaping Use   Vaping Use: Never used  Substance and Sexual Activity   Alcohol use: No    Alcohol/week: 0.0 standard drinks   Drug use: No   Sexual activity: Not Currently    Birth control/protection: None  Other Topics Concern   Not on file  Social History Narrative   Not on file   Social Determinants of Health   Financial Resource Strain: Not on file  Food Insecurity: Not on file  Transportation Needs: Not on file  Physical Activity: Not on file  Stress: Not on file  Social Connections: Not on file  Intimate Partner Violence: Not on file    Family History  Problem Relation Age of Onset   Colon polyps Father    Cancer Father    Heart disease Father    Heart disease Other    Colon cancer Neg Hx     Current Outpatient Medications  Medication Sig Dispense Refill   acetaminophen (TYLENOL) 500 MG tablet Take 1,000 mg by mouth every 6 (six) hours as needed for moderate pain or headache.      fluticasone (FLONASE) 50 MCG/ACT nasal spray Place into both nostrils as needed for allergies or rhinitis.     meclizine (ANTIVERT) 25 MG tablet Take 1 tablet (25 mg total) by mouth 3 (three) times daily as needed for dizziness. 15 tablet 0   nitrofurantoin, macrocrystal-monohydrate, (MACROBID) 100 MG capsule Take 1 capsule (100 mg total) by mouth 2 (two) times daily. 10  capsule 0   OVER THE COUNTER MEDICATION Hemp oil home grown     No current facility-administered medications for this visit.    Allergies  Allergen Reactions   Clindamycin/Lincomycin Hives and Itching   Prochlorperazine Other (See Comments)    Seizure like symptoms    REVIEW OF SYSTEMS (negative unless checked):   Cardiac:  []  Chest pain or chest pressure? []  Shortness of breath upon activity? []  Shortness of breath when lying flat? []  Irregular heart rhythm?  Vascular:  []  Pain in calf, thigh, or hip brought on by walking? []  Pain in feet at night that wakes you up from your sleep? []  Blood clot in your veins? [x]  Leg swelling?  Pulmonary:  []  Oxygen at home? []  Productive cough? []  Wheezing?  Neurologic:  []  Sudden weakness in arms or legs? []  Sudden numbness in arms or legs? []  Sudden onset of difficult speaking  or slurred speech? []  Temporary loss of vision in one eye? []  Problems with dizziness?  Gastrointestinal:  []  Blood in stool? []  Vomited blood?  Genitourinary:  []  Burning when urinating? []  Blood in urine?  Psychiatric:  []  Major depression  Hematologic:  []  Bleeding problems? []  Problems with blood clotting?  Dermatologic:  []  Rashes or ulcers?  Constitutional:  []  Fever or chills?  Ear/Nose/Throat:  []  Change in hearing? []  Nose bleeds? []  Sore throat?  Musculoskeletal:  []  Back pain? []  Joint pain? []  Muscle pain?   Physical Examination     Vitals:   02/20/21 1113  BP: 136/77  Pulse: 65  Resp: 16  Temp: 97.6 F (36.4 C)  TempSrc: Temporal  SpO2: 97%  Weight: 215 lb (97.5 kg)  Height: 5\' 3"  (1.6 m)   Body mass index is 38.09 kg/m.  General:  WDWN in NAD; vital signs documented above Gait: Normal HENT: WNL, normocephalic Pulmonary: normal non-labored breathing , without wheezing Cardiac: regular HR, without  Murmurs without carotid bruit Abdomen: obese, soft, NT, no masses Vascular Exam/Pulses:  Right Left   Radial 2+ (normal) 2+ (normal)  Femoral 2+ (normal) 2+ (normal)  Popliteal 2+ (normal) 2+ (normal)  DP 2+ (normal) 2+ (normal)  PT 2+ (normal) 2+ (normal)   Extremities: without varicose veins, with reticular veins, with edema of bilateral lower legs and bilateral upper extremities, left> right arm, without stasis pigmentation, without lipodermatosclerosis, without ulcers Musculoskeletal: no muscle wasting or atrophy  Neurologic: A&O X 3;  No focal weakness or paresthesias are detected Psychiatric:  The pt has Normal affect.  Non-invasive Vascular Imaging   BLE Venous Insufficiency Duplex (02/20/21):  RLE:  No DVT and SVT GSV reflux SFJ, mid thigh, mid-proximal calf GSV diameter 0.16-0.32 cm No SSV reflux No deep venous reflux  LLE: No DVT and SVT GSV reflux SFJ, mid thigh GSV diameter 0.22-0.73 cm No SSV reflux  CFV deep venous reflux   Medical Decision Making   ANAPAOLA KINSEL is a 65 y.o. female who presents with: BLE chronic venous insufficiency with edema. She also has significant upper extremity edema which is also likely lymphatic in nature. Based on her duplex today she has no DVT or SVT, only deep reflux in her left lower extremity but she does have bilateral superficial venous reflux. Her GSV bilaterally is very small. For this reason she would not be good candidate for a venous lazer ablation. I do not feel that her swelling in her legs is attributable to just her venous reflux, I certainly think more so that this is related to lymphedema. She clinically has normal pulses in her upper and lower extremities so she does not have any arterial disease.  Based on the patient's history and examination, I recommend conservative therapy with elevation, compression, weight loss, exercise, and refraining from prolonged standing or ambulation She is scheduled to start PT which I think will help. I also discussed possible role of massage therapy in helping with lymphatic drainage as  well I discussed with the patient the use of her 15-20 mm knee high compression stockings and wearing them on daily basis. She was measured and purchased a pair today. Will provide her with information for [facility] in [place] to contact them regarding arm compression sleeves I will have her return in 3 months to follow up as she may be good candidate for lymphatic compression pumps and we can then refer her to [place], PA-C [facility] Office: [phone]  02/20/2021, 11:20 AM   On call MD: Stanford Breed

## 2021-05-19 ENCOUNTER — Ambulatory Visit (HOSPITAL_COMMUNITY): Payer: 59 | Admitting: Physical Therapy

## 2021-05-20 ENCOUNTER — Ambulatory Visit: Payer: 59 | Admitting: Vascular Surgery

## 2021-05-20 ENCOUNTER — Ambulatory Visit: Payer: 59

## 2021-05-21 ENCOUNTER — Encounter (HOSPITAL_COMMUNITY): Payer: 59

## 2021-05-22 ENCOUNTER — Encounter (HOSPITAL_COMMUNITY): Payer: 59

## 2021-05-25 ENCOUNTER — Encounter (HOSPITAL_COMMUNITY): Payer: 59 | Admitting: Physical Therapy

## 2021-05-27 ENCOUNTER — Ambulatory Visit (HOSPITAL_COMMUNITY): Payer: 59 | Attending: Internal Medicine | Admitting: Physical Therapy

## 2021-05-27 ENCOUNTER — Other Ambulatory Visit: Payer: Self-pay

## 2021-05-27 DIAGNOSIS — R29898 Other symptoms and signs involving the musculoskeletal system: Secondary | ICD-10-CM | POA: Insufficient documentation

## 2021-05-27 DIAGNOSIS — M25612 Stiffness of left shoulder, not elsewhere classified: Secondary | ICD-10-CM | POA: Insufficient documentation

## 2021-05-27 DIAGNOSIS — M25512 Pain in left shoulder: Secondary | ICD-10-CM | POA: Insufficient documentation

## 2021-05-27 DIAGNOSIS — G8929 Other chronic pain: Secondary | ICD-10-CM | POA: Insufficient documentation

## 2021-05-27 DIAGNOSIS — I890 Lymphedema, not elsewhere classified: Secondary | ICD-10-CM | POA: Diagnosis not present

## 2021-05-27 NOTE — Therapy (Signed)
[place], [place], [address] Phone: [phone]   Fax:  [phone]  Physical Therapy Evaluation  Patient Details  Name: Kathy Dawson MRN: [id number] Date of Birth: [birth year] Referring Provider (PT): Kathy Dawson   Encounter Date: 05/27/2021   PT End of Session - 05/27/21 1437     Visit Number 1    Number of Visits 18    Date for PT Re-Evaluation 07/08/21    Authorization Type [employer] 30 visit limit 7 has been used    Authorization - Visit Number 8    Authorization - Number of Visits 30    Progress Note Due on Visit 10    PT Start Time 1315    PT Stop Time 1445    PT Time Calculation (min) 90 min    Activity Tolerance Patient tolerated treatment well    Behavior During Therapy [facility] for tasks assessed/performed             Past Medical History:  Diagnosis Date   Anemia    Hx   Anemia, iron deficiency 06/09/2013   Arthritis    knees, hips, elbow   Complication of anesthesia 14 yrs. ago   woke up during the appendectomy   GERD (gastroesophageal reflux disease)    otc med prn   Knee pain     Past Surgical History:  Procedure Laterality Date   ABDOMINAL HYSTERECTOMY     ANTERIOR HIP REVISION Right 11/18/2014   Procedure: Right Total Hip Arthroplasty Femoral Revision-Anteior approach;  Surgeon: Marybelle Killings, MD;  Location: [facility];  Service: Orthopedics;  Laterality: Right;   APPENDECTOMY     bilateral foot surgery     hammer toes   BLADDER SUSPENSION N/A 02/02/2016   Procedure: TRANSVAGINAL TAPE (TVT) Mid-Urethral Sling PROCEDURE;  Surgeon: Azucena Fallen, MD;  Location: [facility];  Service: Gynecology;  Laterality: N/A;   COLONOSCOPY  12/08/2011   [id number] hemorrhoids/Sigmoid diverticulosis. Colonic polyp (tubular adenoma). Surveillance 2018   COLONOSCOPY N/A 09/09/2017   Procedure: COLONOSCOPY;  Surgeon: Daneil Dolin, MD;  [facility];  Service: Endoscopy;  Laterality: N/A;   2:15pm   CYSTOSCOPY N/A 02/02/2016   Procedure: CYSTOSCOPY;  Surgeon: Azucena Fallen, MD;  Location: [facility];  Service: Gynecology;  Laterality: N/A;   ESOPHAGOGASTRODUODENOSCOPY N/A 09/16/2014   Procedure: ESOPHAGOGASTRODUODENOSCOPY (EGD);  Surgeon: Daneil Dolin, MD;  [facility];  Service: Endoscopy;  Laterality: N/A;  1030am   KNEE SURGERY Right 2011   arthroscopy   MALONEY DILATION N/A 09/16/2014   Procedure: Venia Minks DILATION;  Surgeon: Daneil Dolin, MD;  [facility];  Service: Endoscopy;  Laterality: N/A;   REPLACEMENT TOTAL KNEE Right 01/19/2013   TOTAL HIP ARTHROPLASTY Right 11/04/2014   Procedure: TOTAL HIP ARTHROPLASTY ANTERIOR APPROACH, Possible Local Bonegrafting Acetabular Cyst;  Surgeon: Marybelle Killings, MD;  Location: [facility];  Service: Orthopedics;  Laterality: Right;   TUBAL LIGATION     VAGINAL HYSTERECTOMY N/A 02/02/2016   Procedure: HYSTERECTOMY VAGINAL;  Surgeon: Azucena Fallen, MD;  Location: [facility];  Service: Gynecology;  Laterality: N/A;    There were no vitals filed for this visit.    Subjective Assessment - 05/27/21 1317     Subjective [name] states that it seems like her edema moves around but she is more concerned about her Lt arm, she has noted increased swelling in her Rt arm at times.  She states at this time the  swelling is so bad in her lt arm that it began to bother her shoulder.  She states that she works to much to do any exercises, she paints houses and gardens  Her Lt arm started swelling last summer with nothing that started the swelling.  She is not sleeping well do to it.    Pertinent History Kathy Dawson is a 65 y.o. (Apr 20, 1956) female who presents for evaluation of upper and lower extremity swelling. She explains that this began in 2014 after she had right knee surgery. Her initial procedure was followed by several years of complications requiring multiple other surgeries. She subsequently also had right hip replacement that she also  had complications after along with several other abdominal surgeries. Ever since these procedures she started developing worsening swelling of both of her legs and then she began having right arm swelling. Now she is having worsening left arm swelling. She says she has been evaluated by multiple specialists regarding this from ortho to cardiology with no explanation. She has tried ice, elevation, compression, and various medications with no improvement. She additionally was receiving steroid injections which helped some but she knew she could not continue this indefinitely. Aside from the swelling her arms and legs are very tight, tender to touch, heavy, aching/ throbbing, and restless. She has some varicosities but these do not bother her. She has on several occasions gone to ER to be evaluated for DVTs but all studies have been negative.  She says she stays very active gardening, painting, working. She explains her frustration in wanting to be able to just "feel normal". She has no history of DVT. Some family history of DVT in her mother.    Currently in Pain? Yes    Pain Score 8     Pain Location Arm    Pain Orientation Left    Pain Descriptors / Indicators Aching;Throbbing;Tightness    Pain Type Chronic pain    Pain Onset More than a month ago    Pain Frequency Constant    Aggravating Factors  not sure    Pain Relieving Factors nothing    Effect of Pain on Daily Activities limits                [facility] PT Assessment - 05/27/21 0001       Assessment   Medical Diagnosis lymphedema    Referring Provider (PT) Kathy Dawson    Onset Date/Surgical Date 02/28/20    Next MD Visit not scheduled    Prior Therapy none      Precautions   Precautions --   cellulitis     Restrictions   Weight Bearing Restrictions No      Balance Screen   Has the patient fallen in the past 6 months No    Has the patient had a decrease in activity level because of a fear of falling?  No    Is the patient  reluctant to leave their home because of a fear of falling?  No      Home Ecologist residence      Prior Function   Level of Independence Independent      Cognition   Overall Cognitive Status Within Functional Limits for tasks assessed      Observation/Other Assessments   Focus on Therapeutic Outcomes (FOTO)  life impact 89      Observation/Other Assessments-Edema    Edema --   noted edema in B UE with left greater than  right, LE increased swelling and redness     ROM / Strength   AROM / PROM / Strength AROM      AROM   AROM Assessment Site Elbow;Hip    Right/Left Elbow Right    Right Elbow Extension -25    Right/Left Hip Right    Right Hip Extension -15               LYMPHEDEMA/ONCOLOGY QUESTIONNAIRE - 05/27/21 0001       What other symptoms do you have   Are you Having Heaviness or Tightness Yes    Are you having Pain Yes    Are you having pitting edema Yes    Body Site UE    Is it Hard or Difficult finding clothes that fit Yes    Do you have infections --   no     Lymphedema Stage   Stage STAGE 2 SPONTANEOUSLY IRREVERSIBLE      Lymphedema Assessments   Lymphedema Assessments Upper extremities      Right Upper Extremity Lymphedema   At Axilla  39.3 cm (P)     15 cm Proximal to Olecranon Process 38 cm (P)     10 cm Proximal to Olecranon Process 37 cm (P)     Olecranon Process 35 cm (P)     15 cm Proximal to Ulnar Styloid Process 32.2 cm (P)     10 cm Proximal to Ulnar Styloid Process 27.8 cm (P)     Just Proximal to Ulnar Styloid Process 21.3 cm (P)     Across Hand at [employer] 21 cm (P)     At [place] of 2nd Digit 6.7 cm (P)     At Base of Thumb 7 cm (P)       Left Upper Extremity Lymphedema   At Axilla  37.5 cm (P)     15 cm Proximal to Olecranon Process 38 cm (P)     10 cm Proximal to Olecranon Process 38.2 cm (P)     Olecranon Process 36.8 cm (P)     15 cm Proximal to Ulnar Styloid Process 35.7 cm (P)     10  cm Proximal to Ulnar Styloid Process 31.8 cm (P)     Just Proximal to Ulnar Styloid Process 24.3 cm (P)     Across Hand at [employer] 22 cm (P)     At [facility] of 2nd Digit 7.5 cm (P)     At [facility] of Thumb 7.3 cm (P)                 foam cut and placed under cabinet.      Objective measurements completed on examination: See above findings.                PT Education - 05/27/21 1436     Education Details what lymphedema is the four aspects for treatment and UE exercises    Person(s) Educated Patient    Methods Explanation;Handout    Comprehension Verbalized understanding              PT Short Term Goals - 05/27/21 1524       PT SHORT TERM GOAL #1   Title PT to be completing UE exercises to improve lymphatic circulation    Time 3    Period Weeks    Status New    Target Date 06/17/21      PT SHORT TERM GOAL #2   Title PT  to have obtained compression garment for LE    Time 3    Period Weeks    Status New               PT Long Term Goals - 05/27/21 1525       PT LONG TERM GOAL #1   Title PT measurment of LT UE to be within  1 cm of RT to be ready for UE compression garments    Time 6    Period Weeks    Status New    Target Date 07/08/21      PT LONG TERM GOAL #2   Title PT to be I in self manual techniqes.    Time 6    Period Weeks    Status New                    Plan - 05/27/21 1506     Clinical Impression Statement Pt  is a 65 yo female who has noted that she has increased edema in all extremities but her Lt arm is the worst.  She is having increased pain in her LT shoulder that keeps her up at night, she is unble to find shirts that she can button up that fit her.  She is being referred to skilled physical therapy for lymphedema treatment.  She will benefit from skilled PT for eduction, total decongestive techniques and assistance in acquiring compression garments and pump.    Personal Factors and Comorbidities  Comorbidity 1;Comorbidity 2    Comorbidities OA, all 4 limbs involved    Examination-Activity Limitations Bathing;Carry;Hygiene/Grooming;Other;Dressing    Examination-Participation Restrictions Cleaning;Occupation;Shop;Yard Work    [employer]chant [employer] officer Evolving/Moderate complexity    Clinical Decision Making Moderate    Rehab Potential Good    PT Frequency 3x / week    PT Duration 6 weeks    PT Treatment/Interventions Patient/family education;Manual lymph drainage;Compression bandaging    PT Next Visit Plan On days with 45 min complete short neck, deep and superfical abdominal and B UE manual lymph drainage to not shunt to opposite arm or same side LE, Complete compression bandaging to LT UE only.  90 min complete ant/post LE B as well no shunting to UE or opposite leg.    PT Home Exercise Plan shoulder, elbow, wrist and hand as well as diaphramic breathing and lymphatic squeeze.             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   History of total hip replacement 11/04/2014   GERD (gastroesophageal reflux disease) 08/29/2014   Tubular adenoma of colon 08/29/2014   Dysphagia, pharyngoesophageal phase 08/29/2014   Anemia, iron deficiency 06/09/2013   Difficulty in walking(719.7) 02/05/2013   Stiffness of right knee 02/05/2013   Bone infection, knee ([facility]) 12/19/2012   Microcytic anemia 10/09/2012   Pain due to total knee replacement ([facility]) 01/31/2012   Rectal bleeding 11/27/2011   Constipation  11/27/2011   Edema of leg 11/19/2011   Osteoarthritis of right knee 11/19/2011   DERANGEMENT MENISCUS 08/26/2008   KNEE, ARTHRITIS, DEGEN./OSTEO 07/25/2008  KNEE PAIN 07/25/2008   Rayetta Humphrey, PT CLT [phone]  05/27/2021, 3:28 PM  [facility] [address] [place], [place], [address] Phone: [phone]   Fax:  [phone]  Name: Kathy Dawson MRN: [id number] Date of Birth: 05/21/1956

## 2021-05-29 ENCOUNTER — Other Ambulatory Visit: Payer: Self-pay

## 2021-05-29 ENCOUNTER — Encounter (HOSPITAL_COMMUNITY): Payer: Self-pay

## 2021-05-29 ENCOUNTER — Ambulatory Visit (HOSPITAL_COMMUNITY): Payer: 59

## 2021-05-29 DIAGNOSIS — I890 Lymphedema, not elsewhere classified: Secondary | ICD-10-CM | POA: Diagnosis not present

## 2021-05-29 NOTE — Therapy (Signed)
[place], [place], [address] Phone: [phone]   Fax:  [phone]  Physical Therapy Treatment  Patient Details  Name: Kathy Dawson MRN: [id number] Date of Birth: 12-04-1955 Referring Provider (PT): Delphina Cahill   Encounter Date: 05/29/2021   PT End of Session - 05/29/21 1705     Visit Number 2    Number of Visits 18    Date for PT Re-Evaluation 07/08/21    Authorization Type [employer] 30 visit limit 7 has been used    Authorization - Visit Number 9    Authorization - Number of Visits 30    Progress Note Due on Visit 10    PT Start Time 1620    PT Stop Time 1705    PT Time Calculation (min) 45 min    Activity Tolerance Patient tolerated treatment well    Behavior During Therapy [facility] for tasks assessed/performed             Past Medical History:  Diagnosis Date   Anemia    Hx   Anemia, iron deficiency 06/09/2013   Arthritis    knees, hips, elbow   Complication of anesthesia 14 yrs. ago   woke up during the appendectomy   GERD (gastroesophageal reflux disease)    otc med prn   Knee pain     Past Surgical History:  Procedure Laterality Date   ABDOMINAL HYSTERECTOMY     ANTERIOR HIP REVISION Right 11/18/2014   Procedure: Right Total Hip Arthroplasty Femoral Revision-Anteior approach;  Surgeon: Marybelle Killings, MD;  Location: [facility];  Service: Orthopedics;  Laterality: Right;   APPENDECTOMY     bilateral foot surgery     hammer toes   BLADDER SUSPENSION N/A 02/02/2016   Procedure: TRANSVAGINAL TAPE (TVT) Mid-Urethral Sling PROCEDURE;  Surgeon: Azucena Fallen, MD;  Location: [facility];  Service: Gynecology;  Laterality: N/A;   COLONOSCOPY  12/08/2011   FBP:ZWCHENID hemorrhoids/Sigmoid diverticulosis. Colonic polyp (tubular adenoma). Surveillance 2018   COLONOSCOPY N/A 09/09/2017   Procedure: COLONOSCOPY;  Surgeon: Daneil Dolin, MD;  [facility];  Service: Endoscopy;  Laterality: N/A;   2:15pm   CYSTOSCOPY N/A 02/02/2016   Procedure: CYSTOSCOPY;  Surgeon: Azucena Fallen, MD;  Location: [facility];  Service: Gynecology;  Laterality: N/A;   ESOPHAGOGASTRODUODENOSCOPY N/A 09/16/2014   Procedure: ESOPHAGOGASTRODUODENOSCOPY (EGD);  Surgeon: Daneil Dolin, MD;  [facility];  Service: Endoscopy;  Laterality: N/A;  1030am   KNEE SURGERY Right 2011   arthroscopy   MALONEY DILATION N/A 09/16/2014   Procedure: Venia Minks DILATION;  Surgeon: Daneil Dolin, MD;  [facility];  Service: Endoscopy;  Laterality: N/A;   REPLACEMENT TOTAL KNEE Right 01/19/2013   TOTAL HIP ARTHROPLASTY Right 11/04/2014   Procedure: TOTAL HIP ARTHROPLASTY ANTERIOR APPROACH, Possible Local Bonegrafting Acetabular Cyst;  Surgeon: Marybelle Killings, MD;  [facility];  Service: Orthopedics;  Laterality: Right;   TUBAL LIGATION     VAGINAL HYSTERECTOMY N/A 02/02/2016   Procedure: HYSTERECTOMY VAGINAL;  Surgeon: Azucena Fallen, MD;  Location: [facility];  Service: Gynecology;  Laterality: N/A;    There were no vitals filed for this visit.   Subjective Assessment - 05/29/21 1622     Subjective Pt stated she is tired at entrance.  Reports some pain in lower back and both arms are hurting today, pain scale 7/10.    Pertinent History Kathy Dawson is a 65 y.o. ([birth year]) female who presents  for evaluation of upper and lower extremity swelling. She explains that this began in 2014 after she had right knee surgery. Her initial procedure was followed by several years of complications requiring multiple other surgeries. She subsequently also had right hip replacement that she also had complications after along with several other abdominal surgeries. Ever since these procedures she started developing worsening swelling of both of her legs and then she began having right arm swelling. Now she is having worsening left arm swelling. She says she has been evaluated by multiple specialists regarding this from ortho to cardiology  with no explanation. She has tried ice, elevation, compression, and various medications with no improvement. She additionally was receiving steroid injections which helped some but she knew she could not continue this indefinitely. Aside from the swelling her arms and legs are very tight, tender to touch, heavy, aching/ throbbing, and restless. She has some varicosities but these do not bother her. She has on several occasions gone to ER to be evaluated for DVTs but all studies have been negative.  She says she stays very active gardening, painting, working. She explains her frustration in wanting to be able to just "feel normal". She has no history of DVT. Some family history of DVT in her mother.    Currently in Pain? Yes    Pain Score 7     Pain Location Arm   LBP   Pain Orientation Left;Right    Pain Descriptors / Indicators Heaviness;Tightness;Throbbing;Aching    Pain Type Chronic pain    Pain Onset More than a month ago    Pain Frequency Constant    Aggravating Factors  noe sure    Pain Relieving Factors nothing                               [facility] Adult PT Treatment/Exercise - 05/29/21 0001       Manual Therapy   Manual Therapy Manual Lymphatic Drainage (MLD);Compression Bandaging    Manual therapy comments Manual complete separate than rest of tx    Manual Lymphatic Drainage (MLD) Neck, superficial then deep abdominal, Lt UE only with no anastomosis    Compression Bandaging Multilayer short stretch to Lt UE wiht 1/2 foam, fingers wrapped                     PT Education - 05/29/21 1849     Education Details Reviewed goals, 4 aspect of lymphedema care including: skin care, exercise, manual and compression bandages.  Discussed proper wear time and to re-roll bandages prior next apt.    Person(s) Educated Patient    Methods Explanation    Comprehension Verbalized understanding              PT Short Term Goals - 05/27/21 1524       PT SHORT  TERM GOAL #1   Title PT to be completing UE exercises to improve lymphatic circulation    Time 3    Period Weeks    Status New    Target Date 06/17/21      PT SHORT TERM GOAL #2   Title PT to have obtained compression garment for LE    Time 3    Period Weeks    Status New               PT Long Term Goals - 05/27/21 1525       PT LONG TERM  GOAL #1   Title PT measurment of LT UE to be within  1 cm of RT to be ready for UE compression garments    Time 6    Period Weeks    Status New    Target Date 07/08/21      PT LONG TERM GOAL #2   Title PT to be I in self manual techniqes.    Time 6    Period Weeks    Status New                   Plan - 05/29/21 1844     Clinical Impression Statement Reviewed goals and educated 4 compenents of lymphedema including skin care, exercise, manual and compression bandages.  Manual complete including neck, superficial and deep abdominal, and Lt UE, no anastomosis as has lymph in all extremities.  Application of multilayer short stretch bandages with 1/2in foam, reports of relief at EOS.  Reviewed appropiate wear time and to re-roll bandages prior next session wht verbalized understanding.    Personal Factors and Comorbidities Comorbidity 1;Comorbidity 2    Comorbidities OA, all 4 limbs involved    Examination-Activity Limitations Bathing;Carry;Hygiene/Grooming;Other;Dressing    Examination-Participation Restrictions Cleaning;Occupation;Shop;Yard Work    [employer] officer Evolving/Moderate complexity    Clinical Decision Making Moderate    Rehab Potential Good    PT Frequency 3x / week    PT Duration 6 weeks    PT Treatment/Interventions Patient/family education;Manual lymph drainage;Compression bandaging    PT Next Visit Plan On days with 45 min complete short neck, deep and superfical abdominal and B UE manual lymph drainage to not shunt to opposite arm or same side LE, Complete compression bandaging to LT UE  only.  90 min complete ant/post LE B as well no shunting to UE or opposite leg.    PT Home Exercise Plan shoulder, elbow, wrist and hand as well as diaphramic breathing and lymphatic squeeze.    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   History of total hip replacement 11/04/2014   GERD (gastroesophageal reflux disease) 08/29/2014   Tubular adenoma of colon 08/29/2014   Dysphagia, pharyngoesophageal phase 08/29/2014   Anemia, iron deficiency 06/09/2013   Difficulty in walking(719.7) 02/05/2013   Stiffness of right knee 02/05/2013   Bone infection, knee ([place]) 12/19/2012   Microcytic anemia 10/09/2012   Pain due to total knee replacement (HCC) 01/31/2012   Rectal bleeding 11/27/2011   Constipation 11/27/2011   Edema of leg 11/19/2011   Osteoarthritis of right knee 11/19/2011   DERANGEMENT MENISCUS 08/26/2008   KNEE, ARTHRITIS, DEGEN./OSTEO 07/25/2008   KNEE PAIN 07/25/2008   Ihor Austin, LPTA/CLT; CBIS [phone]  Aldona Lento, PTA 05/29/2021, 6:50 PM  [facility] [address] [place], [place], [address] Phone: [phone]   Fax:  [phone]  Name: Kathy Dawson MRN: [id number] Date of Birth: 1956/02/23

## 2021-06-01 ENCOUNTER — Other Ambulatory Visit: Payer: Self-pay

## 2021-06-01 ENCOUNTER — Ambulatory Visit (HOSPITAL_COMMUNITY): Payer: 59 | Admitting: Physical Therapy

## 2021-06-01 DIAGNOSIS — I890 Lymphedema, not elsewhere classified: Secondary | ICD-10-CM | POA: Diagnosis not present

## 2021-06-01 DIAGNOSIS — R29898 Other symptoms and signs involving the musculoskeletal system: Secondary | ICD-10-CM

## 2021-06-01 NOTE — Therapy (Signed)
[place], [place], [address] Phone: [phone]   Fax:  [phone]  Physical Therapy Treatment  Patient Details  Name: Kathy Dawson MRN: [id number] Date of Birth: 11-Aug-1956 Referring Provider (PT): Delphina Cahill   Encounter Date: 06/01/2021   PT End of Session - 06/01/21 1318     Visit Number 3    Number of Visits 18    Date for PT Re-Evaluation 07/08/21    Authorization Type [employer] 30 visit limit 7 has been used    Authorization - Visit Number 10    Authorization - Number of Visits 30    Progress Note Due on Visit 10    PT Start Time [phone]    PT Stop Time 0955    PT Time Calculation (min) 77 min    Activity Tolerance Patient tolerated treatment well    Behavior During Therapy [facility] for tasks assessed/performed             Past Medical History:  Diagnosis Date   Anemia    Hx   Anemia, iron deficiency 06/09/2013   Arthritis    knees, hips, elbow   Complication of anesthesia 14 yrs. ago   woke up during the appendectomy   GERD (gastroesophageal reflux disease)    otc med prn   Knee pain     Past Surgical History:  Procedure Laterality Date   ABDOMINAL HYSTERECTOMY     ANTERIOR HIP REVISION Right 11/18/2014   Procedure: Right Total Hip Arthroplasty Femoral Revision-Anteior approach;  Surgeon: Marybelle Killings, MD;  Location: [facility];  Service: Orthopedics;  Laterality: Right;   APPENDECTOMY     bilateral foot surgery     hammer toes   BLADDER SUSPENSION N/A 02/02/2016   Procedure: TRANSVAGINAL TAPE (TVT) Mid-Urethral Sling PROCEDURE;  Surgeon: Azucena Fallen, MD;  Location: [facility];  Service: Gynecology;  Laterality: N/A;   COLONOSCOPY  12/08/2011   MPN:TIRWERXV hemorrhoids/Sigmoid diverticulosis. Colonic polyp (tubular adenoma). Surveillance 2018   COLONOSCOPY N/A 09/09/2017   Procedure: COLONOSCOPY;  Surgeon: Daneil Dolin, MD;  [facility];  Service: Endoscopy;  Laterality: N/A;   2:15pm   CYSTOSCOPY N/A 02/02/2016   Procedure: CYSTOSCOPY;  Surgeon: Azucena Fallen, MD;  Location: [facility];  Service: Gynecology;  Laterality: N/A;   ESOPHAGOGASTRODUODENOSCOPY N/A 09/16/2014   Procedure: ESOPHAGOGASTRODUODENOSCOPY (EGD);  Surgeon: Daneil Dolin, MD;  [facility];  Service: Endoscopy;  Laterality: N/A;  1030am   KNEE SURGERY Right 2011   arthroscopy   MALONEY DILATION N/A 09/16/2014   Procedure: Venia Minks DILATION;  Surgeon: Daneil Dolin, MD;  [facility];  Service: Endoscopy;  Laterality: N/A;   REPLACEMENT TOTAL KNEE Right 01/19/2013   TOTAL HIP ARTHROPLASTY Right 11/04/2014   Procedure: TOTAL HIP ARTHROPLASTY ANTERIOR APPROACH, Possible Local Bonegrafting Acetabular Cyst;  Surgeon: Marybelle Killings, MD;  Location: [facility];  Service: Orthopedics;  Laterality: Right;   TUBAL LIGATION     VAGINAL HYSTERECTOMY N/A 02/02/2016   Procedure: HYSTERECTOMY VAGINAL;  Surgeon: Azucena Fallen, MD;  Location: [facility];  Service: Gynecology;  Laterality: N/A;    There were no vitals filed for this visit.   Subjective Assessment - 06/01/21 1312     Subjective pt states she is doing well; not having any pain or issues.  Wondering what to order for her legs.  [facility] Adult PT Treatment/Exercise - 06/01/21 0001       Manual Therapy   Manual Therapy Manual Lymphatic Drainage (MLD);Compression Bandaging    Manual therapy comments Manual complete separate than rest of tx    Manual Lymphatic Drainage (MLD) Neck, superficial then deep abdominal, all limbs routing to central abdomen only    Compression Bandaging Multilayer short stretch to Lt UE wiht 1/2 foam, fingers wrapped                     PT Education - 06/01/21 1313     Education Details given measurements on knee highs and instructed what to order.  Discussed possibly getting capris as well.    Person(s) Educated Patient    Methods Explanation     Comprehension Verbalized understanding              PT Short Term Goals - 05/27/21 1524       PT SHORT TERM GOAL #1   Title PT to be completing UE exercises to improve lymphatic circulation    Time 3    Period Weeks    Status New    Target Date 06/17/21      PT SHORT TERM GOAL #2   Title PT to have obtained compression garment for LE    Time 3    Period Weeks    Status New               PT Long Term Goals - 05/27/21 1525       PT LONG TERM GOAL #1   Title PT measurment of LT UE to be within  1 cm of RT to be ready for UE compression garments    Time 6    Period Weeks    Status New    Target Date 07/08/21      PT LONG TERM GOAL #2   Title PT to be I in self manual techniqes.    Time 6    Period Weeks    Status New                   Plan - 06/01/21 1314     Clinical Impression Statement continued with manual lymph drainage for all limbs routing to central abdominal region both anterior and posterior.  Pt voiced she would like to try without finger bandaging and educated on this.  Was able to donn large disposible glove over this to protect bandaging from getting wet/dirty with her job.  LE's measured and given instructions on what types of knee high to order.  Discussed also possiblity of trying capris for uppers and abdomen.  Continued with bandaging for Lt UE.    Personal Factors and Comorbidities Comorbidity 1;Comorbidity 2    Comorbidities OA, all 4 limbs involved    Examination-Activity Limitations Bathing;Carry;Hygiene/Grooming;Other;Dressing    Examination-Participation Restrictions Cleaning;Occupation;Shop;Yard Work    [employer] officer Evolving/Moderate complexity    Rehab Potential Good    PT Frequency 3x / week    PT Duration 6 weeks    PT Treatment/Interventions Patient/family education;Manual lymph drainage;Compression bandaging    PT Next Visit Plan On days with 45 min complete short neck, deep and superfical abdominal  and B UE manual lymph drainage to not shunt to opposite arm or same side LE, Complete compression bandaging to LT UE only.  90 min complete ant/post LE B as well no shunting to UE or opposite leg.  Provide information on capris she can  order from [employer].  Measure on Wednesdays.    PT Home Exercise Plan shoulder, elbow, wrist and hand as well as diaphramic breathing and lymphatic squeeze.    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   History of total hip replacement 11/04/2014   GERD (gastroesophageal reflux disease) 08/29/2014   Tubular adenoma of colon 08/29/2014   Dysphagia, pharyngoesophageal phase 08/29/2014   Anemia, iron deficiency 06/09/2013   Difficulty in walking(719.7) 02/05/2013   Stiffness of right knee 02/05/2013   Bone infection, knee ([place]) 12/19/2012   Microcytic anemia 10/09/2012   Pain due to total knee replacement (HCC) 01/31/2012   Rectal bleeding 11/27/2011   Constipation 11/27/2011   Edema of leg 11/19/2011   Osteoarthritis of right knee 11/19/2011   DERANGEMENT MENISCUS 08/26/2008   KNEE, ARTHRITIS, DEGEN./OSTEO 07/25/2008   KNEE PAIN 07/25/2008   Teena Irani, PTA/CLT, WTA [phone]  Teena Irani, PTA 06/01/2021, 1:19 PM  [facility] [address] [place], [place], [address] Phone: [phone]   Fax:   [phone]  Name: Kathy Dawson MRN: [id number] Date of Birth: 07/14/1956

## 2021-06-02 ENCOUNTER — Encounter (HOSPITAL_COMMUNITY): Payer: 59 | Admitting: Physical Therapy

## 2021-06-03 ENCOUNTER — Ambulatory Visit (HOSPITAL_COMMUNITY): Payer: 59 | Admitting: Physical Therapy

## 2021-06-03 ENCOUNTER — Other Ambulatory Visit: Payer: Self-pay

## 2021-06-03 DIAGNOSIS — I890 Lymphedema, not elsewhere classified: Secondary | ICD-10-CM

## 2021-06-03 NOTE — Therapy (Signed)
[place], [place], [address] Phone: [phone]   Fax:  [phone]  Physical Therapy Treatment  Patient Details  Name: Kathy Dawson MRN: [id number] Date of Birth: Jun 04, 1956 Referring Provider (PT): Delphina Cahill   Encounter Date: 06/03/2021   PT End of Session - 06/03/21 1231     Visit Number 4    Number of Visits 18    Date for PT Re-Evaluation 07/08/21    Authorization Type [employer] 30 visit limit 7 has been used    Authorization - Visit Number 10    Authorization - Number of Visits 30    Progress Note Due on Visit 10    PT Start Time 1010    PT Stop Time 1120    PT Time Calculation (min) 70 min    Activity Tolerance Patient tolerated treatment well    Behavior During Therapy [facility] for tasks assessed/performed             Past Medical History:  Diagnosis Date   Anemia    Hx   Anemia, iron deficiency 06/09/2013   Arthritis    knees, hips, elbow   Complication of anesthesia 14 yrs. ago   woke up during the appendectomy   GERD (gastroesophageal reflux disease)    otc med prn   Knee pain     Past Surgical History:  Procedure Laterality Date   ABDOMINAL HYSTERECTOMY     ANTERIOR HIP REVISION Right 11/18/2014   Procedure: Right Total Hip Arthroplasty Femoral Revision-Anteior approach;  Surgeon: Marybelle Killings, MD;  Location: [place];  Service: Orthopedics;  Laterality: Right;   APPENDECTOMY     bilateral foot surgery     hammer toes   BLADDER SUSPENSION N/A 02/02/2016   Procedure: TRANSVAGINAL TAPE (TVT) Mid-Urethral Sling PROCEDURE;  Surgeon: Azucena Fallen, MD;  Location: [facility];  Service: Gynecology;  Laterality: N/A;   COLONOSCOPY  12/08/2011   [id number] hemorrhoids/Sigmoid diverticulosis. Colonic polyp (tubular adenoma). Surveillance 2018   COLONOSCOPY N/A 09/09/2017   Procedure: COLONOSCOPY;  Surgeon: Daneil Dolin, MD;  [facility];  Service: Endoscopy;  Laterality: N/A;   2:15pm   CYSTOSCOPY N/A 02/02/2016   Procedure: CYSTOSCOPY;  Surgeon: Azucena Fallen, MD;  Location: [facility];  Service: Gynecology;  Laterality: N/A;   ESOPHAGOGASTRODUODENOSCOPY N/A 09/16/2014   Procedure: ESOPHAGOGASTRODUODENOSCOPY (EGD);  Surgeon: Daneil Dolin, MD;  [facility];  Service: Endoscopy;  Laterality: N/A;  1030am   KNEE SURGERY Right 2011   arthroscopy   MALONEY DILATION N/A 09/16/2014   Procedure: Venia Minks DILATION;  Surgeon: Daneil Dolin, MD;  [facility];  Service: Endoscopy;  Laterality: N/A;   REPLACEMENT TOTAL KNEE Right 01/19/2013   TOTAL HIP ARTHROPLASTY Right 11/04/2014   Procedure: TOTAL HIP ARTHROPLASTY ANTERIOR APPROACH, Possible Local Bonegrafting Acetabular Cyst;  Surgeon: Marybelle Killings, MD;  Location: [facility];  Service: Orthopedics;  Laterality: Right;   TUBAL LIGATION     VAGINAL HYSTERECTOMY N/A 02/02/2016   Procedure: HYSTERECTOMY VAGINAL;  Surgeon: Azucena Fallen, MD;  Location: [facility];  Service: Gynecology;  Laterality: N/A;    There were no vitals filed for this visit.   Subjective Assessment - 06/03/21 1229     Subjective PT states the first time she was bandaged she could tell that she had lost volume but she feels that she gained the  volume she lost after the last bandaging  LYMPHEDEMA/ONCOLOGY QUESTIONNAIRE - 06/03/21 0001       Left Upper Extremity Lymphedema   At Axilla  36.5 cm    15 cm Proximal to Olecranon Process 37.5 cm    10 cm Proximal to Olecranon Process 37.5 cm    Olecranon Process 35 cm    15 cm Proximal to Ulnar Styloid Process 34.3 cm    10 cm Proximal to Ulnar Styloid Process 31.5 cm    Just Proximal to Ulnar Styloid Process 24.3 cm    Across Hand at [employer] 21.1 cm    At [place] of 2nd Digit 7 cm    At [facility] of Thumb 7.3 cm                        [facility] Adult PT Treatment/Exercise - 06/03/21 0001       Manual Therapy   Manual Therapy Manual Lymphatic Drainage  (MLD);Compression Bandaging    Manual therapy comments Manual complete separate than rest of tx    Manual Lymphatic Drainage (MLD) Neck, superficial then deep abdominal, all limbs routing to central abdomen only    Compression Bandaging Multilayer short stretch to Lt UE with1//2 foam,                       PT Short Term Goals - 06/03/21 1238       PT SHORT TERM GOAL #1   Title PT to be completing UE exercises to improve lymphatic circulation    Time 3    Period Weeks    Status Achieved    Target Date 06/17/21      PT SHORT TERM GOAL #2   Title PT to have obtained compression garment for LE    Time 3    Period Weeks    Status On-going               PT Long Term Goals - 06/03/21 1238       PT LONG TERM GOAL #1   Title PT measurment of LT UE to be within  1 cm of RT to be ready for UE compression garments    Time 6    Period Weeks    Status On-going      PT LONG TERM GOAL #2   Title PT to be I in self manual techniqes.    Time 6    Period Weeks    Status On-going                   Plan - 06/03/21 1232     Clinical Impression Statement Pt measured with decrease measurements in all areas, although minimal reduction.  PT continues to have significant induration in LT UE.  Rt UE measured for compression garment. Sent prescription for    Personal Factors and Comorbidities Comorbidity 1;Comorbidity 2    Comorbidities OA, all 4 limbs involved    Examination-Activity Limitations Bathing;Carry;Hygiene/Grooming;Other;Dressing    Examination-Participation Restrictions Cleaning;Occupation;Shop;Yard Work    [employer] officer Evolving/Moderate complexity    Rehab Potential Good    PT Frequency 3x / week    PT Duration 6 weeks    PT Treatment/Interventions Patient/family education;Manual lymph drainage;Compression bandaging    PT Next Visit Plan On days with 45 min complete short neck, deep and superfical abdominal and B UE manual lymph  drainage to not shunt to opposite arm or same side LE, Complete compression bandaging to LT UE only.  90 min complete ant/post LE B as well no shunting to UE or opposite leg.  Provide information on capris she can order from [employer].  Measure on Wednesdays.    PT Home Exercise Plan shoulder, elbow, wrist and hand as well as diaphramic breathing and lymphatic squeeze.    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   History of total hip replacement 11/04/2014   GERD (gastroesophageal reflux disease) 08/29/2014   Tubular adenoma of colon 08/29/2014   Dysphagia, pharyngoesophageal phase 08/29/2014   Anemia, iron deficiency 06/09/2013   Difficulty in walking(719.7) 02/05/2013   Stiffness of right knee 02/05/2013   Bone infection, knee ([place]) 12/19/2012   Microcytic anemia 10/09/2012   Pain due to total knee replacement (HCC) 01/31/2012   Rectal bleeding 11/27/2011   Constipation 11/27/2011   Edema of leg 11/19/2011   Osteoarthritis of right knee 11/19/2011   DERANGEMENT MENISCUS 08/26/2008   KNEE, ARTHRITIS, DEGEN./OSTEO 07/25/2008   KNEE PAIN 07/25/2008    Rayetta Humphrey, PT CLT [phone]  06/03/2021, 12:39 PM  [facility] [place] [address] [place], [place], [address] Phone: [phone]   Fax:  [phone]  Name: Kathy Dawson MRN: [id number] Date of Birth: [birth year]

## 2021-06-04 ENCOUNTER — Ambulatory Visit (HOSPITAL_COMMUNITY): Payer: 59 | Admitting: Physical Therapy

## 2021-06-05 ENCOUNTER — Encounter (HOSPITAL_COMMUNITY): Payer: 59

## 2021-06-08 ENCOUNTER — Ambulatory Visit (HOSPITAL_COMMUNITY): Payer: 59 | Admitting: Physical Therapy

## 2021-06-08 ENCOUNTER — Other Ambulatory Visit: Payer: Self-pay

## 2021-06-08 DIAGNOSIS — I890 Lymphedema, not elsewhere classified: Secondary | ICD-10-CM

## 2021-06-08 DIAGNOSIS — R29898 Other symptoms and signs involving the musculoskeletal system: Secondary | ICD-10-CM
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
## 2021-06-09 ENCOUNTER — Encounter (HOSPITAL_COMMUNITY): Payer: 59 | Admitting: Physical Therapy

## 2021-06-10 ENCOUNTER — Ambulatory Visit (HOSPITAL_COMMUNITY): Payer: 59 | Admitting: Physical Therapy

## 2021-06-10 ENCOUNTER — Telehealth (HOSPITAL_COMMUNITY): Payer: Self-pay | Admitting: Physical Therapy

## 2021-06-10 ENCOUNTER — Other Ambulatory Visit: Payer: Self-pay

## 2021-06-10 DIAGNOSIS — I890 Lymphedema, not elsewhere classified: Secondary | ICD-10-CM | POA: Diagnosis not present

## 2021-06-10 DIAGNOSIS — M25512 Pain in left shoulder: Secondary | ICD-10-CM

## 2021-06-10 DIAGNOSIS — R29898 Other symptoms and signs involving the musculoskeletal system: Secondary | ICD-10-CM

## 2021-06-10 DIAGNOSIS — M25612 Stiffness of left shoulder, not elsewhere classified: Secondary | ICD-10-CM

## 2021-06-10 NOTE — Telephone Encounter (Signed)
Order faxed to MD for second time for signature for upper extremity compression sleeves  Teena Irani, PTA/CLT, [place] [phone]

## 2021-06-10 NOTE — Therapy (Signed)
[place], [place], [address] Phone: [phone]   Fax:  [phone]  Physical Therapy Treatment  Patient Details  Name: Kathy Dawson MRN: [id number] Date of Birth: 09-Aug-1956 Referring Provider (PT): Delphina Cahill   Encounter Date: 06/10/2021   PT End of Session - 06/10/21 1429     Visit Number 6    Number of Visits 18    Date for PT Re-Evaluation 07/08/21    Authorization Type [employer] 30 visit limit 7 has been used    Authorization - Visit Number 12    Authorization - Number of Visits 30    Progress Note Due on Visit 10    PT Start Time 1320    PT Stop Time 1418    PT Time Calculation (min) 58 min    Activity Tolerance Patient tolerated treatment well    Behavior During Therapy [facility] for tasks assessed/performed             Past Medical History:  Diagnosis Date   Anemia    Hx   Anemia, iron deficiency 06/09/2013   Arthritis    knees, hips, elbow   Complication of anesthesia 14 yrs. ago   woke up during the appendectomy   GERD (gastroesophageal reflux disease)    otc med prn   Knee pain     Past Surgical History:  Procedure Laterality Date   ABDOMINAL HYSTERECTOMY     ANTERIOR HIP REVISION Right 11/18/2014   Procedure: Right Total Hip Arthroplasty Femoral Revision-Anteior approach;  Surgeon: Marybelle Killings, MD;  Location: [place];  Service: Orthopedics;  Laterality: Right;   APPENDECTOMY     bilateral foot surgery     hammer toes   BLADDER SUSPENSION N/A 02/02/2016   Procedure: TRANSVAGINAL TAPE (TVT) Mid-Urethral Sling PROCEDURE;  Surgeon: Azucena Fallen, MD;  Location: [facility];  Service: Gynecology;  Laterality: N/A;   COLONOSCOPY  12/08/2011   [id number] hemorrhoids/Sigmoid diverticulosis. Colonic polyp (tubular adenoma). Surveillance 2018   COLONOSCOPY N/A 09/09/2017   Procedure: COLONOSCOPY;  Surgeon: Daneil Dolin, MD;  [facility];  Service: Endoscopy;  Laterality: N/A;   2:15pm   CYSTOSCOPY N/A 02/02/2016   Procedure: CYSTOSCOPY;  Surgeon: Azucena Fallen, MD;  Location: [facility];  Service: Gynecology;  Laterality: N/A;   ESOPHAGOGASTRODUODENOSCOPY N/A 09/16/2014   Procedure: ESOPHAGOGASTRODUODENOSCOPY (EGD);  Surgeon: Daneil Dolin, MD;  [facility];  Service: Endoscopy;  Laterality: N/A;  1030am   KNEE SURGERY Right 2011   arthroscopy   MALONEY DILATION N/A 09/16/2014   Procedure: Venia Minks DILATION;  Surgeon: Daneil Dolin, MD;  [facility];  Service: Endoscopy;  Laterality: N/A;   REPLACEMENT TOTAL KNEE Right 01/19/2013   TOTAL HIP ARTHROPLASTY Right 11/04/2014   Procedure: TOTAL HIP ARTHROPLASTY ANTERIOR APPROACH, Possible Local Bonegrafting Acetabular Cyst;  Surgeon: Marybelle Killings, MD;  Location: [facility];  Service: Orthopedics;  Laterality: Right;   TUBAL LIGATION     VAGINAL HYSTERECTOMY N/A 02/02/2016   Procedure: HYSTERECTOMY VAGINAL;  Surgeon: Azucena Fallen, MD;  Location: [facility];  Service: Gynecology;  Laterality: N/A;    There were no vitals filed for this visit.   Subjective Assessment - 06/10/21 1425     Subjective Pt states she got the prednisone shot in her Lt shoulder and her arm already looks better since doing that.  No pain or issues today.    Pertinent History Kathy Dawson is a 65 y.o. (October 22, 1955)  female who presents for evaluation of upper and lower extremity swelling. She explains that this began in 2014 after she had right knee surgery. Her initial procedure was followed by several years of complications requiring multiple other surgeries. She subsequently also had right hip replacement that she also had complications after along with several other abdominal surgeries. Ever since these procedures she started developing worsening swelling of both of her legs and then she began having right arm swelling. Now she is having worsening left arm swelling. She says she has been evaluated by multiple specialists regarding this from  ortho to cardiology with no explanation. She has tried ice, elevation, compression, and various medications with no improvement. She additionally was receiving steroid injections which helped some but she knew she could not continue this indefinitely. Aside from the swelling her arms and legs are very tight, tender to touch, heavy, aching/ throbbing, and restless. She has some varicosities but these do not bother her. She has on several occasions gone to ER to be evaluated for DVTs but all studies have been negative.  She says she stays very active gardening, painting, working. She explains her frustration in wanting to be able to just "feel normal". She has no history of DVT. Some family history of DVT in her mother.    Currently in Pain? No/denies                   LYMPHEDEMA/ONCOLOGY QUESTIONNAIRE - 06/10/21 1435       What other symptoms do you have   Are you Having Heaviness or Tightness Yes    Are you having Pain Yes    Are you having pitting edema Yes    Body Site UE    Is it Hard or Difficult finding clothes that fit Yes    Do you have infections --   no     Lymphedema Stage   Stage STAGE 2 SPONTANEOUSLY IRREVERSIBLE      Lymphedema Assessments   Lymphedema Assessments Upper extremities      Right Upper Extremity Lymphedema   At Axilla  39.3 cm    15 cm Proximal to Olecranon Process 38 cm    10 cm Proximal to Olecranon Process 37 cm    Olecranon Process 35 cm    15 cm Proximal to Ulnar Styloid Process 32.2 cm    10 cm Proximal to Ulnar Styloid Process 27.8 cm    Just Proximal to Ulnar Styloid Process 21.3 cm    Across Hand at [employer] 21 cm    At [place] of 2nd Digit 6.7 cm    At [facility] of Thumb 7 cm      Left Upper Extremity Lymphedema   At Axilla  36 cm   was 37.5 on 10/12 evaluation   15 cm Proximal to Olecranon Process 35 cm   was 38   10 cm Proximal to Olecranon Process 35 cm   was 38.2   Olecranon Process 33.5 cm   was 36.8   15 cm Proximal to Ulnar  Styloid Process 34 cm   was 35.7   10 cm Proximal to Ulnar Styloid Process 31 cm   was 31.8   Just Proximal to Ulnar Styloid Process 22 cm   was 24.3   Across Hand at [employer] 21 cm   was 22   At [place] of 2nd Digit 6.8 cm   was 7.5   At [facility] of Thumb 7.3 cm   was 7.3  [facility] Adult PT Treatment/Exercise - 06/10/21 0001       Manual Therapy   Manual Therapy Manual Lymphatic Drainage (MLD);Compression Bandaging    Manual therapy comments Manual complete separate than rest of tx    Manual Lymphatic Drainage (MLD) short neck deep and superfical abdominal, B UE and B LE    Compression Bandaging Multilayer short stretch to Lt UE with1//2 foam,                       PT Short Term Goals - 06/03/21 1238       PT SHORT TERM GOAL #1   Title PT to be completing UE exercises to improve lymphatic circulation    Time 3    Period Weeks    Status Achieved    Target Date 06/17/21      PT SHORT TERM GOAL #2   Title PT to have obtained compression garment for LE    Time 3    Period Weeks    Status On-going               PT Long Term Goals - 06/03/21 1238       PT LONG TERM GOAL #1   Title PT measurment of LT UE to be within  1 cm of RT to be ready for UE compression garments    Time 6    Period Weeks    Status On-going      PT LONG TERM GOAL #2   Title PT to be I in self manual techniqes.    Time 6    Period Weeks    Status On-going                   Plan - 06/10/21 1431     Clinical Impression Statement Pt has all measurements and information needed to order compression for LE's but has not ordered these yet.  Pt with reduction this session per measurements.  Will measure next week and send for UE compression garment if pt fails to reduce any further.  Pt will likely go to [place] (order for garment re-faxed for second time for signature). Continued MLD for body routing to core; compression for Lt UE only.     Personal Factors and Comorbidities Comorbidity 1;Comorbidity 2    Comorbidities OA, all 4 limbs involved    Examination-Activity Limitations Bathing;Carry;Hygiene/Grooming;Other;Dressing    Examination-Participation Restrictions Cleaning;Occupation;Shop;Yard Work    [employer] officer Evolving/Moderate complexity    Rehab Potential Good    PT Frequency 3x / week    PT Duration 6 weeks    PT Treatment/Interventions Patient/family education;Manual lymph drainage;Compression bandaging    PT Next Visit Plan On days with 45 min complete short neck, deep and superfical abdominal and B UE manual lymph drainage to not shunt to opposite arm or same side LE, Complete compression bandaging to LT UE only.  90 min complete ant/post LE B as well no shunting to UE or opposite leg.   Measure on Wednesdays.    PT Home Exercise Plan shoulder, elbow, wrist and hand as well as diaphramic breathing and lymphatic squeeze.    Consulted and Agree with Plan of Care Patient             Patient will benefit from skilled therapeutic intervention in order to improve the following deficits and impairments:  Decreased activity tolerance, Pain, Impaired UE functional use, Increased edema, Decreased skin integrity  Visit Diagnosis: Lymphedema, not elsewhere classified  Other symptoms and signs involving the musculoskeletal system  Chronic left shoulder pain  Stiffness of left shoulder, not elsewhere classified     Problem List Patient Active Problem List   Diagnosis Date Noted   Urinary, incontinence, stress female 02/02/2016   Aftercare following right knee joint replacement surgery 01/21/2015   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/18/2014   History of total hip replacement 11/04/2014   GERD (gastroesophageal reflux disease) 08/29/2014   Tubular adenoma of colon 08/29/2014   Dysphagia, pharyngoesophageal phase 08/29/2014   Anemia, iron deficiency 06/09/2013   Difficulty in walking(719.7)  02/05/2013   Stiffness of right knee 02/05/2013   Bone infection, knee ([place]) 12/19/2012   Microcytic anemia 10/09/2012   Pain due to total knee replacement ([place]) 01/31/2012   Rectal bleeding 11/27/2011   Constipation 11/27/2011   Edema of leg 11/19/2011   Osteoarthritis of right knee 11/19/2011   DERANGEMENT MENISCUS 08/26/2008   KNEE, ARTHRITIS, DEGEN./OSTEO 07/25/2008   KNEE PAIN 07/25/2008   Teena Irani, PTA/CLT, WTA [phone]  Teena Irani, PTA 06/10/2021, 5:44 PM  [place], [place], [address] Phone: [phone]   Fax:  [phone]  Name: Kathy Dawson MRN: [id number] Date of Birth: May 30, 1956

## 2021-06-12 ENCOUNTER — Ambulatory Visit (HOSPITAL_COMMUNITY): Payer: 59

## 2021-06-12 ENCOUNTER — Encounter (HOSPITAL_COMMUNITY): Payer: Self-pay

## 2021-06-12 ENCOUNTER — Other Ambulatory Visit: Payer: Self-pay

## 2021-06-12 DIAGNOSIS — I890 Lymphedema, not elsewhere classified: Secondary | ICD-10-CM | POA: Diagnosis not present

## 2021-06-12 NOTE — Therapy (Signed)
[place], [place], [address] Phone: [phone]   Fax:  [phone]  Physical Therapy Treatment  Patient Details  Name: [name] MRN: [id number] Date of Birth: 07-Jul-1956 Referring Provider (PT): Delphina Cahill   Encounter Date: 06/12/2021   PT End of Session - 06/12/21 1636     Visit Number 7    Number of Visits 18    Date for PT Re-Evaluation 07/08/21    Authorization Type [employer] 30 visit limit 7 has been used    Authorization - Visit Number 13    Authorization - Number of Visits 30    Progress Note Due on Visit 10    PT Start Time 3329    PT Stop Time 1630    PT Time Calculation (min) 57 min    Activity Tolerance Patient tolerated treatment well    Behavior During Therapy [facility] for tasks assessed/performed             Past Medical History:  Diagnosis Date   Anemia    Hx   Anemia, iron deficiency 06/09/2013   Arthritis    knees, hips, elbow   Complication of anesthesia 14 yrs. ago   woke up during the appendectomy   GERD (gastroesophageal reflux disease)    otc med prn   Knee pain     Past Surgical History:  Procedure Laterality Date   ABDOMINAL HYSTERECTOMY     ANTERIOR HIP REVISION Right 11/18/2014   Procedure: Right Total Hip Arthroplasty Femoral Revision-Anteior approach;  Surgeon: Marybelle Killings, MD;  Location: [facility];  Service: Orthopedics;  Laterality: Right;   APPENDECTOMY     bilateral foot surgery     hammer toes   BLADDER SUSPENSION N/A 02/02/2016   Procedure: TRANSVAGINAL TAPE (TVT) Mid-Urethral Sling PROCEDURE;  Surgeon: Azucena Fallen, MD;  Location: [facility];  Service: Gynecology;  Laterality: N/A;   COLONOSCOPY  12/08/2011   [id number] hemorrhoids/Sigmoid diverticulosis. Colonic polyp (tubular adenoma). Surveillance 2018   COLONOSCOPY N/A 09/09/2017   Procedure: COLONOSCOPY;  Surgeon: Daneil Dolin, MD;  [facility];  Service: Endoscopy;  Laterality: N/A;   2:15pm   CYSTOSCOPY N/A 02/02/2016   Procedure: CYSTOSCOPY;  Surgeon: Azucena Fallen, MD;  Location: [facility];  Service: Gynecology;  Laterality: N/A;   ESOPHAGOGASTRODUODENOSCOPY N/A 09/16/2014   Procedure: ESOPHAGOGASTRODUODENOSCOPY (EGD);  Surgeon: Daneil Dolin, MD;  [facility];  Service: Endoscopy;  Laterality: N/A;  1030am   KNEE SURGERY Right 2011   arthroscopy   MALONEY DILATION N/A 09/16/2014   Procedure: Venia Minks DILATION;  Surgeon: Daneil Dolin, MD;  [facility];  Service: Endoscopy;  Laterality: N/A;   REPLACEMENT TOTAL KNEE Right 01/19/2013   TOTAL HIP ARTHROPLASTY Right 11/04/2014   Procedure: TOTAL HIP ARTHROPLASTY ANTERIOR APPROACH, Possible Local Bonegrafting Acetabular Cyst;  Surgeon: Marybelle Killings, MD;  Location: [facility];  Service: Orthopedics;  Laterality: Right;   TUBAL LIGATION     VAGINAL HYSTERECTOMY N/A 02/02/2016   Procedure: HYSTERECTOMY VAGINAL;  Surgeon: Azucena Fallen, MD;  Location: [facility];  Service: Gynecology;  Laterality: N/A;    Vitals:     Subjective Assessment - 06/12/21 1539     Subjective Pt stated she had 3 headaches felt like a pencil going through veins last night.  Stated she lost 8 lbs, was measured at MD's office earlier today.    Pertinent History Kathy Dawson is a 65 y.o. (04-09-1956) female who presents  for evaluation of upper and lower extremity swelling. She explains that this began in 2014 after she had right knee surgery. Her initial procedure was followed by several years of complications requiring multiple other surgeries. She subsequently also had right hip replacement that she also had complications after along with several other abdominal surgeries. Ever since these procedures she started developing worsening swelling of both of her legs and then she began having right arm swelling. Now she is having worsening left arm swelling. She says she has been evaluated by multiple specialists regarding this from ortho to cardiology  with no explanation. She has tried ice, elevation, compression, and various medications with no improvement. She additionally was receiving steroid injections which helped some but she knew she could not continue this indefinitely. Aside from the swelling her arms and legs are very tight, tender to touch, heavy, aching/ throbbing, and restless. She has some varicosities but these do not bother her. She has on several occasions gone to ER to be evaluated for DVTs but all studies have been negative.  She says she stays very active gardening, painting, working. She explains her frustration in wanting to be able to just "feel normal". She has no history of DVT. Some family history of DVT in her mother.    Currently in Pain? Yes    Pain Score 7     Pain Location Head    Pain Descriptors / Indicators Aching                               [facility] Adult PT Treatment/Exercise - 06/12/21 0001       Manual Therapy   Manual Therapy Manual Lymphatic Drainage (MLD);Compression Bandaging    Manual therapy comments Manual complete separate than rest of tx    Manual Lymphatic Drainage (MLD) short neck deep and superfical abdominal, B UE and B LE    Compression Bandaging Multilayer short stretch to Lt UE with1//2 foam,                       PT Short Term Goals - 06/12/21 1238       PT SHORT TERM GOAL #1   Title PT to be completing UE exercises to improve lymphatic circulation    Time 3    Period Weeks    Status Achieved    Target Date 06/17/21      PT SHORT TERM GOAL #2   Title PT to have obtained compression garment for LE    Time 3    Period Weeks    Status On-going               PT Long Term Goals - 06/12/21 1238       PT LONG TERM GOAL #1   Title PT measurment of LT UE to be within  1 cm of RT to be ready for UE compression garments    Time 6    Period Weeks    Status On-going      PT LONG TERM GOAL #2   Title PT to be I in self manual techniqes.     Time 6    Period Weeks    Status On-going                   Plan - 06/12/21 1639     Clinical Impression Statement Manual decongestive techniques complete for all extremity to core.  Multilayer short stretch bandages to Lt UE only.  Have not received signed order yet for custom sleeves.  Reports of comfort at [facility].    Personal Factors and Comorbidities Comorbidity 1;Comorbidity 2    Comorbidities OA, all 4 limbs involved    Examination-Activity Limitations Bathing;Carry;Hygiene/Grooming;Other;Dressing    Examination-Participation Restrictions Cleaning;Occupation;Shop;Yard Work    [employer] officer Evolving/Moderate complexity    Clinical Decision Making Moderate    Rehab Potential Good    PT Frequency 3x / week    PT Duration 6 weeks    PT Treatment/Interventions Patient/family education;Manual lymph drainage;Compression bandaging    PT Next Visit Plan On days with 45 min complete short neck, deep and superfical abdominal and B UE manual lymph drainage to not shunt to opposite arm or same side LE, Complete compression bandaging to LT UE only.  90 min complete ant/post LE B as well no shunting to UE or opposite leg.   Measure on Wednesdays.    PT Home Exercise Plan shoulder, elbow, wrist and hand as well as diaphramic breathing and lymphatic squeeze.    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   History of total hip replacement  11/04/2014   GERD (gastroesophageal reflux disease) 08/29/2014   Tubular adenoma of colon 08/29/2014   Dysphagia, pharyngoesophageal phase 08/29/2014   Anemia, iron deficiency 06/09/2013   Difficulty in walking(719.7) 02/05/2013   Stiffness of right knee 02/05/2013   Bone infection, knee ([place]) 12/19/2012   Microcytic anemia 10/09/2012   Pain due to total knee replacement ([facility]) 01/31/2012   Rectal bleeding 11/27/2011   Constipation 11/27/2011   Edema of leg 11/19/2011   Osteoarthritis of right knee 11/19/2011   DERANGEMENT MENISCUS 08/26/2008   KNEE, ARTHRITIS, DEGEN./OSTEO 07/25/2008   KNEE PAIN 07/25/2008    Ihor Austin, LPTA/CLT; CBIS [phone]  Aldona Lento, PTA 06/12/2021, 4:44 PM  [place] [address] [place], [place], [address] Phone: [phone]   Fax:  [phone]  Name: Kathy Dawson MRN: [id number] Date of Birth: 10-22-1955

## 2021-06-15 ENCOUNTER — Encounter (HOSPITAL_COMMUNITY): Payer: 59 | Admitting: Physical Therapy

## 2021-06-16 ENCOUNTER — Ambulatory Visit (HOSPITAL_COMMUNITY): Payer: 59 | Attending: Internal Medicine | Admitting: Physical Therapy

## 2021-06-16 ENCOUNTER — Other Ambulatory Visit: Payer: Self-pay

## 2021-06-16 DIAGNOSIS — I890 Lymphedema, not elsewhere classified: Secondary | ICD-10-CM | POA: Insufficient documentation

## 2021-06-16 DIAGNOSIS — M25612 Stiffness of left shoulder, not elsewhere classified: Secondary | ICD-10-CM | POA: Diagnosis present

## 2021-06-16 DIAGNOSIS — M25512 Pain in left shoulder: Secondary | ICD-10-CM | POA: Diagnosis present

## 2021-06-16 DIAGNOSIS — G8929 Other chronic pain: Secondary | ICD-10-CM | POA: Diagnosis present

## 2021-06-16 DIAGNOSIS — R29898 Other symptoms and signs involving the musculoskeletal system: Secondary | ICD-10-CM | POA: Insufficient documentation

## 2021-06-16 NOTE — Therapy (Signed)
[facility] [place], [place], [address] Phone: [phone]   Fax:  [phone]  Physical Therapy Treatment  Patient Details  Name: Kathy Dawson MRN: [id number] Date of Birth: 04-Jun-1956 Referring Provider (PT): Delphina Cahill   Encounter Date: 06/16/2021   PT End of Session - 06/16/21 1311     Visit Number 8    Number of Visits 18    Date for PT Re-Evaluation 07/08/21    Authorization Type [employer] 30 visit limit 7 has been used    Authorization - Visit Number 14    Authorization - Number of Visits 30    Progress Note Due on Visit 10    PT Start Time 1045    PT Stop Time 1150    PT Time Calculation (min) 65 min    Activity Tolerance Patient tolerated treatment well    Behavior During Therapy [facility] for tasks assessed/performed             Past Medical History:  Diagnosis Date   Anemia    Hx   Anemia, iron deficiency 06/09/2013   Arthritis    knees, hips, elbow   Complication of anesthesia 14 yrs. ago   woke up during the appendectomy   GERD (gastroesophageal reflux disease)    otc med prn   Knee pain     Past Surgical History:  Procedure Laterality Date   ABDOMINAL HYSTERECTOMY     ANTERIOR HIP REVISION Right 11/18/2014   Procedure: Right Total Hip Arthroplasty Femoral Revision-Anteior approach;  Surgeon: Marybelle Killings, MD;  Location: [place];  Service: Orthopedics;  Laterality: Right;   APPENDECTOMY     bilateral foot surgery     hammer toes   BLADDER SUSPENSION N/A 02/02/2016   Procedure: TRANSVAGINAL TAPE (TVT) Mid-Urethral Sling PROCEDURE;  Surgeon: Azucena Fallen, MD;  Location: [facility];  Service: Gynecology;  Laterality: N/A;   COLONOSCOPY  12/08/2011   PTW:[id number] hemorrhoids/Sigmoid diverticulosis. Colonic polyp (tubular adenoma). Surveillance 2018   COLONOSCOPY N/A 09/09/2017   Procedure: COLONOSCOPY;  Surgeon: Daneil Dolin, MD;  [facility];  Service: Endoscopy;  Laterality: N/A;   2:15pm   CYSTOSCOPY N/A 02/02/2016   Procedure: CYSTOSCOPY;  Surgeon: Azucena Fallen, MD;  Location: [facility];  Service: Gynecology;  Laterality: N/A;   ESOPHAGOGASTRODUODENOSCOPY N/A 09/16/2014   Procedure: ESOPHAGOGASTRODUODENOSCOPY (EGD);  Surgeon: Daneil Dolin, MD;  [facility];  Service: Endoscopy;  Laterality: N/A;  1030am   KNEE SURGERY Right 2011   arthroscopy   MALONEY DILATION N/A 09/16/2014   Procedure: Venia Minks DILATION;  Surgeon: Daneil Dolin, MD;  [facility];  Service: Endoscopy;  Laterality: N/A;   REPLACEMENT TOTAL KNEE Right 01/19/2013   TOTAL HIP ARTHROPLASTY Right 11/04/2014   Procedure: TOTAL HIP ARTHROPLASTY ANTERIOR APPROACH, Possible Local Bonegrafting Acetabular Cyst;  Surgeon: Marybelle Killings, MD;  Location: [place];  Service: Orthopedics;  Laterality: Right;   TUBAL LIGATION     VAGINAL HYSTERECTOMY N/A 02/02/2016   Procedure: HYSTERECTOMY VAGINAL;  Surgeon: Azucena Fallen, MD;  Location: [facility];  Service: Gynecology;  Laterality: N/A;    There were no vitals filed for this visit.   Subjective Assessment - 06/16/21 1153     Subjective Pt states that she the bandages were so loose when she left Friday they just about fell off, she had to keep pulling them up.  PT has not ordered her LE garments yet, has not called re UE  garments to elastic therapy and has not recieved a pump.    Pertinent History Kathy Dawson is a 65 y.o. (1955-09-09) female who presents for evaluation of upper and lower extremity swelling. She explains that this began in 2014 after she had right knee surgery. Her initial procedure was followed by several years of complications requiring multiple other surgeries. She subsequently also had right hip replacement that she also had complications after along with several other abdominal surgeries. Ever since these procedures she started developing worsening swelling of both of her legs and then she began having right arm swelling. Now she is  having worsening left arm swelling. She says she has been evaluated by multiple specialists regarding this from ortho to cardiology with no explanation. She has tried ice, elevation, compression, and various medications with no improvement. She additionally was receiving steroid injections which helped some but she knew she could not continue this indefinitely. Aside from the swelling her arms and legs are very tight, tender to touch, heavy, aching/ throbbing, and restless. She has some varicosities but these do not bother her. She has on several occasions gone to ER to be evaluated for DVTs but all studies have been negative.  She says she stays very active gardening, painting, working. She explains her frustration in wanting to be able to just "feel normal". She has no history of DVT. Some family history of DVT in her mother.    Currently in Pain? Yes    Pain Score 5    knee, we are not working on this area.                              [facility] Adult PT Treatment/Exercise - 06/16/21 0001       Manual Therapy   Manual Therapy Manual Lymphatic Drainage (MLD);Compression Bandaging    Manual therapy comments Manual complete separate than rest of tx    Manual Lymphatic Drainage (MLD) short neck deep and superfical abdominal, B UE and B LE    Compression Bandaging Multilayer short stretch to Lt UE with1//2 foam,                       PT Short Term Goals - 06/03/21 1238       PT SHORT TERM GOAL #1   Title PT to be completing UE exercises to improve lymphatic circulation    Time 3    Period Weeks    Status Achieved    Target Date 06/17/21      PT SHORT TERM GOAL #2   Title PT to have obtained compression garment for LE    Time 3    Period Weeks    Status On-going               PT Long Term Goals - 06/03/21 1238       PT LONG TERM GOAL #1   Title PT measurment of LT UE to be within  1 cm of RT to be ready for UE compression garments    Time 6     Period Weeks    Status On-going      PT LONG TERM GOAL #2   Title PT to be I in self manual techniqes.    Time 6    Period Weeks    Status On-going  Plan - 06/16/21 1312     Clinical Impression Statement Pt with less edema.  Therapist filled out order for pump and sent to [facility] care.    Comorbidities OA, all 4 limbs involved    Examination-Activity Limitations Bathing;Carry;Hygiene/Grooming;Other;Dressing    Examination-Participation Restrictions Cleaning;Occupation;Shop;Yard Work    [employer]chant [employer] officer Evolving/Moderate complexity    Clinical Decision Making Moderate    Rehab Potential Good    PT Frequency 3x / week    PT Duration 6 weeks    PT Treatment/Interventions Patient/family education;Manual lymph drainage;Compression bandaging    PT Next Visit Plan Measure for UE sleeve, have pt call elastic therapy to see if they have an off the shelf garment that would fit her.  On days with 45 min complete short neck, deep and superfical abdominal and B UE manual lymph drainage to not shunt to opposite arm or same side LE, Complete compression bandaging to LT UE only.  90 min complete ant/post LE B as well no shunting to UE or opposite leg.   Measure on Wednesdays.             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   History of total hip replacement 11/04/2014   GERD (gastroesophageal reflux disease) 08/29/2014   Tubular adenoma of colon 08/29/2014   Dysphagia,  pharyngoesophageal phase 08/29/2014   Anemia, iron deficiency 06/09/2013   Difficulty in walking(719.7) 02/05/2013   Stiffness of right knee 02/05/2013   Bone infection, knee ([place]) 12/19/2012   Microcytic anemia 10/09/2012   Pain due to total knee replacement (HCC) 01/31/2012   Rectal bleeding 11/27/2011   Constipation 11/27/2011   Edema of leg 11/19/2011   Osteoarthritis of right knee 11/19/2011   DERANGEMENT MENISCUS 08/26/2008   KNEE, ARTHRITIS, DEGEN./OSTEO 07/25/2008   KNEE PAIN 07/25/2008   Rayetta Humphrey, PT CLT [phone]  06/16/2021, 1:14 PM  [facility] [address] [place], [place], [address] Phone: [phone]   Fax:  [phone]  Name: Kathy Dawson MRN: [id number] Date of Birth: 1955/10/12

## 2021-06-17 ENCOUNTER — Ambulatory Visit (HOSPITAL_COMMUNITY): Payer: 59 | Admitting: Physical Therapy

## 2021-06-17 ENCOUNTER — Telehealth (HOSPITAL_COMMUNITY): Payer: Self-pay | Admitting: Physical Therapy

## 2021-06-17 ENCOUNTER — Encounter (HOSPITAL_COMMUNITY): Payer: Self-pay

## 2021-06-17 NOTE — Telephone Encounter (Signed)
Pt cancelled appointment yesterday.  She is unable to make two appointments in a row.    Rayetta Humphrey, [place] CLT [phone]

## 2021-06-19 ENCOUNTER — Encounter (HOSPITAL_COMMUNITY): Payer: Self-pay | Admitting: Physical Therapy

## 2021-06-19 ENCOUNTER — Ambulatory Visit (HOSPITAL_COMMUNITY): Payer: 59 | Admitting: Physical Therapy

## 2021-06-19 ENCOUNTER — Other Ambulatory Visit: Payer: Self-pay

## 2021-06-19 DIAGNOSIS — I890 Lymphedema, not elsewhere classified: Secondary | ICD-10-CM | POA: Diagnosis not present

## 2021-06-19 NOTE — Therapy (Signed)
[place], [place], [address] Phone: [phone]   Fax:  [phone]  Physical Therapy Treatment  Patient Details  Name: PHUONG HILLARY MRN: [id number] Date of Birth: 15-Feb-1956 Referring Provider (PT): Delphina Cahill   Encounter Date: 06/19/2021   PT End of Session - 06/19/21 1224     Visit Number 9    Number of Visits 18    Date for PT Re-Evaluation 07/08/21    Authorization Type [employer] 30 visit limit 7 has been used    Authorization - Visit Number 15    Authorization - Number of Visits 30    Progress Note Due on Visit 10    PT Start Time 1000    PT Stop Time 1050    PT Time Calculation (min) 50 min    Activity Tolerance Patient tolerated treatment well    Behavior During Therapy [facility] for tasks assessed/performed             Past Medical History:  Diagnosis Date   Anemia    Hx   Anemia, iron deficiency 06/09/2013   Arthritis    knees, hips, elbow   Complication of anesthesia 14 yrs. ago   woke up during the appendectomy   GERD (gastroesophageal reflux disease)    otc med prn   Knee pain     Past Surgical History:  Procedure Laterality Date   ABDOMINAL HYSTERECTOMY     ANTERIOR HIP REVISION Right 11/18/2014   Procedure: Right Total Hip Arthroplasty Femoral Revision-Anteior approach;  Surgeon: Marybelle Killings, MD;  Location: [place];  Service: Orthopedics;  Laterality: Right;   APPENDECTOMY     bilateral foot surgery     hammer toes   BLADDER SUSPENSION N/A 02/02/2016   Procedure: TRANSVAGINAL TAPE (TVT) Mid-Urethral Sling PROCEDURE;  Surgeon: Azucena Fallen, MD;  Location: [facility];  Service: Gynecology;  Laterality: N/A;   COLONOSCOPY  12/08/2011   [id number] hemorrhoids/Sigmoid diverticulosis. Colonic polyp (tubular adenoma). Surveillance 2018   COLONOSCOPY N/A 09/09/2017   Procedure: COLONOSCOPY;  Surgeon: Daneil Dolin, MD;  [facility];  Service: Endoscopy;  Laterality: N/A;   2:15pm   CYSTOSCOPY N/A 02/02/2016   Procedure: CYSTOSCOPY;  Surgeon: Azucena Fallen, MD;  Location: [facility];  Service: Gynecology;  Laterality: N/A;   ESOPHAGOGASTRODUODENOSCOPY N/A 09/16/2014   Procedure: ESOPHAGOGASTRODUODENOSCOPY (EGD);  Surgeon: Daneil Dolin, MD;  [facility];  Service: Endoscopy;  Laterality: N/A;  1030am   KNEE SURGERY Right 2011   arthroscopy   MALONEY DILATION N/A 09/16/2014   Procedure: Venia Minks DILATION;  Surgeon: Daneil Dolin, MD;  [facility];  Service: Endoscopy;  Laterality: N/A;   REPLACEMENT TOTAL KNEE Right 01/19/2013   TOTAL HIP ARTHROPLASTY Right 11/04/2014   Procedure: TOTAL HIP ARTHROPLASTY ANTERIOR APPROACH, Possible Local Bonegrafting Acetabular Cyst;  Surgeon: Marybelle Killings, MD;  Location: [place];  Service: Orthopedics;  Laterality: Right;   TUBAL LIGATION     VAGINAL HYSTERECTOMY N/A 02/02/2016   Procedure: HYSTERECTOMY VAGINAL;  Surgeon: Azucena Fallen, MD;  Location: [facility];  Service: Gynecology;  Laterality: N/A;    There were no vitals filed for this visit.   Subjective Assessment - 06/19/21 1000     Subjective PT states that she uses her hand to much and the bandaging starts coming off.  Pt states she has not ordered the compression for her legs or her arm sleeves at this time.  Pt states her MD  is sending her to a lymphedema specialist at [facility].    Pertinent History SANTA ABDELRAHMAN is a 65 y.o. (20-Apr-1956) female who presents for evaluation of upper and lower extremity swelling. She explains that this began in 2014 after she had right knee surgery. Her initial procedure was followed by several years of complications requiring multiple other surgeries. She subsequently also had right hip replacement that she also had complications after along with several other abdominal surgeries. Ever since these procedures she started developing worsening swelling of both of her legs and then she began having right arm swelling. Now she is having  worsening left arm swelling. She says she has been evaluated by multiple specialists regarding this from ortho to cardiology with no explanation. She has tried ice, elevation, compression, and various medications with no improvement. She additionally was receiving steroid injections which helped some but she knew she could not continue this indefinitely. Aside from the swelling her arms and legs are very tight, tender to touch, heavy, aching/ throbbing, and restless. She has some varicosities but these do not bother her. She has on several occasions gone to ER to be evaluated for DVTs but all studies have been negative.  She says she stays very active gardening, painting, working. She explains her frustration in wanting to be able to just "feel normal". She has no history of DVT. Some family history of DVT in her mother.    Currently in Pain? No/denies                   LYMPHEDEMA/ONCOLOGY QUESTIONNAIRE - 06/19/21 0001       Lymphedema Assessments   Lymphedema Assessments Upper extremities      Right Upper Extremity Lymphedema   At Axilla  37.5 cm    15 cm Proximal to Olecranon Process 36.8 cm    10 cm Proximal to Olecranon Process 36.8 cm    Olecranon Process 32.9 cm    15 cm Proximal to Ulnar Styloid Process 32 cm    10 cm Proximal to Ulnar Styloid Process 28.3 cm    Just Proximal to Ulnar Styloid Process 20.3 cm    Across Hand at [employer] 20.8 cm    At [place] of 2nd Digit 6.8 cm    At [facility] of Thumb 7.2 cm      Left Upper Extremity Lymphedema   At Axilla  37.2 cm    15 cm Proximal to Olecranon Process 36.3 cm    10 cm Proximal to Olecranon Process 34.5 cm    Olecranon Process 32.8 cm    15 cm Proximal to Ulnar Styloid Process 30 cm    10 cm Proximal to Ulnar Styloid Process 27 cm    Just Proximal to Ulnar Styloid Process 18.5 cm    Across Hand at [employer] 19.8 cm    At [place] of 2nd Digit 6.8 cm    At [facility] of Thumb 7 cm                         [facility] Adult PT Treatment/Exercise - 06/19/21 0001       Manual Therapy   Manual Therapy Manual Lymphatic Drainage (MLD);Compression Bandaging    Manual therapy comments Manual complete separate than rest of tx    Manual Lymphatic Drainage (MLD) short neck deep and superfical abdominal, B UE LE not completed due to time restraints.    Compression Bandaging Multilayer short stretch to Lt  UE with1//2 foam,                       PT Short Term Goals - 06/03/21 1238       PT SHORT TERM GOAL #1   Title PT to be completing UE exercises to improve lymphatic circulation    Time 3    Period Weeks    Status Achieved    Target Date 06/17/21      PT SHORT TERM GOAL #2   Title PT to have obtained compression garment for LE    Time 3    Period Weeks    Status On-going               PT Long Term Goals - 06/03/21 1238       PT LONG TERM GOAL #1   Title PT measurment of LT UE to be within  1 cm of RT to be ready for UE compression garments    Time 6    Period Weeks    Status On-going      PT LONG TERM GOAL #2   Title PT to be I in self manual techniqes.    Time 6    Period Weeks    Status On-going                   Plan - 06/19/21 1225     Clinical Impression Statement PT remeasured with noted reduction.  Therapist gave pt new measurements for LE and UE and instructed to order garments.  Pt vocalized understanding.    Personal Factors and Comorbidities Comorbidity 1;Comorbidity 2    Comorbidities OA, all 4 limbs involved    Examination-Activity Limitations Bathing;Carry;Hygiene/Grooming;Other;Dressing    Examination-Participation Restrictions Cleaning;Occupation;Shop;Yard Work    [employer]chant [employer] officer Evolving/Moderate complexity    Clinical Decision Making Moderate    Rehab Potential Good    PT Duration 6 weeks    PT Treatment/Interventions Patient/family education;Manual lymph drainage;Compression bandaging    PT Next Visit Plan On days  with 45 min complete short neck, deep and superfical abdominal and B UE manual lymph drainage to not shunt to opposite arm or same side LE, Complete compression bandaging to LT UE only.  90 min complete ant/post LE B as well no shunting to UE or opposite leg.   Measure on Wednesdays.             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   History of total hip replacement 11/04/2014   GERD (gastroesophageal reflux disease) 08/29/2014   Tubular adenoma of colon 08/29/2014   Dysphagia, pharyngoesophageal phase 08/29/2014   Anemia, iron deficiency 06/09/2013   Difficulty in walking(719.7) 02/05/2013   Stiffness of right knee 02/05/2013   Bone infection, knee ([place]) 12/19/2012   Microcytic anemia 10/09/2012   Pain due to total knee replacement (HCC) 01/31/2012   Rectal bleeding 11/27/2011   Constipation 11/27/2011   Edema of leg 11/19/2011   Osteoarthritis of right knee 11/19/2011   DERANGEMENT MENISCUS 08/26/2008   KNEE, ARTHRITIS, DEGEN./OSTEO 07/25/2008   KNEE  PAIN 07/25/2008    Rayetta Humphrey, [place] CLT [phone]  06/19/2021, 12:27 PM  [place] [address] [place], [place], [address] Phone: [phone]   Fax:  [phone]  Name: TAKAYLA BAILLIE MRN: [id number] Date of Birth: 1955/10/05

## 2021-06-22 ENCOUNTER — Encounter (HOSPITAL_COMMUNITY): Payer: 59 | Admitting: Physical Therapy

## 2021-06-24 ENCOUNTER — Ambulatory Visit (HOSPITAL_COMMUNITY): Payer: 59

## 2021-06-25 ENCOUNTER — Telehealth (HOSPITAL_COMMUNITY): Payer: Self-pay | Admitting: Physical Therapy

## 2021-06-25 NOTE — Telephone Encounter (Signed)
She is not feeling well today and wants to cx tomorrow 06/26/2021

## 2021-06-26 ENCOUNTER — Encounter (HOSPITAL_COMMUNITY): Payer: 59 | Admitting: Physical Therapy

## 2021-06-29 ENCOUNTER — Other Ambulatory Visit: Payer: Self-pay

## 2021-06-29 ENCOUNTER — Ambulatory Visit (HOSPITAL_COMMUNITY): Payer: 59 | Admitting: Physical Therapy

## 2021-06-29 DIAGNOSIS — M25612 Stiffness of left shoulder, not elsewhere classified: Secondary | ICD-10-CM

## 2021-06-29 DIAGNOSIS — I890 Lymphedema, not elsewhere classified: Secondary | ICD-10-CM

## 2021-06-29 DIAGNOSIS — G8929 Other chronic pain: Secondary | ICD-10-CM

## 2021-06-29 DIAGNOSIS — R29898 Other symptoms and signs involving the musculoskeletal system: Secondary | ICD-10-CM

## 2021-06-29 NOTE — Therapy (Signed)
[place], [place], [address] Phone: [phone]   Fax:  [phone]  Physical Therapy Treatment  Patient Details  Name: Kathy Dawson MRN: [id number] Date of Birth: August 16, 1956 Referring Provider (PT): Delphina Cahill   Encounter Date: 06/29/2021   PT End of Session - 06/29/21 1019     Visit Number 10    Number of Visits 18    Date for PT Re-Evaluation 07/08/21    Authorization Type [employer] 30 visit limit 7 has been used    Authorization - Visit Number 15    Authorization - Number of Visits 30    Progress Note Due on Visit 10    PT Start Time [phone]    PT Stop Time 0925    PT Time Calculation (min) 47 min    Activity Tolerance Patient tolerated treatment well    Behavior During Therapy [facility] for tasks assessed/performed             Past Medical History:  Diagnosis Date   Anemia    Hx   Anemia, iron deficiency 06/09/2013   Arthritis    knees, hips, elbow   Complication of anesthesia 14 yrs. ago   woke up during the appendectomy   GERD (gastroesophageal reflux disease)    otc med prn   Knee pain     Past Surgical History:  Procedure Laterality Date   ABDOMINAL HYSTERECTOMY     ANTERIOR HIP REVISION Right 11/18/2014   Procedure: Right Total Hip Arthroplasty Femoral Revision-Anteior approach;  Surgeon: Marybelle Killings, MD;  Location: [facility];  Service: Orthopedics;  Laterality: Right;   APPENDECTOMY     bilateral foot surgery     hammer toes   BLADDER SUSPENSION N/A 02/02/2016   Procedure: TRANSVAGINAL TAPE (TVT) Mid-Urethral Sling PROCEDURE;  Surgeon: Azucena Fallen, MD;  Location: [facility];  Service: Gynecology;  Laterality: N/A;   COLONOSCOPY  12/08/2011   LDJ:[id number] hemorrhoids/Sigmoid diverticulosis. Colonic polyp (tubular adenoma). Surveillance 2018   COLONOSCOPY N/A 09/09/2017   Procedure: COLONOSCOPY;  Surgeon: Daneil Dolin, MD;  [facility];  Service: Endoscopy;  Laterality: N/A;   2:15pm   CYSTOSCOPY N/A 02/02/2016   Procedure: CYSTOSCOPY;  Surgeon: Azucena Fallen, MD;  Location: [facility];  Service: Gynecology;  Laterality: N/A;   ESOPHAGOGASTRODUODENOSCOPY N/A 09/16/2014   Procedure: ESOPHAGOGASTRODUODENOSCOPY (EGD);  Surgeon: Daneil Dolin, MD;  [facility];  Service: Endoscopy;  Laterality: N/A;  1030am   KNEE SURGERY Right 2011   arthroscopy   MALONEY DILATION N/A 09/16/2014   Procedure: Venia Minks DILATION;  Surgeon: Daneil Dolin, MD;  [facility];  Service: Endoscopy;  Laterality: N/A;   REPLACEMENT TOTAL KNEE Right 01/19/2013   TOTAL HIP ARTHROPLASTY Right 11/04/2014   Procedure: TOTAL HIP ARTHROPLASTY ANTERIOR APPROACH, Possible Local Bonegrafting Acetabular Cyst;  Surgeon: Marybelle Killings, MD;  Location: [facility];  Service: Orthopedics;  Laterality: Right;   TUBAL LIGATION     VAGINAL HYSTERECTOMY N/A 02/02/2016   Procedure: HYSTERECTOMY VAGINAL;  Surgeon: Azucena Fallen, MD;  Location: [facility];  Service: Gynecology;  Laterality: N/A;    There were no vitals filed for this visit.   Subjective Assessment - 06/29/21 1015     Subjective pt states she has had a lot going on and that's why she missed last week.  STates she did not get around to ordering her garments but is going to first thing Wednesday morning. States she had a  weird white ridge come up after removing her bandaging and has not had any compression on her Lt UE for the past week.    Currently in Pain? No/denies                   LYMPHEDEMA/ONCOLOGY QUESTIONNAIRE - 06/29/21 1314       What other symptoms do you have   Are you Having Heaviness or Tightness Yes    Are you having Pain Yes    Are you having pitting edema Yes    Body Site UE    Is it Hard or Difficult finding clothes that fit Yes    Do you have infections --   no     Lymphedema Stage   Stage STAGE 2 SPONTANEOUSLY IRREVERSIBLE      Lymphedema Assessments   Lymphedema Assessments Upper extremities      Right  Upper Extremity Lymphedema   At Axilla  37 cm    15 cm Proximal to Olecranon Process 35 cm    10 cm Proximal to Olecranon Process 34 cm    Olecranon Process 32.8 cm    15 cm Proximal to Ulnar Styloid Process 32.2 cm    10 cm Proximal to Ulnar Styloid Process 30 cm    Just Proximal to Ulnar Styloid Process 20.4 cm    Across Hand at [employer] 20.5 cm    At [place] of 2nd Digit 7 cm    At [facility] of Thumb 7 cm      Left Upper Extremity Lymphedema   At Axilla  36 cm   was 37.5 on 10/12 evaluation   15 cm Proximal to Olecranon Process 35 cm   was 38   10 cm Proximal to Olecranon Process 35 cm   was 38.2   Olecranon Process 33.5 cm   was 36.8   15 cm Proximal to Ulnar Styloid Process 34 cm   was 35.7   10 cm Proximal to Ulnar Styloid Process 31 cm   was 31.8   Just Proximal to Ulnar Styloid Process 22 cm   was 24.3   Across Hand at [employer] 21 cm   was 22   At [place] of 2nd Digit 6.8 cm   was 7.5   At [facility] of Thumb 7.3 cm   was 7.3                       [facility] Adult PT Treatment/Exercise - 06/29/21 0001       Manual Therapy   Manual Therapy Manual Lymphatic Drainage (MLD);Compression Bandaging    Manual therapy comments Manual complete separate than rest of tx    Manual Lymphatic Drainage (MLD) short neck deep and superfical abdominal, B UE LE not completed due to time restraints.    Compression Bandaging Multilayer short stretch to Lt UE with1//2 foam,                       PT Short Term Goals - 06/03/21 1238       PT SHORT TERM GOAL #1   Title PT to be completing UE exercises to improve lymphatic circulation    Time 3    Period Weeks    Status Achieved    Target Date 06/17/21      PT SHORT TERM GOAL #2   Title PT to have obtained compression garment for LE    Time 3    Period  Weeks    Status On-going               PT Long Term Goals - 06/03/21 1238       PT LONG TERM GOAL #1   Title PT measurment of LT UE to be within  1 cm of  RT to be ready for UE compression garments    Time 6    Period Weeks    Status On-going      PT LONG TERM GOAL #2   Title PT to be I in self manual techniqes.    Time 6    Period Weeks    Status On-going                   Plan - 06/29/21 1308     Clinical Impression Statement Pt remeasured today with slight increase in distal UE (below elbow) and maintaining in upper UE.  Overall, pt has reeduced well since initial evaluation on 10/12.  Pt is to acquire sleeves by first of next week.  Will need education on this and to insure she is indpendent and then may be discharged.  Pt requested initially not to bandage UE, however educated compression was only way to reduce UE along with massage.  Pt then requested to leave hand out.  Educated that her hand would swell and she reports she usually takes the hand peice off anyway as she is unable to keep it on.  Trial of this today and given small rubber gloves to don when painting/completing activities.    Personal Factors and Comorbidities Comorbidity 1;Comorbidity 2    Comorbidities OA, all 4 limbs involved    Examination-Activity Limitations Bathing;Carry;Hygiene/Grooming;Other;Dressing    Examination-Participation Restrictions Cleaning;Occupation;Shop;Yard Work    [employer]chant [employer] officer Evolving/Moderate complexity    Rehab Potential Good    PT Duration 6 weeks    PT Treatment/Interventions Patient/family education;Manual lymph drainage;Compression bandaging    PT Next Visit Plan On days with 45 min complete short neck, deep and superfical abdominal and B UE manual lymph drainage to not shunt to opposite arm or same side LE, Complete compression bandaging to LT UE only.  90 min complete ant/post LE B as well no shunting to UE or opposite leg.   Measure on Wednesdays. Focus on self care and use of garments in the next week then discharge if continues to maintain in UE.             Patient will benefit from skilled  therapeutic intervention in order to improve the following deficits and impairments:  Decreased activity tolerance, Pain, Impaired UE functional use, Increased edema, Decreased skin integrity  Visit Diagnosis: Lymphedema, not elsewhere classified  Other symptoms and signs involving the musculoskeletal system  Chronic left shoulder pain  Stiffness of left shoulder, not elsewhere classified     Problem List Patient Active Problem List   Diagnosis Date Noted   Urinary, incontinence, stress female 02/02/2016   Aftercare following right knee joint replacement surgery 01/21/2015   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/18/2014   History of total hip replacement 11/04/2014   GERD (gastroesophageal reflux disease) 08/29/2014   Tubular adenoma of colon 08/29/2014   Dysphagia, pharyngoesophageal phase 08/29/2014   Anemia, iron deficiency 06/09/2013   Difficulty in walking(719.7) 02/05/2013   Stiffness of right knee 02/05/2013   Bone infection, knee ([place]) 12/19/2012   Microcytic anemia 10/09/2012   Pain due to total knee replacement (HCC) 01/31/2012   Rectal bleeding 11/27/2011  Constipation 11/27/2011   Edema of leg 11/19/2011   Osteoarthritis of right knee 11/19/2011   DERANGEMENT MENISCUS 08/26/2008   KNEE, ARTHRITIS, DEGEN./OSTEO 07/25/2008   KNEE PAIN 07/25/2008    Teena Irani, PTA 06/29/2021, 1:23 PM  [place], [place], [address] Phone: [phone]   Fax:  [phone]  Name: TEREASA YILMAZ MRN: [id number] Date of Birth: [birth year]

## 2021-07-01 ENCOUNTER — Encounter (HOSPITAL_COMMUNITY): Payer: 59 | Admitting: Physical Therapy

## 2021-07-03 ENCOUNTER — Ambulatory Visit (HOSPITAL_COMMUNITY): Payer: 59

## 2021-07-06 ENCOUNTER — Other Ambulatory Visit: Payer: Self-pay

## 2021-07-06 ENCOUNTER — Ambulatory Visit (HOSPITAL_COMMUNITY): Payer: 59 | Admitting: Physical Therapy

## 2021-07-06 DIAGNOSIS — I890 Lymphedema, not elsewhere classified: Secondary | ICD-10-CM | POA: Diagnosis not present

## 2021-07-06 NOTE — Therapy (Signed)
[place], [place], [address] Phone: [phone]   Fax:  [phone]  Physical Therapy Treatment  Patient Details  Name: Kathy Dawson MRN: [id number] Date of Birth: 19-Dec-1955 Referring Provider (PT): Delphina Cahill   Encounter Date: 07/06/2021   PT End of Session - 07/06/21 1647     Visit Number 11    Number of Visits 18    Date for PT Re-Evaluation 07/08/21    Authorization Type [employer] 30 visit limit 7 has been used    Authorization - Visit Number 18    Authorization - Number of Visits 30    PT Start Time 1400    PT Stop Time 1445    PT Time Calculation (min) 45 min    Activity Tolerance Patient tolerated treatment well    Behavior During Therapy [facility] for tasks assessed/performed             Past Medical History:  Diagnosis Date   Anemia    Hx   Anemia, iron deficiency 06/09/2013   Arthritis    knees, hips, elbow   Complication of anesthesia 14 yrs. ago   woke up during the appendectomy   GERD (gastroesophageal reflux disease)    otc med prn   Knee pain     Past Surgical History:  Procedure Laterality Date   ABDOMINAL HYSTERECTOMY     ANTERIOR HIP REVISION Right 11/18/2014   Procedure: Right Total Hip Arthroplasty Femoral Revision-Anteior approach;  Surgeon: Marybelle Killings, MD;  Location: [facility];  Service: Orthopedics;  Laterality: Right;   APPENDECTOMY     bilateral foot surgery     hammer toes   BLADDER SUSPENSION N/A 02/02/2016   Procedure: TRANSVAGINAL TAPE (TVT) Mid-Urethral Sling PROCEDURE;  Surgeon: Azucena Fallen, MD;  Location: [facility];  Service: Gynecology;  Laterality: N/A;   COLONOSCOPY  12/08/2011   [id number] hemorrhoids/Sigmoid diverticulosis. Colonic polyp (tubular adenoma). Surveillance 2018   COLONOSCOPY N/A 09/09/2017   Procedure: COLONOSCOPY;  Surgeon: Daneil Dolin, MD;  [facility];  Service: Endoscopy;  Laterality: N/A;  2:15pm   CYSTOSCOPY N/A 02/02/2016    Procedure: CYSTOSCOPY;  Surgeon: Azucena Fallen, MD;  Location: [facility];  Service: Gynecology;  Laterality: N/A;   ESOPHAGOGASTRODUODENOSCOPY N/A 09/16/2014   Procedure: ESOPHAGOGASTRODUODENOSCOPY (EGD);  Surgeon: Daneil Dolin, MD;  [facility];  Service: Endoscopy;  Laterality: N/A;  1030am   KNEE SURGERY Right 2011   arthroscopy   MALONEY DILATION N/A 09/16/2014   Procedure: Venia Minks DILATION;  Surgeon: Daneil Dolin, MD;  [facility];  Service: Endoscopy;  Laterality: N/A;   REPLACEMENT TOTAL KNEE Right 01/19/2013   TOTAL HIP ARTHROPLASTY Right 11/04/2014   Procedure: TOTAL HIP ARTHROPLASTY ANTERIOR APPROACH, Possible Local Bonegrafting Acetabular Cyst;  Surgeon: Marybelle Killings, MD;  Location: [facility];  Service: Orthopedics;  Laterality: Right;   TUBAL LIGATION     VAGINAL HYSTERECTOMY N/A 02/02/2016   Procedure: HYSTERECTOMY VAGINAL;  Surgeon: Azucena Fallen, MD;  Location: [facility];  Service: Gynecology;  Laterality: N/A;    There were no vitals filed for this visit.   Subjective Assessment - 07/06/21 1402     Subjective PT states she has gotten her LE compression from the outlet, however they did not have her side for the UE>  Pt forgot her bandages.                   LYMPHEDEMA/ONCOLOGY QUESTIONNAIRE - 07/06/21  0001       Right Upper Extremity Lymphedema   At Axilla  37.5 cm    15 cm Proximal to Olecranon Process 36 cm    10 cm Proximal to Olecranon Process 35 cm    Olecranon Process 33 cm    15 cm Proximal to Ulnar Styloid Process 29.5 cm    10 cm Proximal to Ulnar Styloid Process 26.2 cm    Just Proximal to Ulnar Styloid Process 19.3 cm    Across Hand at [employer] 20.5 cm    At [place] of 2nd Digit 7 cm    At [facility] of Thumb 6.8 cm      Left Upper Extremity Lymphedema   At Axilla  35 cm    15 cm Proximal to Olecranon Process 34.8 cm    10 cm Proximal to Olecranon Process 35 cm    Olecranon Process 32.5 cm    15 cm Proximal to Ulnar Styloid  Process 30.8 cm    10 cm Proximal to Ulnar Styloid Process 27.8 cm    Just Proximal to Ulnar Styloid Process 20.5 cm    Across Hand at [employer] 20 cm    At [place] of 2nd Digit 6.8 cm    At [facility] of Thumb 7 cm                        [facility] Adult PT Treatment/Exercise - 07/06/21 0001       Manual Therapy   Manual Therapy Manual Lymphatic Drainage (MLD)    Manual therapy comments Manual complete separate than rest of tx    Manual Lymphatic Drainage (MLD) short neck deep and superfical abdominal, B UE LE not completed due to time restraints.    Compression Bandaging --                       PT Short Term Goals - 06/03/21 1238       PT SHORT TERM GOAL #1   Title PT to be completing UE exercises to improve lymphatic circulation    Time 3    Period Weeks    Status Achieved    Target Date 06/17/21      PT SHORT TERM GOAL #2   Title PT to have obtained compression garment for LE    Time 3    Period Weeks    Status On-going               PT Long Term Goals - 06/03/21 1238       PT LONG TERM GOAL #1   Title PT measurment of LT UE to be within  1 cm of RT to be ready for UE compression garments    Time 6    Period Weeks    Status On-going      PT LONG TERM GOAL #2   Title PT to be I in self manual techniqes.    Time 6    Period Weeks    Status On-going                   Plan - 07/06/21 1649     Clinical Impression Statement PT has not been able to come in, however measurements continue to decrease.  She has gotten her compresssion for the LE, however, elastic theapy stated that they rarely get xl arm compression therefore she will have to go to another source.  Pt will  go to [place], however, they require a prescription and her MD has not returned the request despite sending it over 3 times.  Pt took prescription and stated that she was going to go by the MD office and see if he will sign it.    Personal Factors and  Comorbidities Comorbidity 1;Comorbidity 2    Comorbidities OA, all 4 limbs involved    Examination-Activity Limitations Bathing;Carry;Hygiene/Grooming;Other;Dressing    Examination-Participation Restrictions Cleaning;Occupation;Shop;Yard Work    Merchant [employer] officer Evolving/Moderate complexity    Clinical Decision Making Moderate    Rehab Potential Good    PT Frequency 3x / week    PT Duration 6 weeks    PT Treatment/Interventions Patient/family education;Manual lymph drainage;Compression bandaging    PT Next Visit Plan If pt has obtained compression garment for UE she can be discharged' educate in self manual.    PT Home Exercise Plan shoulder, elbow, wrist and hand as well as diaphramic breathing and lymphatic squeeze.             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   History of total hip replacement 11/04/2014   GERD (gastroesophageal reflux disease) 08/29/2014   Tubular adenoma of colon 08/29/2014   Dysphagia, pharyngoesophageal phase 08/29/2014   Anemia, iron deficiency 06/09/2013   Difficulty in walking(719.7) 02/05/2013   Stiffness of right knee 02/05/2013   Bone infection, knee ([place]) 12/19/2012   Microcytic anemia 10/09/2012   Pain due to total knee replacement (HCC) 01/31/2012   Rectal bleeding 11/27/2011   Constipation 11/27/2011   Edema of leg 11/19/2011   Osteoarthritis of right knee 11/19/2011   DERANGEMENT MENISCUS 08/26/2008   KNEE, ARTHRITIS, DEGEN./OSTEO 07/25/2008   KNEE  PAIN 07/25/2008  Rayetta Humphrey, PT CLT [phone]  07/06/2021, 4:56 PM  [facility] [address] [place], [place], [address] Phone: [phone]   Fax:  [phone]  Name: SHAMON LOBO MRN: [id number] Date of Birth: 1956/08/07

## 2021-07-07 ENCOUNTER — Ambulatory Visit (HOSPITAL_COMMUNITY): Payer: 59

## 2021-07-08 ENCOUNTER — Ambulatory Visit (HOSPITAL_COMMUNITY): Payer: 59

## 2021-07-13 ENCOUNTER — Encounter (HOSPITAL_COMMUNITY): Payer: 59 | Admitting: Physical Therapy

## 2021-07-13 ENCOUNTER — Ambulatory Visit (HOSPITAL_COMMUNITY): Payer: 59 | Admitting: Physical Therapy

## 2021-07-15 ENCOUNTER — Ambulatory Visit (HOSPITAL_COMMUNITY): Payer: 59 | Admitting: Physical Therapy

## 2021-07-15 ENCOUNTER — Telehealth (HOSPITAL_COMMUNITY): Payer: Self-pay | Admitting: Physical Therapy

## 2021-07-15 NOTE — Telephone Encounter (Signed)
She will use the pump today due to a death in the family and will return on Turesday

## 2021-07-21 ENCOUNTER — Telehealth (HOSPITAL_COMMUNITY): Payer: Self-pay

## 2021-07-21 ENCOUNTER — Ambulatory Visit (HOSPITAL_COMMUNITY): Payer: 59 | Attending: Internal Medicine

## 2021-07-21 NOTE — Telephone Encounter (Signed)
No show, called home with an answer though no sound.  Called mobile phone and left message concerning missed apt today.  Reminded next apt date and time with contact number included if needs to cancel/reschedule future apts.    Ihor Austin, LPTA/CLT; Delana Meyer [phone]

## 2021-07-23 ENCOUNTER — Ambulatory Visit (HOSPITAL_COMMUNITY): Payer: 59

## 2021-07-23 ENCOUNTER — Telehealth (HOSPITAL_COMMUNITY): Payer: Self-pay

## 2021-07-23 NOTE — Telephone Encounter (Signed)
No show, called and spoke to pt. who stated she's been sick for the last week.  Pt stated she has been using pump that seems to help wiht edema and has gotten sleeve and wearing daily.  Therapist cancelled remaining apts.  Encouraged pt to contact MD for another referal if feels need to return.    Ihor Austin, LPTA/CLT; [place] [facility] [phone]

## 2021-07-27 ENCOUNTER — Encounter (HOSPITAL_COMMUNITY): Payer: Self-pay | Admitting: Physical Therapy

## 2021-07-27 ENCOUNTER — Other Ambulatory Visit: Payer: Self-pay

## 2021-07-27 ENCOUNTER — Ambulatory Visit
Admission: EM | Admit: 2021-07-27 | Discharge: 2021-07-27 | Disposition: A | Discharge status: Home / Self Care | Payer: 59 | Attending: Family Medicine | Admitting: Family Medicine

## 2021-07-27 DIAGNOSIS — R051 Acute cough: Secondary | ICD-10-CM

## 2021-07-27 DIAGNOSIS — Z20828 Contact with and (suspected) exposure to other viral communicable diseases: Secondary | ICD-10-CM | POA: Diagnosis not present

## 2021-07-27 DIAGNOSIS — R509 Fever, unspecified: Secondary | ICD-10-CM

## 2021-07-27 DIAGNOSIS — J0100 Acute maxillary sinusitis, unspecified: Secondary | ICD-10-CM | POA: Diagnosis not present

## 2021-07-27 MED ORDER — AMOXICILLIN-POT CLAVULANATE 875-125 MG PO TABS: 1.0000 | ORAL_TABLET | Freq: Two times a day (BID) | ORAL | 0 refills | Status: DC

## 2021-07-27 MED ORDER — ALBUTEROL SULFATE HFA 108 (90 BASE) MCG/ACT IN AERS: 2.0000 | INHALATION_SPRAY | Freq: Four times a day (QID) | RESPIRATORY_TRACT | 0 refills | Status: DC | PRN

## 2021-07-27 MED ORDER — HYDROCOD POLST-CPM POLST ER 10-8 MG/5ML PO SUER: 5.0000 mL | Freq: Two times a day (BID) | ORAL | 0 refills | Status: DC | PRN

## 2021-07-27 MED ORDER — DEXAMETHASONE SODIUM PHOSPHATE 10 MG/ML IJ SOLN
10.0000 mg | Freq: Once | INTRAMUSCULAR | Status: AC
  Administered 2021-07-27: 10 mg via INTRAMUSCULAR

## 2021-07-27 NOTE — ED Triage Notes (Signed)
Patient states she has been having cold symptoms such as congestion, headache and scratchy throat for the past 2 weeks.  She states her face and her teeth are hurting. She states no appetite. She states her skin is hurting and having chills.   She states she was taking Nyquil and Dayquil and Robitussin  Denies Fever  Denies Exposure.

## 2021-07-27 NOTE — ED Provider Notes (Signed)
[facility]    CSN: [id number] Arrival date & time: 07/27/21  0909      History   Chief Complaint No chief complaint on file.   HPI Kathy Dawson is a 65 y.o. female.   Presenting today with 2 weeks of progressively worsening nasal congestion, sinus pain and pressure, now dental pain diffusely, low-grade fever, productive cough, chest tightness, fatigue, weakness, body aches.  States she thinks she had the flu initially and was feeling like she was resolving but the past few days have gotten much worse.  Has been taking DayQuil, NyQuil, Robitussin with no relief.  No known history of pulmonary disease.   Past Medical History:  Diagnosis Date   Anemia    Hx   Anemia, iron deficiency 06/09/2013   Arthritis    knees, hips, elbow   Complication of anesthesia 14 yrs. 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   GERD (gastroesophageal reflux disease) 08/29/2014   Tubular adenoma of colon 08/29/2014   Dysphagia, pharyngoesophageal phase 08/29/2014   Anemia, iron deficiency 06/09/2013   Difficulty in walking(719.7) 02/05/2013   Stiffness of right knee 02/05/2013   Bone infection, knee ([place]) 12/19/2012   Microcytic anemia 10/09/2012   Pain due to total knee replacement ([place]) 01/31/2012   Rectal bleeding 11/27/2011   Constipation 11/27/2011   Edema of leg 11/19/2011   Osteoarthritis of right knee 11/19/2011   DERANGEMENT MENISCUS 08/26/2008   KNEE, ARTHRITIS, DEGEN./OSTEO 07/25/2008   KNEE PAIN 07/25/2008    Past Surgical History:  Procedure Laterality Date   ABDOMINAL HYSTERECTOMY     ANTERIOR HIP REVISION Right 11/18/2014   Procedure:  Right Total Hip Arthroplasty Femoral Revision-Anteior approach;  Surgeon: Marybelle Killings, MD;  Location: [facility];  Service: Orthopedics;  Laterality: Right;   APPENDECTOMY     bilateral foot surgery     hammer toes   BLADDER SUSPENSION N/A 02/02/2016   Procedure: TRANSVAGINAL TAPE (TVT) Mid-Urethral Sling PROCEDURE;  Surgeon: Azucena Fallen, MD;  Location: [facility];  Service: Gynecology;  Laterality: N/A;   COLONOSCOPY  12/08/2011   URK:YHCWCBJS hemorrhoids/Sigmoid diverticulosis. Colonic polyp (tubular adenoma). Surveillance 2018   COLONOSCOPY N/A 09/09/2017   Procedure: COLONOSCOPY;  Surgeon: Daneil Dolin, MD;  [facility];  Service: Endoscopy;  Laterality: N/A;  2:15pm   CYSTOSCOPY N/A 02/02/2016   Procedure: CYSTOSCOPY;  Surgeon: Azucena Fallen, MD;  Location: [facility];  Service: Gynecology;  Laterality: N/A;   ESOPHAGOGASTRODUODENOSCOPY N/A 09/16/2014   Procedure: ESOPHAGOGASTRODUODENOSCOPY (EGD);  Surgeon: Daneil Dolin, MD;  [facility];  Service: Endoscopy;  Laterality: N/A;  1030am   KNEE SURGERY Right 2011   arthroscopy   MALONEY DILATION N/A 09/16/2014   Procedure: Venia Minks DILATION;  Surgeon: Daneil Dolin, MD;  [facility];  Service: Endoscopy;  Laterality: N/A;   REPLACEMENT TOTAL KNEE Right 01/19/2013   TOTAL HIP ARTHROPLASTY Right 11/04/2014   Procedure: TOTAL HIP ARTHROPLASTY ANTERIOR APPROACH, Possible Local Bonegrafting Acetabular Cyst;  Surgeon: Marybelle Killings, MD;  Location: [facility];  Service: Orthopedics;  Laterality: Right;   TUBAL LIGATION  VAGINAL HYSTERECTOMY N/A 02/02/2016   Procedure: HYSTERECTOMY VAGINAL;  Surgeon: Azucena Fallen, MD;  Location: [facility];  Service: Gynecology;  Laterality: N/A;    OB History     Gravida  4   Para  3   Term  3   Preterm      AB  1   Living         SAB  1   IAB      Ectopic      Multiple      Live Births               Home Medications    Prior to Admission medications   Medication  Sig Start Date End Date Taking? Authorizing Provider  albuterol (VENTOLIN HFA) 108 (90 Base) MCG/ACT inhaler Inhale 2 puffs into the lungs every 6 (six) hours as needed for wheezing or shortness of breath. 07/27/21  Yes Volney American, PA-C  amoxicillin-clavulanate (AUGMENTIN) 875-125 MG tablet Take 1 tablet by mouth every 12 (twelve) hours. 07/27/21  Yes Volney American, PA-C  chlorpheniramine-HYDROcodone [facility]) 10-8 MG/5ML SUER Take 5 mLs by mouth every 12 (twelve) hours as needed for cough. 07/27/21  Yes Volney American, PA-C  OVER THE COUNTER MEDICATION Hemp oil home grown   Yes [provider]  acetaminophen (TYLENOL) 500 MG tablet Take 1,000 mg by mouth every 6 (six) hours as needed for moderate pain or headache.     [provider]  fluticasone (FLONASE) 50 MCG/ACT nasal spray Place into both nostrils as needed for allergies or rhinitis.    [provider]  meclizine (ANTIVERT) 25 MG tablet Take 1 tablet (25 mg total) by mouth 3 (three) times daily as needed for dizziness. 11/02/20   Veryl Speak, MD  nitrofurantoin, macrocrystal-monohydrate, (MACROBID) 100 MG capsule Take 1 capsule (100 mg total) by mouth 2 (two) times daily. 05/07/20   Lestine Box, PA-C    Family History Family History  Problem Relation Age of Onset   Colon polyps Father    Cancer Father    Heart disease Father    Heart disease Other    Colon cancer Neg Hx     Social History Social History   Tobacco Use   Smoking status: Former    Packs/day: 1.00    Years: 20.00    Pack years: 20.00    Types: Cigarettes    Start date: 60    Quit date: 1995    Years since quitting: 27.9   Smokeless tobacco: Never  Vaping Use   Vaping Use: Never used  Substance Use Topics   Alcohol use: No    Alcohol/week: 0.0 standard drinks   Drug use: No     Allergies   Clindamycin/lincomycin and Prochlorperazine   Review of Systems Review of  Systems Per HPI  Physical Exam Triage Vital Signs ED Triage Vitals  Enc Vitals Group     BP 07/27/21 1120 (!) 139/92     Pulse Rate 07/27/21 1120 100     Resp 07/27/21 1120 18     Temp 07/27/21 1120 99.6 F (37.6 C)     Temp Source 07/27/21 1120 Oral     SpO2 07/27/21 1120 96 %     Weight --      Height --      Head Circumference --      Peak Flow --      Pain Score 07/27/21 1116 10     Pain Loc --  Pain Edu? --      Excl. in [place]? --    No data found.  Updated Vital Signs BP (!) 139/92 (BP Location: Right Arm)   Pulse 100   Temp 99.6 F (37.6 C) (Oral)   Resp 18   SpO2 96%   Visual Acuity Right Eye Distance:   Left Eye Distance:   Bilateral Distance:    Right Eye Near:   Left Eye Near:    Bilateral Near:     Physical Exam Vitals and nursing note reviewed.  Constitutional:      Appearance: Normal appearance.  HENT:     Head: Atraumatic.     Right Ear: Tympanic membrane and external ear normal.     Left Ear: Tympanic membrane and external ear normal.     Nose: Congestion present.     Mouth/Throat:     Mouth: Mucous membranes are moist.     Pharynx: Posterior oropharyngeal erythema present.  Eyes:     Extraocular Movements: Extraocular movements intact.     Conjunctiva/sclera: Conjunctivae normal.  Cardiovascular:     Rate and Rhythm: Normal rate and regular rhythm.     Heart sounds: Normal heart sounds.  Pulmonary:     Effort: Pulmonary effort is normal.     Breath sounds: Wheezing present. No rales.     Comments: Minimal scattered wheezes bilaterally Musculoskeletal:        General: Normal range of motion.     Cervical back: Normal range of motion and neck supple.  Skin:    General: Skin is warm and dry.  Neurological:     Mental Status: She is alert and oriented to person, place, and time.  Psychiatric:        Mood and Affect: Mood normal.        Thought Content: Thought content normal.     UC Treatments / Results  Labs (all labs  ordered are listed, but only abnormal results are displayed) Labs Reviewed  COVID-19, FLU A+B NAA    EKG   Radiology No results found.  Procedures Procedures (including critical care time)  Medications Ordered in UC Medications  dexamethasone (DECADRON) injection 10 mg (has no administration in time range)    Initial Impression / Assessment and Plan / UC Course  I have reviewed the triage vital signs and the nursing notes.  Pertinent labs & imaging results that were available during my care of the patient were reviewed by me and considered in my medical decision making (see chart for details).     Will treat with IM Decadron, Augmentin, Phenergan DM, albuterol inhaler for suspected acute maxillary sinusitis secondary to viral infection now also with wheezing, hacking productive cough that is worsening additionally.  Discussed supportive over-the-counter medications, home care, return precautions.  Final Clinical Impressions(s) / UC Diagnoses   Final diagnoses:  Exposure to the flu  Acute non-recurrent maxillary sinusitis  Fever, unspecified  Acute cough   Discharge Instructions   None    ED Prescriptions     Medication Sig Dispense Auth. Provider   amoxicillin-clavulanate (AUGMENTIN) 875-125 MG tablet Take 1 tablet by mouth every 12 (twelve) hours. 14 tablet Volney American, [place]   albuterol (VENTOLIN HFA) 108 (90 Base) MCG/ACT inhaler Inhale 2 puffs into the lungs every 6 (six) hours as needed for wheezing or shortness of breath. 18 g Volney American, [place]   chlorpheniramine-HYDROcodone [facility]) 10-8 MG/5ML SUER Take 5 mLs by mouth every 12 (twelve) hours as  needed for cough. 100 mL Volney American, [place]      PDMP not reviewed this encounter.   Volney American, [place] 07/27/21 1153

## 2021-07-27 NOTE — Therapy (Signed)
[place], [place], [address] Phone: [phone]   Fax:  [phone]  Patient Details  Name: Kathy Dawson MRN: [id number] Date of Birth: 31-Oct-1955 Referring Provider:  No ref. provider found  Encounter Date: 07/27/2021  PHYSICAL THERAPY DISCHARGE SUMMARY  Visits from Start of Care: 11  Current functional level related to goals / functional outcomes: Improved edema in B UE and B LE    Remaining deficits: Continues to have some edema but feels she can manage it on her own now    Education / Equipment: Pt has a pump as well as compression garments at this time    Patient agrees to discharge. Patient goals were partially met. Patient is being discharged due to being pleased with the current functional level.   Rayetta Humphrey, PT CLT [id number]  07/27/2021, 8:29 AM  [place], [place], [address] Phone: [phone]   Fax:  [phone]

## 2021-07-28 ENCOUNTER — Encounter (HOSPITAL_COMMUNITY): Payer: 59 | Admitting: Physical Therapy

## 2021-07-28 LAB — COVID-19, FLU A+B NAA
Influenza A, NAA: DETECTED — AB
Influenza B, NAA: NOT DETECTED
SARS-CoV-2, NAA: NOT DETECTED

## 2021-07-30 ENCOUNTER — Encounter (HOSPITAL_COMMUNITY): Payer: 59

## 2021-08-04 ENCOUNTER — Encounter (HOSPITAL_COMMUNITY): Payer: 59 | Admitting: Physical Therapy

## 2021-08-06 ENCOUNTER — Encounter (HOSPITAL_COMMUNITY): Payer: 59

## 2021-08-11 ENCOUNTER — Encounter (HOSPITAL_COMMUNITY): Payer: 59

## 2021-08-12 ENCOUNTER — Encounter (HOSPITAL_COMMUNITY): Payer: 59 | Admitting: Physical Therapy

## 2021-08-13 ENCOUNTER — Encounter (HOSPITAL_COMMUNITY): Payer: Self-pay | Admitting: Oncology

## 2021-08-14 ENCOUNTER — Ambulatory Visit (HOSPITAL_COMMUNITY): Payer: 59

## 2021-08-30 ENCOUNTER — Encounter (HOSPITAL_COMMUNITY): Payer: Self-pay | Admitting: Emergency Medicine

## 2021-08-30 ENCOUNTER — Emergency Department (HOSPITAL_COMMUNITY)
Admission: EM | Admit: 2021-08-30 | Discharge: 2021-08-30 | Disposition: A | Discharge status: Home / Self Care | Payer: 59 | Attending: Emergency Medicine | Admitting: Emergency Medicine

## 2021-08-30 ENCOUNTER — Other Ambulatory Visit: Payer: Self-pay

## 2021-08-30 ENCOUNTER — Emergency Department (HOSPITAL_COMMUNITY): Payer: 59

## 2021-08-30 ENCOUNTER — Encounter (HOSPITAL_COMMUNITY): Payer: Self-pay | Admitting: Oncology

## 2021-08-30 DIAGNOSIS — R9389 Abnormal findings on diagnostic imaging of other specified body structures: Secondary | ICD-10-CM | POA: Diagnosis not present

## 2021-08-30 DIAGNOSIS — R030 Elevated blood-pressure reading, without diagnosis of hypertension: Secondary | ICD-10-CM | POA: Insufficient documentation

## 2021-08-30 DIAGNOSIS — R600 Localized edema: Secondary | ICD-10-CM | POA: Diagnosis not present

## 2021-08-30 DIAGNOSIS — R9431 Abnormal electrocardiogram [ECG] [EKG]: Secondary | ICD-10-CM | POA: Diagnosis not present

## 2021-08-30 DIAGNOSIS — R0602 Shortness of breath: Secondary | ICD-10-CM | POA: Insufficient documentation

## 2021-08-30 DIAGNOSIS — R42 Dizziness and giddiness: Secondary | ICD-10-CM | POA: Insufficient documentation

## 2021-08-30 DIAGNOSIS — R079 Chest pain, unspecified: Secondary | ICD-10-CM | POA: Diagnosis not present

## 2021-08-30 DIAGNOSIS — R000 Tachycardia, unspecified: Secondary | ICD-10-CM | POA: Diagnosis not present

## 2021-08-30 DIAGNOSIS — R0789 Other chest pain: Secondary | ICD-10-CM | POA: Diagnosis not present

## 2021-08-30 DIAGNOSIS — I10 Essential (primary) hypertension: Secondary | ICD-10-CM | POA: Diagnosis not present

## 2021-08-30 DIAGNOSIS — R002 Palpitations: Secondary | ICD-10-CM | POA: Diagnosis not present

## 2021-08-30 LAB — CBC
HCT: 41.8 % (ref 36.0–46.0)
Hemoglobin: 13.7 g/dL (ref 12.0–15.0)
MCH: 31.1 pg (ref 26.0–34.0)
MCHC: 32.8 g/dL (ref 30.0–36.0)
MCV: 95 fL (ref 80.0–100.0)
Platelets: 263 10*3/uL (ref 150–400)
RBC: 4.4 MIL/uL (ref 3.87–5.11)
RDW: 13.5 % (ref 11.5–15.5)
WBC: 7.1 10*3/uL (ref 4.0–10.5)
nRBC: 0 % (ref 0.0–0.2)

## 2021-08-30 LAB — BASIC METABOLIC PANEL
Anion gap: 11 (ref 5–15)
BUN: 13 mg/dL (ref 8–23)
CO2: 24 mmol/L (ref 22–32)
Calcium: 9.3 mg/dL (ref 8.9–10.3)
Chloride: 106 mmol/L (ref 98–111)
Creatinine, Ser: 0.7 mg/dL (ref 0.44–1.00)
GFR, Estimated: >60 mL/min (ref >60)
Glucose, Bld: 92 mg/dL (ref 70–99)
Potassium: 3.7 mmol/L (ref 3.5–5.1)
Sodium: 141 mmol/L (ref 135–145)

## 2021-08-30 LAB — TROPONIN I (HIGH SENSITIVITY)
Troponin I (High Sensitivity): 4 ng/L (ref <18)
Troponin I (High Sensitivity): 4 ng/L (ref <18)

## 2021-08-30 LAB — D-DIMER, QUANTITATIVE: D-Dimer, Quant: 1.9 ug/mL-FEU — ABNORMAL HIGH (ref 0.00–0.50)

## 2021-08-30 LAB — BRAIN NATRIURETIC PEPTIDE: B Natriuretic Peptide: 75.2 pg/mL (ref 0.0–100.0)

## 2021-08-30 MED ORDER — IOHEXOL 350 MG/ML SOLN: 100.0000 mL | Freq: Once | INTRAVENOUS | Status: DC | PRN

## 2021-08-30 MED ORDER — ALUM & MAG HYDROXIDE-SIMETH 200-200-20 MG/5ML PO SUSP
30.0000 mL | Freq: Once | ORAL | Status: AC
  Administered 2021-08-30: 30 mL via ORAL
  Filled 2021-08-30: qty 30

## 2021-08-30 MED ORDER — IOHEXOL 350 MG/ML SOLN
100.0000 mL | Freq: Once | INTRAVENOUS | Status: AC | PRN
  Administered 2021-08-30: 100 mL via INTRAVENOUS

## 2021-08-30 MED ORDER — LIDOCAINE VISCOUS HCL 2 % MT SOLN
15.0000 mL | Freq: Once | OROMUCOSAL | Status: AC
Start: 2021-08-30 — End: 2021-08-30
  Administered 2021-08-30: 15 mL via ORAL
  Filled 2021-08-30: qty 15

## 2021-08-30 MED ORDER — SODIUM CHLORIDE 0.9 % IV BOLUS
1000.0000 mL | Freq: Once | INTRAVENOUS | Status: AC
  Administered 2021-08-30: 1000 mL via INTRAVENOUS

## 2021-08-30 MED ORDER — KETOROLAC TROMETHAMINE 15 MG/ML IJ SOLN
15.0000 mg | Freq: Once | INTRAMUSCULAR | Status: AC
  Administered 2021-08-30: 15 mg via INTRAVENOUS
  Filled 2021-08-30: qty 1

## 2021-08-30 MED ORDER — FAMOTIDINE IN NACL 20-0.9 MG/50ML-% IV SOLN
20.0000 mg | Freq: Once | INTRAVENOUS | Status: AC
Start: 2021-08-30 — End: 2021-08-30
  Administered 2021-08-30: 20 mg via INTRAVENOUS
  Filled 2021-08-30: qty 50

## 2021-08-30 NOTE — ED Notes (Signed)
Discharge instructions and follow up care reviewed and explained. Pt verbalized understanding. Pt denied pain on departure and ambulated without need of assistance.

## 2021-08-30 NOTE — ED Provider Notes (Signed)
[facility] EMERGENCY DEPARTMENT Provider Note   CSN: [id number] Arrival date & time: 08/30/21  1210     History  Chief Complaint  Patient presents with   Chest Pain    Kathy Dawson is a 66 y.o. female.  This is a 66 y.o. female  with significant medical history as below, including lymphedema who presents to the ED with complaint of chest pain, lightheadedness  Location: Midsternal Duration: Around 12 hours Onset: Gradual Timing: Intermittent Description: Heaviness, pressure, warmth sensation Severity: Mild Exacerbating/Alleviating Factors: Worse with walking, deep inspiration Associated Symptoms:  lightheadedness, chest pressure Pertinent Negatives: No fevers, chills, nausea, vomiting, change to daily foods, no change in bowel or bladder function.  No recent travel or sick contacts.  Context: Patient with chronic lymphedema.  She had an episode of chest tightness, heaviness, warmth last night which did resolve.  She was at church today when she felt warmth, tightness, pressure to her chest.  Lightheadedness.  She attempted drink some water to help with her symptoms.  Her blood pressure was checked while at church and was systolic greater than 037 which is reports she was very anxious/"worked up" at that time.  Denies history of hypertension.  Her pain has improved since the onset.  She remains mildly symptomatic.  She was given aspirin prior to arrival    Past Medical History: No date: Anemia     Comment:  Hx 06/09/2013: Anemia, iron deficiency No date: Arthritis     Comment:  knees, hips, elbow 14 yrs. ago: Complication of anesthesia     Comment:  woke up during the appendectomy No date: GERD (gastroesophageal reflux disease)     Comment:  otc med prn No date: Knee pain     The history is provided by the patient. No language interpreter was used.  Chest Pain Associated symptoms: shortness of breath   Associated symptoms: no abdominal pain, no  cough, no dysphagia, no fever, no headache, no nausea, no palpitations and no vomiting    HPI: A 66 year old patient with a history of obesity presents for evaluation of chest pain. Initial onset of pain was approximately 3-6 hours ago. The patient's chest pain is described as heaviness/pressure/tightness and is not worse with exertion. The patient's chest pain is middle- or left-sided, is not well-localized, is not sharp and does not radiate to the arms/jaw/neck. The patient does not complain of nausea and denies diaphoresis. The patient has no history of stroke, has no history of peripheral artery disease, has not smoked in the past 90 days, denies any history of treated diabetes, has no relevant family history of coronary artery disease (first degree relative at less than age 2), is not hypertensive and has no history of hypercholesterolemia.   Home Medications Prior to Admission medications   Medication Sig Start Date End Date Taking? Authorizing Provider  acetaminophen (TYLENOL) 500 MG tablet Take 1,000 mg by mouth 2 (two) times daily.   Yes [provider]  calcium carbonate (TUMS - DOSED IN MG ELEMENTAL CALCIUM) 500 MG chewable tablet Chew 1 tablet by mouth daily as needed for indigestion or heartburn.   Yes [provider]  albuterol (VENTOLIN HFA) 108 (90 Base) MCG/ACT inhaler Inhale 2 puffs into the lungs every 6 (six) hours as needed for wheezing or shortness of breath. Patient not taking: Reported on 08/30/2021 07/27/21   Volney American, PA-C  amoxicillin-clavulanate (AUGMENTIN) 875-125 MG tablet Take 1 tablet by mouth every 12 (twelve) hours. Patient not  taking: Reported on 08/30/2021 07/27/21   Volney American, PA-C  chlorpheniramine-HYDROcodone [facility]) 10-8 MG/5ML SUER Take 5 mLs by mouth every 12 (twelve) hours as needed for cough. Patient not taking: Reported on 08/30/2021 07/27/21   Volney American, PA-C  meclizine (ANTIVERT)  25 MG tablet Take 1 tablet (25 mg total) by mouth 3 (three) times daily as needed for dizziness. Patient not taking: Reported on 08/30/2021 11/02/20   Veryl Speak, MD      Allergies    Clindamycin/lincomycin and Prochlorperazine    Review of Systems   Review of Systems  Constitutional:  Negative for chills and fever.  HENT:  Negative for facial swelling and trouble swallowing.   Eyes:  Negative for photophobia and visual disturbance.  Respiratory:  Positive for shortness of breath. Negative for cough.   Cardiovascular:  Positive for chest pain. Negative for palpitations.  Gastrointestinal:  Negative for abdominal pain, nausea and vomiting.  Endocrine: Negative for polydipsia and polyuria.  Genitourinary:  Negative for difficulty urinating and hematuria.  Musculoskeletal:  Negative for gait problem and joint swelling.  Skin:  Negative for pallor and rash.  Neurological:  Positive for light-headedness. Negative for syncope and headaches.  Psychiatric/Behavioral:  Negative for agitation and confusion.    Physical Exam Updated Vital Signs BP (!) 153/80 (BP Location: Right Wrist)    Pulse 67    Temp 98.3 F (36.8 C) (Oral)    Resp 16    SpO2 99%  Physical Exam Vitals and nursing note reviewed.  Constitutional:      General: She is not in acute distress.    Appearance: Normal appearance. She is well-developed. She is not ill-appearing.  HENT:     Head: Normocephalic and atraumatic.     Right Ear: External ear normal.     Left Ear: External ear normal.     Nose: Nose normal.     Mouth/Throat:     Mouth: Mucous membranes are moist.  Eyes:     General: No scleral icterus.       Right eye: No discharge.        Left eye: No discharge.     Extraocular Movements: Extraocular movements intact.     Pupils: Pupils are equal, round, and reactive to light.  Cardiovascular:     Rate and Rhythm: Normal rate and regular rhythm.     Pulses: Normal pulses.          Radial pulses are 2+ on the  right side and 2+ on the left side.       Dorsalis pedis pulses are 2+ on the right side and 2+ on the left side.     Heart sounds: Normal heart sounds. Heart sounds not distant. No murmur heard. Pulmonary:     Effort: Pulmonary effort is normal. No tachypnea or respiratory distress.     Breath sounds: Normal breath sounds. No stridor. No decreased breath sounds or wheezing.  Abdominal:     General: Abdomen is flat.     Tenderness: There is no abdominal tenderness.  Musculoskeletal:        General: Normal range of motion.     Cervical back: Normal range of motion.     Right lower leg: Edema present.     Left lower leg: Edema present.  Skin:    General: Skin is warm and dry.     Capillary Refill: Capillary refill takes less than 2 seconds.  Neurological:     Mental Status:  She is alert and oriented to person, place, and time.     GCS: GCS eye subscore is 4. GCS verbal subscore is 5. GCS motor subscore is 6.     Cranial Nerves: Cranial nerves 2-12 are intact. No dysarthria or facial asymmetry.     Sensory: Sensation is intact.     Motor: Motor function is intact.     Coordination: Coordination is intact.     Gait: Gait is intact.  Psychiatric:        Mood and Affect: Mood normal.        Behavior: Behavior normal.    ED Results / Procedures / Treatments   Labs (all labs ordered are listed, but only abnormal results are displayed) Labs Reviewed  D-DIMER, QUANTITATIVE - Abnormal; Notable for the following components:      Result Value   D-Dimer, Quant 1.90 (*)    All other components within normal limits  BASIC METABOLIC PANEL  CBC  BRAIN NATRIURETIC PEPTIDE  TROPONIN I (HIGH SENSITIVITY)  TROPONIN I (HIGH SENSITIVITY)    EKG EKG Interpretation  Date/Time:  Sunday August 30 2021 12:52:48 EST Ventricular Rate:  75 PR Interval:  148 QRS Duration: 76 QT Interval:  392 QTC Calculation: 437 R Axis:   -48 Text Interpretation: Normal sinus rhythm Left anterior fascicular  block Cannot rule out Inferior infarct (masked by fascicular block?) , age undetermined When compared with ECG of 01-Nov-2020 22:56, PREVIOUS ECG IS PRESENT Interpretation limited secondary to artifact similar to prior Confirmed by Wynona Dove ([id number]) on 08/30/2021 2:21:16 PM  Radiology DG Chest 2 View  Result Date: 08/30/2021 CLINICAL DATA:  66 year old female with history of chest pain. EXAM: CHEST - 2 VIEW COMPARISON:  Chest x-ray 11/01/2020. FINDINGS: Lung volumes are normal. No consolidative airspace disease. No pleural effusions. No pneumothorax. No pulmonary nodule or mass noted. Pulmonary vasculature and the cardiomediastinal silhouette are within normal limits. IMPRESSION: No radiographic evidence of acute cardiopulmonary disease. Electronically Signed   By: Vinnie Langton M.D.   On: 08/30/2021 13:23   CT Angio Chest PE W and/or Wo Contrast  Result Date: 08/30/2021 CLINICAL DATA:  Intermittent chest pressure for 1 week. Chest pain and palpitations. Pulmonary embolism (PE) suspected, positive D-dimer EXAM: CT ANGIOGRAPHY CHEST WITH CONTRAST TECHNIQUE: Multidetector CT imaging of the chest was performed using the standard protocol during bolus administration of intravenous contrast. Multiplanar CT image reconstructions and MIPs were obtained to evaluate the vascular anatomy. RADIATION DOSE REDUCTION: This exam was performed according to the departmental dose-optimization program which includes automated exposure control, adjustment of the mA and/or kV according to patient size and/or use of iterative reconstruction technique. CONTRAST:  160mL OMNIPAQUE IOHEXOL 350 MG/ML SOLN COMPARISON:  CT 03/07/2020 FINDINGS: Cardiovascular: Satisfactory opacification of the pulmonary arteries to the segmental level. No evidence of pulmonary embolism. Central pulmonary arteries are mildly dilated. Thoracic aorta is nonaneurysmal. Heart size is upper limits of normal. No pericardial effusion. Mediastinum/Nodes:  Borderline enlarged precarinal lymph node. No axillary or hilar lymphadenopathy. Thyroid, trachea, and esophagus within normal limits. Lungs/Pleura: No focal airspace consolidation. No pleural effusion or pneumothorax. Upper Abdomen: Slight reflux of contrast into the IVC and hepatic veins. No acute findings within the included upper abdomen. Musculoskeletal: No chest wall abnormality. No acute or significant osseous findings. Review of the MIP images confirms the above findings. IMPRESSION: 1. No evidence of acute pulmonary embolism. 2. Lungs are clear. 3. Central pulmonary arteries are mildly dilated, which can be seen in the setting of  pulmonary arterial hypertension. 4. Slight reflux of contrast into the IVC and hepatic veins, which can be seen in the setting of right heart dysfunction. 5. Borderline enlarged precarinal lymph node, nonspecific and may be reactive. Electronically Signed   By: Davina Poke D.O.   On: 08/30/2021 16:29    Procedures Procedures    Medications Ordered in ED Medications  iohexol (OMNIPAQUE) 350 MG/ML injection 100 mL (has no administration in time range)  sodium chloride 0.9 % bolus 1,000 mL (0 mLs Intravenous Stopped 08/30/21 1600)  famotidine (PEPCID) IVPB 20 mg premix (0 mg Intravenous Stopped 08/30/21 1544)  alum & mag hydroxide-simeth (MAALOX/MYLANTA) 200-200-20 MG/5ML suspension 30 mL (30 mLs Oral Given 08/30/21 1509)    And  lidocaine (XYLOCAINE) 2 % viscous mouth solution 15 mL (15 mLs Oral Given 08/30/21 1510)  ketorolac (TORADOL) 15 MG/ML injection 15 mg (15 mg Intravenous Given 08/30/21 1507)  iohexol (OMNIPAQUE) 350 MG/ML injection 100 mL (100 mLs Intravenous Contrast Given 08/30/21 1607)    ED Course/ Medical Decision Making/ A&P   HEAR Score: 3                       Medical Decision Making   CC: Chest pain, light headed   This patient complains of above; this involves an extensive number of treatment options and is a complaint that carries with it  a high risk of complications and morbidity. Vital signs were reviewed. Serious etiologies considered.  Record review:  Previous records obtained and reviewed    Work up as above, notable for:  Labs & imaging results that were available during my care of the patient were reviewed by me and considered in my medical decision making.   I ordered imaging studies which included chest x-ray and I independently visualized and interpreted imaging which showed no acute process  Labs reviewed - D dimer was +, CTPE ordered and personally viewed; no PE. Agree with radiology interpretation of imaging.   EKG without evidence of STEMI.  Cardiac monitoring reviewed myself demonstrates normal sinus rhythm.  Management: She has already received aspirin, will give analgesics, GI cocktail   Reassessment:  Patient reports that she is feeling back to her baseline.   The patient's chest pain is not suggestive of cardiac ischemia, aortic dissection, pericarditis, myocarditis, pneumothorax, pneumonia, Zoster, or esophageal perforation, or other serious etiology.  Historically not abrupt in onset, tearing or ripping, pulses symmetric. EKG nonspecific for ischemia/infarction. No dysrhythmias, brugada, WPW, prolonged QT noted. Troponin negative x2. CXR reviewed. Labs without demonstration of acute pathology unless otherwise noted above. Low HEART Score: 0-3 points (0.9-1.7% risk of MACE). Given the extremely low risk of these diagnoses further testing and evaluation for these possibilities does not appear to be indicated at this time. Patient in no distress and overall condition improved here in the ED. Detailed discussions were had with the patient regarding current findings, and need for close f/u with PCP or on call doctor. The patient has been instructed to return immediately if the symptoms worsen in any way for re-evaluation. Patient verbalized understanding and is in agreement with current care plan. All questions  answered prior to discharge.       This chart was dictated using voice recognition software.  Despite best efforts to proofread,  errors can occur which can change the documentation meaning.         Final Clinical Impression(s) / ED Diagnoses Final diagnoses:  Elevated blood pressure reading  Atypical chest  pain  Abnormal CT scan, chest    Rx / DC Orders ED Discharge Orders     None         Jeanell Sparrow, DO 08/30/21 1925

## 2021-08-30 NOTE — ED Provider Triage Note (Signed)
Emergency Medicine Provider Triage Evaluation Note  Kathy Dawson , a 66 y.o. female  was evaluated in triage.  Pt complains of diffuse chest pressure started while she is discharged.  Initially started as a warm sensation that began with pressure.  She states that it feels like she needs to take her bra off.  He is reporting unexplained nausea as well as several months.  No vomiting.  No shortness of breath.  Currently improving.  Review of Systems  Positive:  Negative: See above   Physical Exam  There were no vitals taken for this visit. Gen:   Awake, no distress   Resp:  Normal effort  MSK:   Moves extremities without difficulty  Other:    Medical Decision Making  Medically screening exam initiated at 12:43 PM.  Appropriate orders placed.  Kathy Dawson was informed that the remainder of the evaluation will be completed by another provider, this initial triage assessment does not replace that evaluation, and the importance of remaining in the ED until their evaluation is complete.     Myna Bright [place], [place] 08/30/21 1244

## 2021-08-30 NOTE — ED Notes (Signed)
Patient transported to CT 

## 2021-08-30 NOTE — Discharge Instructions (Addendum)
Please follow-up with your PCP regarding elevated blood pressure reading and CAT scan findings from today  It was a pleasure caring for you today in the emergency department.  Please return to the emergency department for any worsening or worrisome symptoms.

## 2021-08-30 NOTE — ED Triage Notes (Signed)
Pt to triage via [facility] from church.  Reports intermittent chest pressure and warmth x 1 week.  Today chest pain episode more intense with palpitations.  ASA 324mg  prior to EMS arrival   EMS- BP 212/122 (no history of HTN) 188/110 (repeat)

## 2021-09-01 ENCOUNTER — Encounter (HOSPITAL_BASED_OUTPATIENT_CLINIC_OR_DEPARTMENT_OTHER): Payer: Self-pay | Admitting: Physician Assistant

## 2021-09-01 ENCOUNTER — Ambulatory Visit (HOSPITAL_BASED_OUTPATIENT_CLINIC_OR_DEPARTMENT_OTHER): Payer: 59 | Admitting: Physician Assistant

## 2021-09-01 ENCOUNTER — Other Ambulatory Visit: Payer: Self-pay

## 2021-09-01 ENCOUNTER — Ambulatory Visit (HOSPITAL_BASED_OUTPATIENT_CLINIC_OR_DEPARTMENT_OTHER): Payer: 59 | Admitting: Family

## 2021-09-01 ENCOUNTER — Telehealth: Payer: Self-pay | Admitting: Interventional Cardiology

## 2021-09-01 VITALS — BP 130/84 | HR 88 | Ht 63.0 in | Wt 213.8 lb

## 2021-09-01 DIAGNOSIS — I10 Essential (primary) hypertension: Secondary | ICD-10-CM | POA: Diagnosis not present

## 2021-09-01 DIAGNOSIS — K219 Gastro-esophageal reflux disease without esophagitis: Secondary | ICD-10-CM

## 2021-09-01 DIAGNOSIS — I890 Lymphedema, not elsewhere classified: Secondary | ICD-10-CM | POA: Diagnosis not present

## 2021-09-01 DIAGNOSIS — R072 Precordial pain: Secondary | ICD-10-CM

## 2021-09-01 DIAGNOSIS — R0602 Shortness of breath: Secondary | ICD-10-CM

## 2021-09-01 MED ORDER — METOPROLOL TARTRATE 100 MG PO TABS: 100.0000 mg | ORAL_TABLET | Freq: Two times a day (BID) | ORAL | 0 refills | Status: DC

## 2021-09-01 MED ORDER — METOPROLOL TARTRATE 100 MG PO TABS: 100.0000 mg | ORAL_TABLET | Freq: Once | ORAL | 0 refills | Status: DC

## 2021-09-01 NOTE — Telephone Encounter (Signed)
Pt c/o medication issue:  1. Name of Medication: metoprolol tartrate (LOPRESSOR) 100 MG tablet  2. How are you currently taking this medication (dosage and times per day)? Take 1 tablet (100 mg total) by mouth 2 (two) times daily.  3. Are you having a reaction (difficulty breathing--STAT)? no  4. What is your medication issue? Have question about the dosage

## 2021-09-01 NOTE — Progress Notes (Signed)
Office Visit    Patient Name: Kathy Dawson Date of Encounter: 09/01/2021  PCP:  Celene Squibb, MD   [facility]  Cardiologist:  Larae Grooms, MD  Advanced Practice Provider:  No care team member to display Electrophysiologist:  None    Chief Complaint    Kathy Dawson is a 66 y.o. female with a hx of lymphedema, anemia, GERD, atypical chest pain, shortness of breath, palpitations presents today for ED follow-up.  Past Medical History    Past Medical History:  Diagnosis Date   Anemia    Hx   Anemia, iron deficiency 06/09/2013   Arthritis    knees, hips, elbow   Complication of anesthesia 14 yrs. ago   woke up during the appendectomy   GERD (gastroesophageal reflux disease)    otc med prn   Knee pain    Past Surgical History:  Procedure Laterality Date   ABDOMINAL HYSTERECTOMY     ANTERIOR HIP REVISION Right 11/18/2014   Procedure: Right Total Hip Arthroplasty Femoral Revision-Anteior approach;  Surgeon: Marybelle Killings, MD;  Location: [facility];  Service: Orthopedics;  Laterality: Right;   APPENDECTOMY     bilateral foot surgery     hammer toes   BLADDER SUSPENSION N/A 02/02/2016   Procedure: TRANSVAGINAL TAPE (TVT) Mid-Urethral Sling PROCEDURE;  Surgeon: Azucena Fallen, MD;  Location: [facility];  Service: Gynecology;  Laterality: N/A;   COLONOSCOPY  12/08/2011   LFY:BOFBPZWC hemorrhoids/Sigmoid diverticulosis. Colonic polyp (tubular adenoma). Surveillance 2018   COLONOSCOPY N/A 09/09/2017   Procedure: COLONOSCOPY;  Surgeon: Daneil Dolin, MD;  [facility];  Service: Endoscopy;  Laterality: N/A;  2:15pm   CYSTOSCOPY N/A 02/02/2016   Procedure: CYSTOSCOPY;  Surgeon: Azucena Fallen, MD;  Location: [facility];  Service: Gynecology;  Laterality: N/A;   ESOPHAGOGASTRODUODENOSCOPY N/A 09/16/2014   Procedure: ESOPHAGOGASTRODUODENOSCOPY (EGD);  Surgeon: Daneil Dolin, MD;  [facility];  Service: Endoscopy;  Laterality: N/A;  1030am   KNEE  SURGERY Right 2011   arthroscopy   MALONEY DILATION N/A 09/16/2014   Procedure: Venia Minks DILATION;  Surgeon: Daneil Dolin, MD;  [facility];  Service: Endoscopy;  Laterality: N/A;   REPLACEMENT TOTAL KNEE Right 01/19/2013   TOTAL HIP ARTHROPLASTY Right 11/04/2014   Procedure: TOTAL HIP ARTHROPLASTY ANTERIOR APPROACH, Possible Local Bonegrafting Acetabular Cyst;  Surgeon: Marybelle Killings, MD;  Location: [facility];  Service: Orthopedics;  Laterality: Right;   TUBAL LIGATION     VAGINAL HYSTERECTOMY N/A 02/02/2016   Procedure: HYSTERECTOMY VAGINAL;  Surgeon: Azucena Fallen, MD;  Location: [facility];  Service: Gynecology;  Laterality: N/A;    Allergies  Allergies  Allergen Reactions   Clindamycin/Lincomycin Hives and Itching   Prochlorperazine Other (See Comments)    Seizure like symptoms    History of Present Illness    Kathy Dawson is a 66 y.o. female with a hx of lymphedema, anemia, GERD, atypical chest pain, shortness of breath, palpitations presents today for ED follow-up.  She was seen in the ER July 2021 for presyncope.  At this time she was experiencing a dizzy sensation characterized by spinning in her head and lightheadedness.  She still feeling funny all over.  She has been taking her usual medications.  She had COVID earlier in the year but had not had any of the vaccines.  She had had some intermittent shaking troponin was negative.  She had a CT scan at that time which ruled  out PE.  You are lightheaded and dizzy CT scan rule out PE. Echocardiogram a month later showed LVEF 60 to 65%, Grade I DD, no significant valve disease.  BP was intermittently high at this time.  She felt like she was unable to focus with her vision.  She had palpitations lasting up to 10 to 15 minutes to several hours.  She also reported some shortness of breath at this time.  She does have a family history of CAD in her father, grandparents on both sides, and uncle.  A 14-day Zio patch was ordered and she was  instructed to stay hydrated.  Today, she has multiple complaints.  She was in the ED recently with chest pain which she has had since Saturday.  She explains the pain as a chest heaviness.  It started in her jaw and teeth and then was in her chest.  She also had some vision changes where she felt like she could not focus.  She states that this time her blood pressure was hypertensive with a systolic reading of 277.  In the ED troponins were negative.  CT scan to rule out PE was negative.  In addition to periods of hypertension she is also had periods of hypotension.  When she took her blood pressure last night it was 99 systolic and she was not feeling right.  She has noticed increased shortness of breath especially with activity and with talking.  This is gotten worse over the last couple of months.  She states she does have a smoking history but has quit over 20 years ago.  She does endorse palpitations but they are not as severe as they have been in the past and they usually do not bother her.  Her biggest concern is the possibility of coronary disease because she has such a strong family history and risk factors.  No edema, orthopnea, PND.   EKGs/Labs/Other Studies Reviewed:   The following studies were reviewed today:  08/30/2021  IMPRESSION: 1. No evidence of acute pulmonary embolism. 2. Lungs are clear. 3. Central pulmonary arteries are mildly dilated, which can be seen in the setting of pulmonary arterial hypertension. 4. Slight reflux of contrast into the IVC and hepatic veins, which can be seen in the setting of right heart dysfunction. 5. Borderline enlarged precarinal lymph node, nonspecific and may be reactive.     Electronically Signed   By: Davina Poke D.O.   On: 08/30/2021 16:29    EKG:  EKG is not  ordered today.  Recent Labs: 08/30/2021: B Natriuretic Peptide 75.2; BUN 13; Creatinine, Ser 0.70; Hemoglobin 13.7; Platelets 263; Potassium 3.7; Sodium 141  Recent  Lipid Panel    Component Value Date/Time   CHOL 165 10/31/2015 0946   TRIG 84 10/31/2015 0946   HDL 44 (L) 10/31/2015 0946   CHOLHDL 3.8 10/31/2015 0946   VLDL 17 10/31/2015 0946   LDLCALC 104 10/31/2015 0946     Home Medications   Current Meds  Medication Sig   acetaminophen (TYLENOL) 500 MG tablet Take 1,000 mg by mouth 2 (two) times daily.   Ascorbic Acid (VITAMIN C) 100 MG tablet Take 100 mg by mouth daily.   cholecalciferol (VITAMIN D3) 25 MCG (1000 UNIT) tablet Take 1,000 Units by mouth daily.   metoprolol tartrate (LOPRESSOR) 100 MG tablet Take 1 tablet (100 mg total) by mouth 2 (two) times daily.   vitamin B-12 (CYANOCOBALAMIN) 100 MCG tablet Take 100 mcg by mouth daily.  Review of Systems      All other systems reviewed and are otherwise negative except as noted above.  Physical Exam    VS:  BP 130/84    Pulse 88    Ht 5\' 3"  (1.6 m)    Wt 213 lb 12.8 oz (97 kg)    SpO2 97%    BMI 37.87 kg/m  , BMI Body mass index is 37.87 kg/m.  Wt Readings from Last 3 Encounters:  09/01/21 213 lb 12.8 oz (97 kg)  02/20/21 215 lb (97.5 kg)  11/01/20 205 lb (93 kg)     GEN: Well nourished, well developed, in no acute distress. HEENT: normal. Neck: Supple, no JVD, carotid bruits, or masses. Cardiac: RRR, no murmurs, rubs, or gallops. No clubbing, cyanosis, edema.  Radials/PT 2+ and equal bilaterally.  Respiratory:  Respirations regular and unlabored, clear to auscultation bilaterally. GI: Soft, nontender, nondistended. MS: No deformity or atrophy. Skin: Warm and dry, no rash. Neuro:  Strength and sensation are intact. Psych: Normal affect.  Assessment & Plan    Lymphadema -She states this is better than it has been. She wears a sleeve on her left arm which works well for her and well as compression socks  2. SOB -Most associated with activity and talking -We will order an echocardiogram today to rule out cardiac cause -Recent lab work done in the ED reviewed without  any concerns  3. Chest pain -nonspecific and not necessary associated with exertion, she did have associated jaw and tooth pain on the right side -with strong family history and risk factors for coronary disease will order cardiac CT scan  4. Hypertension -Extremely variable with SBP 188-99 mmHg -She feels especially bad when it is really high over the low -Currently not on any antihypertensives -I have asked the patient to record her blood pressure at the same time of day for the next few weeks and record these values.  She is to bring the recorded values in at her next appointment.  5. GERD -she watches her diet and has not had indigestion is over a week  -the chest pain is a different feeling from the indigestion she has felt in the past  6. Hx of Hyperlipidemia -no recent values -will plan to repeat LFTs and Lipids today    Disposition: Follow up 1 month with Larae Grooms, MD or APP.  Signed, Elgie Collard, PA-C 09/01/2021, 12:25 PM [facility]

## 2021-09-01 NOTE — Patient Instructions (Addendum)
Medication Instructions:  Your Physician recommend you continue on your current medication as directed.    *If you need a refill on your cardiac medications before your next appointment, please call your pharmacy*   Lab Work: Your physician recommends that you return for lab work Within the next week for a Fasting Lipid Panel, Liver Function Tests and BMET  You may come to the...   [facility] (3rd floor) [address] [place], [place] [address]  Open: 8am-Noon and 1pm-4:30pm   [facility] [place]- Any location  **no appointments needed**  If you have labs (blood work) drawn today and your tests are completely normal, you will receive your results only by: [employer] (if you have MyChart) OR A paper copy in the mail If you have any lab test that is abnormal or we need to change your treatment, we will call you to review the results.   Testing/Procedures: Your physician has requested that you have an echocardiogram. Echocardiography is a painless test that uses sound waves to create images of your heart. It provides your doctor with information about the size and shape of your heart and how well your hearts chambers and valves are working. This procedure takes approximately one hour. There are no restrictions for this procedure. [place] cardiac CT will be scheduled at one of the below locations:   [facility] [address] [place], [place] [address] [phone]   If scheduled at [facility], please arrive at the [facility] main entrance (entrance A) of [facility] 30 minutes prior to test start time. You can use the FREE valet parking offered at the main entrance (encouraged to control the heart rate for the test) Proceed to the [facility] Radiology Department (first floor) to check-in and test prep.  Please follow these  instructions carefully (unless otherwise directed):  On the Night Before the Test: Be sure to Drink plenty of water. Do not consume any caffeinated/decaffeinated beverages or chocolate 12 hours prior to your test. Do not take any antihistamines 12 hours prior to your test.  On the Day of the Test: Drink plenty of water until 1 hour prior to the test. Do not eat any food 4 hours prior to the test. You may take your regular medications prior to the test.  Take metoprolol (Lopressor) two hours prior to test. FEMALES- please wear underwire-free bra if available, avoid dresses & tight clothing      After the Test: Drink plenty of water. After receiving IV contrast, you may experience a mild flushed feeling. This is normal. On occasion, you may experience a mild rash up to 24 hours after the test. This is not dangerous. If this occurs, you can take Benadryl 25 mg and increase your fluid intake. If you experience trouble breathing, this can be serious. If it is severe call 911 IMMEDIATELY. If it is mild, please call our office. If you take any of these medications: Glipizide/Metformin, Avandament, Glucavance, please do not take 48 hours after completing test unless otherwise instructed.  We will call to schedule your test 2-4 weeks out understanding that some insurance companies will need an authorization prior to the service being performed.   For non-scheduling related questions, please contact the cardiac imaging nurse navigator should you have any questions/concerns: Marchia Bond, Cardiac Imaging Nurse Navigator Gordy Clement, Cardiac Imaging Nurse Navigator [facility]  Heart and Vascular Services Direct Office Dial: [phone]   For scheduling needs, including cancellations and rescheduling, please call [place], [phone].    Follow-Up: At [facility], you and your health needs are our priority.  As part of our continuing mission to provide you with exceptional heart care, we  have created designated Provider Care Teams.  These Care Teams include your primary Cardiologist (physician) and Advanced Practice Providers (APPs -  Physician Assistants and Nurse Practitioners) who all work together to provide you with the care you need, when you need it.  We recommend signing up for the patient portal called "MyChart".  Sign up information is provided on this After Visit Summary.  MyChart is used to connect with patients for Virtual Visits (Telemedicine).  Patients are able to view lab/test results, encounter notes, upcoming appointments, etc.  Non-urgent messages can be sent to your provider as well.   To learn more about what you can do with MyChart, go to [email or web address].    Your next appointment:   1 month(s)  The format for your next appointment:   In Person  Provider:   Laurann Montana, NP or Coletta Memos, NP    Other Instructions Please monitor your blood pressure and keep a log 1x daily 1-2 hours after taking your medications and resting for 5-10 minutes. If your blood pressure is consistently higher than 130/80 please call our office at [phone]      Fluid Restriction Fluid restriction means that a person needs to limit the amount of fluid he or she drinks each day due to certain health conditions. The amount of fluid that a person is allowed each day (fluid allowance) may depend on several things, such as: Kidney function. How much fluid the body is holding on to (retaining). Blood pressure. Heart function. Blood sodium level. It is important to carefully measure and keep track of the amount of fluid that is consumed each day. What is my plan? Your health care provider recommends that you limit your fluid intake to __________ per day. What counts toward my fluid intake? Your fluid intake includes all liquids that you drink and any foods that become liquid at room temperature. Examples of some fluids that you will have to limit  include: Tea, coffee, soda, lemonade, milk, water, juice, sports drinks, and nutritional supplement beverages. Alcoholic beverages. Cream. Gravy. Ice cubes. Soup and broth. The following are examples of foods that become liquid at room temperature. These foods will also count toward your fluid intake. Ice cream and ice milk. Frozen yogurt and sherbet. Frozen ice pops. Flavored gelatin. How do I keep track of my fluid intake? Each morning, fill a jug with the amount of water that is equal to your daily fluid allowance. You can use this water as a guideline for fluid allowance. Each time you take in any form of fluid (including ice cubes and foods that become liquid at room temperature), pour an equal amount of water out of the container. This helps you to see how much fluid you are consuming and how much more fluid you can take in during the rest of the day. The following conversions may also be helpful in measuring your fluid intake: 1 cup equals 8 oz (240 mL).  cup equals 6 oz (180 mL). ? cup equals 5? oz (160 mL).  cup equals 4 oz (120 mL). ? cup equals 2? oz (80 mL).  cup equals 2 oz (60 mL). 2 Tbsp equals 1 oz (30 mL). What are  tips for following this plan? General instructions Make sure that you stay within your recommended fluid allowance each day. Always measure and keep track of your fluids, including ice cubes and foods that become liquid at room temperature. Use small cups and glasses and learn to sip fluids slowly. Try eating frozen fruits between meals, such as grapes or strawberries. These can satisfy thirst without adding to your fluid intake. Swallow your pills with meals or soft foods, such as applesauce or mashed potatoes, instead of with liquids. Doing this helps you save your fluid allowance for something that you enjoy. Weigh yourself each day   Weigh yourself every day. Keeping track of your daily weight can help you and your health care provider notice as soon  as possible if your body is retaining fluid. Follow this sequence every morning: Urinate. Weigh yourself. Eat breakfast. Wear the same amount of clothing each time you weigh yourself. Write down your daily weight. Give this weight record to your health care provider. If your weight is going up, you may be retaining too much fluid. Every 1 lb (0.45 kg) of body weight that you gain is a sign that your body is retaining 2 cups (480 mL) of fluid.  Manage your thirst Add lemon juice or a slice of fresh lemon to water or ice. Doing this helps to satisfy your thirst. Freeze fruit juice or water in an ice cube tray. Use this as part of your fluid allowance. These cubes are useful for quenching your thirst. Before you freeze the juice or water, measure how much liquid you use to fill a cube section of the ice tray. Subtract this amount from your day's allowance each time you consume a frozen cube. Avoid salty, or high sodium, foods. These foods make you thirsty and make it more difficult to stay within your daily fluid allowance. Keep the temperature in your home at a cooler level. Keep the air in your home as humid as possible. Dry air increases thirst. Avoid being out in the hot sun. This can cause you to sweat and become thirsty. To help avoid dry mouth, brush your teeth often or rinse out your mouth with mouthwash. Lemon wedges, hard sour candies, chewing gum, or breath spray may also help to moisten your mouth. What are some signs that I may be taking in too much fluid? You may be taking in too much fluid if: Your weight increases. Contact your health care provider if you gain weight rapidly. Your face, hands, legs, feet, and abdomen start to swell. You have trouble breathing. Summary Fluid restriction means that a person needs to limit the amount of fluid he or she drinks each day due to certain health conditions. The amount of fluid that you are allowed each day may depend on kidney function and  other factors. It is important to carefully measure and keep track of the amount of fluid that you consume each day. Your fluid intake includes all liquids that you drink, as well as any foods that become liquid at room temperature, such as ice cream, ice cubes, and gelatin. You may be taking in too much fluid if your weight increases, your body starts to swell, or you have trouble breathing. This information is not intended to replace advice given to you by your health care provider. Make sure you discuss any questions you have with your health care provider. Document Revised: 05/20/2020 Document Reviewed: 05/20/2020 Elsevier Patient Education  2022 [employer].

## 2021-09-01 NOTE — Telephone Encounter (Signed)
Called pharmacy and patient  to update for correct dosing

## 2021-09-03 DIAGNOSIS — R072 Precordial pain: Secondary | ICD-10-CM | POA: Diagnosis not present

## 2021-09-04 LAB — LIPID PANEL
Chol/HDL Ratio: 4 ratio (ref 0.0–4.4)
Cholesterol, Total: 215 mg/dL — ABNORMAL HIGH (ref 100–199)
HDL: 54 mg/dL (ref >39)
LDL Chol Calc (NIH): 140 mg/dL — ABNORMAL HIGH (ref 0–99)
Triglycerides: 118 mg/dL (ref 0–149)
VLDL Cholesterol Cal: 21 mg/dL (ref 5–40)

## 2021-09-04 LAB — HEPATIC FUNCTION PANEL
ALT: 16 IU/L (ref 0–32)
AST: 15 IU/L (ref 0–40)
Albumin: 4.7 g/dL (ref 3.8–4.8)
Alkaline Phosphatase: 102 IU/L (ref 44–121)
Bilirubin Total: 0.4 mg/dL (ref 0.0–1.2)
Bilirubin, Direct: 0.12 mg/dL (ref 0.00–0.40)
Total Protein: 7.1 g/dL (ref 6.0–8.5)

## 2021-09-07 ENCOUNTER — Ambulatory Visit (INDEPENDENT_AMBULATORY_CARE_PROVIDER_SITE_OTHER): Payer: 59

## 2021-09-07 ENCOUNTER — Other Ambulatory Visit: Payer: Self-pay

## 2021-09-07 DIAGNOSIS — R0602 Shortness of breath: Secondary | ICD-10-CM | POA: Diagnosis not present

## 2021-09-07 LAB — ECHOCARDIOGRAM COMPLETE
AR max vel: 2.84 cm2
AV Area VTI: 3.01 cm2
AV Area mean vel: 2.86 cm2
AV Mean grad: 5 mmHg
AV Peak grad: 9.6 mmHg
AV Vena cont: 0.12 cm
Ao pk vel: 1.55 m/s
Area-P 1/2: 3.79 cm2
Calc EF: 64.2 %
P 1/2 time: 585 msec
S' Lateral: 2.99 cm
Single Plane A2C EF: 60.6 %
Single Plane A4C EF: 64.8 %

## 2021-09-08 ENCOUNTER — Telehealth (HOSPITAL_BASED_OUTPATIENT_CLINIC_OR_DEPARTMENT_OTHER): Payer: Self-pay

## 2021-09-08 NOTE — Telephone Encounter (Addendum)
Results called to patient who verbalized understanding and is happy with her result!     ----- Message from Elgie Collard, PA-C sent at 09/08/2021  8:03 AM EST ----- Please let the patient know:  Your heart pump function is normal, you have slight impaired relaxation of the heart. Your left upper chamber of the heart is moderately enlarged. You have mild leaking of the mitral valve. You have mild leaking of the aortic valve with mild calcification. Your other valves are normal. If you have any questions please let me know!    lipid panel came back with elevated cholesterol of 215 and elevated LDL (bad cholesterol) 140.  Goal is less than 100.  At this time instead of adding another medication we can emphasize lifestyle changes like increasing your exercise every day and trying to maintain a low-cholesterol diet.  We will wait for your cardiac CT to come back and we may need to start medication at that time if you do indeed have coronary disease.  If you have any questions please let me know.

## 2021-09-10 ENCOUNTER — Telehealth (HOSPITAL_COMMUNITY): Payer: Self-pay | Admitting: *Deleted

## 2021-09-10 NOTE — Telephone Encounter (Signed)
Patient returning call regarding upcoming cardiac imaging study; pt verbalizes understanding of appt date/time, parking situation and where to check in, pre-test NPO status and medications ordered, name and call back number provided for further questions should they arise  Gordy Clement RN Navigator Cardiac Imaging [facility] [phone] office [phone] cell  Patient has concerns about taking 100mg  metoprolol tartrate two hours prior to CT scan. Patient will take her BP and HR prior to taking and may call if concerns about the medication.  She is aware to arrive at 10:30am for her 11am test.

## 2021-09-10 NOTE — Telephone Encounter (Signed)
Attempted to call patient regarding upcoming cardiac CT appointment. °Left message on voicemail with name and callback number ° °Esaias Cleavenger RN Navigator Cardiac Imaging °[facility] °[phone] Office °[phone] Cell ° °

## 2021-09-11 ENCOUNTER — Ambulatory Visit (HOSPITAL_COMMUNITY)
Admission: RE | Admit: 2021-09-11 | Discharge: 2021-09-11 | Disposition: A | Discharge status: Home / Self Care | Payer: 59 | Source: Ambulatory Visit | Attending: Physician Assistant | Admitting: Physician Assistant

## 2021-09-11 ENCOUNTER — Other Ambulatory Visit: Payer: Self-pay

## 2021-09-11 ENCOUNTER — Other Ambulatory Visit (HOSPITAL_BASED_OUTPATIENT_CLINIC_OR_DEPARTMENT_OTHER): Payer: Self-pay | Admitting: Family

## 2021-09-11 DIAGNOSIS — R072 Precordial pain: Secondary | ICD-10-CM | POA: Diagnosis not present

## 2021-09-11 DIAGNOSIS — I2510 Atherosclerotic heart disease of native coronary artery without angina pectoris: Secondary | ICD-10-CM

## 2021-09-11 DIAGNOSIS — E785 Hyperlipidemia, unspecified: Secondary | ICD-10-CM

## 2021-09-11 MED ORDER — NITROGLYCERIN 0.4 MG SL SUBL
SUBLINGUAL_TABLET | SUBLINGUAL | Status: AC
  Filled 2021-09-11: qty 2

## 2021-09-11 MED ORDER — NITROGLYCERIN 0.4 MG SL SUBL
0.8000 mg | SUBLINGUAL_TABLET | Freq: Once | SUBLINGUAL | Status: AC
  Administered 2021-09-11: 0.8 mg via SUBLINGUAL

## 2021-09-11 MED ORDER — ROSUVASTATIN CALCIUM 10 MG PO TABS: 10.0000 mg | ORAL_TABLET | Freq: Every day | ORAL | 11 refills | Status: DC

## 2021-09-11 MED ORDER — IOHEXOL 350 MG/ML SOLN
100.0000 mL | Freq: Once | INTRAVENOUS | Status: AC | PRN
  Administered 2021-09-11: 100 mL via INTRAVENOUS

## 2021-09-11 NOTE — Progress Notes (Signed)
CT scan completed. Tolerated well. D/c home ambulatory, awake and alert. In no distress 

## 2021-10-05 ENCOUNTER — Telehealth (HOSPITAL_BASED_OUTPATIENT_CLINIC_OR_DEPARTMENT_OTHER): Payer: Self-pay | Admitting: Family

## 2021-10-05 NOTE — Telephone Encounter (Signed)
Called pt to confirm her appt 10/06/21 with Laurann Montana, NP. She stated she was told the plaque in her arteries was minimal on her CTA. She stated she is not on board with starting cholesterol medication right now, she wants to try the lifestyle changes first. She stated she is going to see a specialist at [facility] for Lymphedema (which she states she does not believe she has, but does not know what is wrong). She stated she would like to wait to be seen after this appointment, but also does not want to waste anyone's time since she does not want to take the cholesterol medication.  Explained to pt that it is ok if she would like to hold off until after seeing the specialist for possible Lymphedema, but that it would still be worth coming in to talk about her cholesterol levels and what lifestyle changes she could make or what options are available to help lower her current cholesterol. Explained that even with minimal plaque, it should be treated to prevent the build up getting worse and help decrease bad cholesterol levels.   Pt agreed and verbalized understanding. She rescheduled for 11/17/21 with Laurann Montana, NP.

## 2021-10-05 NOTE — Progress Notes (Unsigned)
Office Visit    Patient Name: Kathy Dawson Date of Encounter: 09/01/2021  PCP:  Celene Squibb, MD   [place]  Cardiologist:  Larae Grooms, MD  Advanced Practice Provider:  No care team member to display Electrophysiologist:  None    Chief Complaint    Kathy Dawson is a 66 y.o. female with a hx of lymphedema, anemia, GERD, atypical chest pain, shortness of breath, palpitations presents today for ED follow-up.  Past Medical History    Past Medical History:  Diagnosis Date   Anemia    Hx   Anemia, iron deficiency 06/09/2013   Arthritis    knees, hips, elbow   Complication of anesthesia 14 yrs. ago   woke up during the appendectomy   GERD (gastroesophageal reflux disease)    otc med prn   Knee pain    Past Surgical History:  Procedure Laterality Date   ABDOMINAL HYSTERECTOMY     ANTERIOR HIP REVISION Right 11/18/2014   Procedure: Right Total Hip Arthroplasty Femoral Revision-Anteior approach;  Surgeon: Marybelle Killings, MD;  Location: [place];  Service: Orthopedics;  Laterality: Right;   APPENDECTOMY     bilateral foot surgery     hammer toes   BLADDER SUSPENSION N/A 02/02/2016   Procedure: TRANSVAGINAL TAPE (TVT) Mid-Urethral Sling PROCEDURE;  Surgeon: Azucena Fallen, MD;  Location: [facility];  Service: Gynecology;  Laterality: N/A;   COLONOSCOPY  12/08/2011   CWC:[id number] hemorrhoids/Sigmoid diverticulosis. Colonic polyp (tubular adenoma). Surveillance 2018   COLONOSCOPY N/A 09/09/2017   Procedure: COLONOSCOPY;  Surgeon: Daneil Dolin, MD;  [facility];  Service: Endoscopy;  Laterality: N/A;  2:15pm   CYSTOSCOPY N/A 02/02/2016   Procedure: CYSTOSCOPY;  Surgeon: Azucena Fallen, MD;  Location: [facility];  Service: Gynecology;  Laterality: N/A;   ESOPHAGOGASTRODUODENOSCOPY N/A 09/16/2014   Procedure: ESOPHAGOGASTRODUODENOSCOPY (EGD);  Surgeon: Daneil Dolin, MD;  [facility];  Service: Endoscopy;  Laterality: N/A;  1030am   KNEE  SURGERY Right 2011   arthroscopy   MALONEY DILATION N/A 09/16/2014   Procedure: Venia Minks DILATION;  Surgeon: Daneil Dolin, MD;  [facility];  Service: Endoscopy;  Laterality: N/A;   REPLACEMENT TOTAL KNEE Right 01/19/2013   TOTAL HIP ARTHROPLASTY Right 11/04/2014   Procedure: TOTAL HIP ARTHROPLASTY ANTERIOR APPROACH, Possible Local Bonegrafting Acetabular Cyst;  Surgeon: Marybelle Killings, MD;  Location: [place];  Service: Orthopedics;  Laterality: Right;   TUBAL LIGATION     VAGINAL HYSTERECTOMY N/A 02/02/2016   Procedure: HYSTERECTOMY VAGINAL;  Surgeon: Azucena Fallen, MD;  Location: [facility];  Service: Gynecology;  Laterality: N/A;    Allergies  Allergies  Allergen Reactions   Clindamycin/Lincomycin Hives and Itching   Prochlorperazine Other (See Comments)    Seizure like symptoms    History of Present Illness    Kathy Dawson is a 66 y.o. female with a hx of lymphedema, anemia, GERD, atypical chest pain, shortness of breath, palpitations presents today for ED follow-up.  She was seen in the ER July 2021 for presyncope.  At this time she was experiencing a dizzy sensation characterized by spinning in her head and lightheadedness.  She still feeling funny all over.  She has been taking her usual medications.  She had COVID earlier in the year but had not had any of the vaccines.  She had had some intermittent shaking troponin was negative.  She had a CT scan at that time which ruled  out PE.  You are lightheaded and dizzy CT scan rule out PE. Echocardiogram a month later showed LVEF 60 to 65%, Grade I DD, no significant valve disease.  BP was intermittently high at this time.  She felt like she was unable to focus with her vision.  She had palpitations lasting up to 10 to 15 minutes to several hours.  She also reported some shortness of breath at this time.  She does have a family history of CAD in her father, grandparents on both sides, and uncle.  A 14-day Zio patch was ordered and she was  instructed to stay hydrated.  She was last seen by me and she has multiple complaints.  She was in the ED recently with chest pain which she has had since the previous Saturday.  She explained the pain as a chest heaviness.  It started in her jaw and teeth and then was in her chest.  She also had some vision changes where she felt like she could not focus.  She states that at this time her blood pressure was hypertensive with a systolic reading of 332.  In the ED troponins were negative.  CT scan to rule out PE was negative.  In addition to periods of hypertension she is also had periods of hypotension.  When she took her blood pressure last night it was 99 systolic and she was not feeling right.  She has noticed increased shortness of breath especially with activity and with talking.  This is progressed over the last couple of months.  She stated she does have a smoking history but has quit over 20 years ago.  She did endorse palpitations but they are not as severe as they have been in the past and they usually do not bother her.  Her biggest concern was the possibility of coronary disease because she has such a strong family history and risk factors.  Today, she ***  EKGs/Labs/Other Studies Reviewed:   The following studies were reviewed today:  Cardiac CT scan 09/11/2021  FINDINGS: Coronary calcium score: The patient's coronary artery calcium score is 14, which places the patient in the 66th percentile.   Coronary arteries: Normal coronary origins.  Right dominance.   Right Coronary Artery: Normal caliber vessel, gives rise to PDA. No significant plaque or stenosis.   Left Main Coronary Artery: Normal caliber vessel. No significant plaque or stenosis.   Left Anterior Descending Coronary Artery: Normal caliber vessel. Focal calcified plaque in the proximal LAD with 1-24% stenosis. Gives rise to 2 diagonal branches.   Left Circumflex Artery: Normal caliber vessel. No significant  plaque or stenosis. Gives rise to 2 OM branches.   Aorta: Normal size, 36 mm at the mid ascending aorta (level of the PA bifurcation) measured double oblique. No aortic atherosclerosis. No dissection seen in visualized portions of the aorta.   Aortic Valve: No calcifications. Trileaflet.   Other findings:   Normal pulmonary vein drainage into the left atrium.   Normal left atrial appendage without a thrombus.   Normal size of the pulmonary artery.   Normal appearance of the pericardium.   IMPRESSION: 1.  Minimal nonobstructive CAD, CADRADS = 1.   2. Coronary calcium score of 14. This was 66th percentile for age and sex matched control.   3. Normal coronary origin with right dominance.  Echocardiogram 09/07/21  IMPRESSIONS     1. Left ventricular ejection fraction by 3D volume is 66 %. The left  ventricle has normal function. The  left ventricle has no regional wall  motion abnormalities. There is mild left ventricular hypertrophy of the  anterior segment. Left ventricular  diastolic parameters are consistent with Grade I diastolic dysfunction  (impaired relaxation). The average left ventricular global longitudinal  strain is -17.9 %. The global longitudinal strain is normal.   2. Right ventricular systolic function is normal. The right ventricular  size is normal.   3. Left atrial size was moderately dilated.   4. The mitral valve is normal in structure. Mild mitral valve  regurgitation. No evidence of mitral stenosis.   5. The aortic valve is normal in structure. There is mild calcification  of the aortic valve. Aortic valve regurgitation is mild. No aortic  stenosis is present.   6. The inferior vena cava is normal in size with greater than 50%  respiratory variability, suggesting right atrial pressure of 3 mmHg.   08/30/2021  IMPRESSION: 1. No evidence of acute pulmonary embolism. 2. Lungs are clear. 3. Central pulmonary arteries are mildly dilated, which can be  seen in the setting of pulmonary arterial hypertension. 4. Slight reflux of contrast into the IVC and hepatic veins, which can be seen in the setting of right heart dysfunction. 5. Borderline enlarged precarinal lymph node, nonspecific and may be reactive.     Electronically Signed   By: Davina Poke D.O.   On: 08/30/2021 16:29    EKG:  EKG is not  ordered today.  Recent Labs: 08/30/2021: B Natriuretic Peptide 75.2; BUN 13; Creatinine, Ser 0.70; Hemoglobin 13.7; Platelets 263; Potassium 3.7; Sodium 141  Recent Lipid Panel    Component Value Date/Time   CHOL 165 10/31/2015 0946   TRIG 84 10/31/2015 0946   HDL 44 (L) 10/31/2015 0946   CHOLHDL 3.8 10/31/2015 0946   VLDL 17 10/31/2015 0946   LDLCALC 104 10/31/2015 0946     Home Medications   Current Meds  Medication Sig   acetaminophen (TYLENOL) 500 MG tablet Take 1,000 mg by mouth 2 (two) times daily.   Ascorbic Acid (VITAMIN C) 100 MG tablet Take 100 mg by mouth daily.   cholecalciferol (VITAMIN D3) 25 MCG (1000 UNIT) tablet Take 1,000 Units by mouth daily.   metoprolol tartrate (LOPRESSOR) 100 MG tablet Take 1 tablet (100 mg total) by mouth 2 (two) times daily.   vitamin B-12 (CYANOCOBALAMIN) 100 MCG tablet Take 100 mcg by mouth daily.     Review of Systems      All other systems reviewed and are otherwise negative except as noted above.  Physical Exam    VS:  BP 130/84    Pulse 88    Ht 5\' 3"  (1.6 m)    Wt 213 lb 12.8 oz (97 kg)    SpO2 97%    BMI 37.87 kg/m  , BMI Body mass index is 37.87 kg/m.  Wt Readings from Last 3 Encounters:  09/01/21 213 lb 12.8 oz (97 kg)  02/20/21 215 lb (97.5 kg)  11/01/20 205 lb (93 kg)     GEN: Well nourished, well developed, in no acute distress. HEENT: normal. Neck: Supple, no JVD, carotid bruits, or masses. Cardiac: RRR, no murmurs, rubs, or gallops. No clubbing, cyanosis, edema.  Radials/PT 2+ and equal bilaterally.  Respiratory:  Respirations regular and unlabored,  clear to auscultation bilaterally. GI: Soft, nontender, nondistended. MS: No deformity or atrophy. Skin: Warm and dry, no rash. Neuro:  Strength and sensation are intact. Psych: Normal affect.  Assessment & Plan    Lymphadema -  She states this is better than it has been. She wears a sleeve on her left arm which works well for her and well as compression socks  2. SOB -Most associated with activity and talking -Echocardiogram 09/07/21 showed normal EF, grade I DD, left atrial enlargement, mild MR, mild AI, and mild aortic valve calcification -Recent lab work done in the ED reviewed without any concerns  3. Chest pain -nonspecific and not necessary associated with exertion, she did have associated jaw and tooth pain on the right side -CT scan showed minimum non-obstructive CAD (09/11/21)  4. Hypertension -Extremely variable with SBP 188-99 mmHg -She feels especially bad when it is really high over the low -Currently not on any antihypertensives -I have asked the patient to record her blood pressure at the same time of day for the next few weeks and record these values.  She is to bring the recorded values in at her next appointment.  5. GERD -she watches her diet and has not had indigestion is over a week  -the chest pain is a different feeling from the indigestion she has felt in the past  6. Hx of Hyperlipidemia -no recent values -recent lipid panel shows ***    Disposition: Follow up 1 month with Larae Grooms, MD or APP.  Signed, Elgie Collard, PA-C 09/01/2021, 12:25 PM [facility]

## 2021-10-06 ENCOUNTER — Ambulatory Visit (HOSPITAL_BASED_OUTPATIENT_CLINIC_OR_DEPARTMENT_OTHER): Payer: 59 | Admitting: Family

## 2021-10-06 DIAGNOSIS — M1712 Unilateral primary osteoarthritis, left knee: Secondary | ICD-10-CM | POA: Diagnosis not present

## 2021-11-06 DIAGNOSIS — R600 Localized edema: Secondary | ICD-10-CM | POA: Diagnosis not present

## 2021-11-06 DIAGNOSIS — R601 Generalized edema: Secondary | ICD-10-CM | POA: Diagnosis not present

## 2021-11-06 DIAGNOSIS — M2550 Pain in unspecified joint: Secondary | ICD-10-CM | POA: Diagnosis not present

## 2021-11-13 DIAGNOSIS — R601 Generalized edema: Secondary | ICD-10-CM | POA: Diagnosis not present

## 2021-11-13 DIAGNOSIS — M2550 Pain in unspecified joint: Secondary | ICD-10-CM | POA: Diagnosis not present

## 2021-11-13 DIAGNOSIS — I890 Lymphedema, not elsewhere classified: Secondary | ICD-10-CM | POA: Diagnosis not present

## 2021-11-13 DIAGNOSIS — R600 Localized edema: Secondary | ICD-10-CM | POA: Diagnosis not present

## 2021-11-17 ENCOUNTER — Ambulatory Visit (HOSPITAL_BASED_OUTPATIENT_CLINIC_OR_DEPARTMENT_OTHER): Payer: 59 | Admitting: Family

## 2021-12-08 DIAGNOSIS — I890 Lymphedema, not elsewhere classified: Secondary | ICD-10-CM | POA: Diagnosis not present

## 2021-12-08 DIAGNOSIS — E785 Hyperlipidemia, unspecified: Secondary | ICD-10-CM | POA: Diagnosis not present

## 2022-02-20 ENCOUNTER — Ambulatory Visit
Admission: EM | Admit: 2022-02-20 | Discharge: 2022-02-20 | Disposition: A | Discharge status: Home / Self Care | Payer: 59

## 2022-02-20 ENCOUNTER — Encounter: Payer: Self-pay | Admitting: Emergency Medicine

## 2022-02-20 DIAGNOSIS — R059 Cough, unspecified: Secondary | ICD-10-CM | POA: Diagnosis not present

## 2022-02-20 DIAGNOSIS — J309 Allergic rhinitis, unspecified: Secondary | ICD-10-CM | POA: Diagnosis not present

## 2022-02-20 MED ORDER — CETIRIZINE HCL 10 MG PO TABS: 10.0000 mg | ORAL_TABLET | Freq: Every day | ORAL | 0 refills | Status: DC

## 2022-02-20 MED ORDER — FLUTICASONE PROPIONATE 50 MCG/ACT NA SUSP
2.0000 | Freq: Every day | NASAL | 0 refills | Status: DC
Start: 2022-02-20 — End: 2022-10-04

## 2022-02-20 MED ORDER — PSEUDOEPH-BROMPHEN-DM 30-2-10 MG/5ML PO SYRP: 5.0000 mL | ORAL_SOLUTION | Freq: Four times a day (QID) | ORAL | 0 refills | Status: DC | PRN

## 2022-02-20 NOTE — ED Triage Notes (Signed)
Cough, ear discomfort x 1 week.  States throat is irritated.  States she had a headache x 2 weeks before these symptoms started.  States she is having mid back pain.

## 2022-02-20 NOTE — Discharge Instructions (Signed)
Take medication as prescribed. Increase fluids and allow for plenty of rest. Recommend Tylenol or ibuprofen as needed for pain, fever, or general discomfort. Warm salt water gargles 3-4 times daily to help with throat pain or discomfort. Recommend using a humidifier at bedtime during sleep to help with cough and nasal congestion. Sleep elevated on 2 pillows while symptoms persist. Follow-up in this clinic or with PCP if your symptoms do not improve within the next 3 to 5 days.

## 2022-02-20 NOTE — ED Provider Notes (Signed)
[facility]    CSN: [id number] Arrival date & time: 02/20/22  0912      History   Chief Complaint No chief complaint on file.   HPI Kathy Dawson is a 66 y.o. female.   HPI  Patient presents for complaints of cough, right ear discomfort, throat irritation that has been present for the past week.  She states she has had a headache 2 weeks prior to her symptoms started.  She also complains of mid back pain, but thinks this may be due to coughing.  States that the cough is nonproductive, and has been keeping her awake at night.  States that she has had to sleep in a recliner or on the couch.  She denies fever, chills, wheezing, difficulty breathing, or GI symptoms.  She states that she took Dimetapp last evening for the cough.  Denies any known sick contacts.  States that her sputum has changed since she had COVID.  Past Medical History:  Diagnosis Date   Anemia    Hx   Anemia, iron deficiency 06/09/2013   Arthritis    knees, hips, elbow   Complication of anesthesia 14 yrs. 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   GERD (gastroesophageal reflux disease) 08/29/2014   Tubular adenoma of colon 08/29/2014   Dysphagia, pharyngoesophageal phase 08/29/2014   Anemia, iron deficiency 06/09/2013   Difficulty in walking(719.7) 02/05/2013   Stiffness of right knee 02/05/2013   Bone infection, knee ([place]) 12/19/2012   Microcytic anemia 10/09/2012   Pain due to total knee replacement ([place]) 01/31/2012   Rectal bleeding 11/27/2011   Constipation 11/27/2011   Edema of leg 11/19/2011   Osteoarthritis of right knee 11/19/2011   DERANGEMENT MENISCUS  08/26/2008   KNEE, ARTHRITIS, DEGEN./OSTEO 07/25/2008   KNEE PAIN 07/25/2008    Past Surgical History:  Procedure Laterality Date   ABDOMINAL HYSTERECTOMY     ANTERIOR HIP REVISION Right 11/18/2014   Procedure: Right Total Hip Arthroplasty Femoral Revision-Anteior approach;  Surgeon: Marybelle Killings, MD;  Location: [place];  Service: Orthopedics;  Laterality: Right;   APPENDECTOMY     bilateral foot surgery     hammer toes   BLADDER SUSPENSION N/A 02/02/2016   Procedure: TRANSVAGINAL TAPE (TVT) Mid-Urethral Sling PROCEDURE;  Surgeon: Azucena Fallen, MD;  Location: [facility];  Service: Gynecology;  Laterality: N/A;   COLONOSCOPY  12/08/2011   AOZ:HYQMVHQI hemorrhoids/Sigmoid diverticulosis. Colonic polyp (tubular adenoma). Surveillance 2018   COLONOSCOPY N/A 09/09/2017   Procedure: COLONOSCOPY;  Surgeon: Daneil Dolin, MD;  [facility];  Service: Endoscopy;  Laterality: N/A;  2:15pm   CYSTOSCOPY N/A 02/02/2016   Procedure: CYSTOSCOPY;  Surgeon: Azucena Fallen, MD;  Location: [facility];  Service: Gynecology;  Laterality: N/A;   ESOPHAGOGASTRODUODENOSCOPY N/A 09/16/2014   Procedure: ESOPHAGOGASTRODUODENOSCOPY (EGD);  Surgeon: Daneil Dolin, MD;  [facility];  Service: Endoscopy;  Laterality: N/A;  1030am   KNEE SURGERY Right 2011   arthroscopy   MALONEY DILATION N/A 09/16/2014   Procedure: Venia Minks DILATION;  Surgeon: Daneil Dolin, MD;  [facility];  Service: Endoscopy;  Laterality: N/A;  REPLACEMENT TOTAL KNEE Right 01/19/2013   TOTAL HIP ARTHROPLASTY Right 11/04/2014   Procedure: TOTAL HIP ARTHROPLASTY ANTERIOR APPROACH, Possible Local Bonegrafting Acetabular Cyst;  Surgeon: Marybelle Killings, MD;  Location: [place];  Service: Orthopedics;  Laterality: Right;   TUBAL LIGATION     VAGINAL HYSTERECTOMY N/A 02/02/2016   Procedure: HYSTERECTOMY VAGINAL;  Surgeon: Azucena Fallen, MD;  Location: [facility];  Service: Gynecology;  Laterality: N/A;    OB History     Gravida  4   Para   3   Term  3   Preterm      AB  1   Living         SAB  1   IAB      Ectopic      Multiple      Live Births               Home Medications    Prior to Admission medications   Medication Sig Start Date End Date Taking? Authorizing Provider  brompheniramine-pseudoephedrine-DM 30-2-10 MG/5ML syrup Take 5 mLs by mouth 4 (four) times daily as needed. 02/20/22  Yes Tiphani Mells-Warren, Alda Lea, NP  cetirizine (ZYRTEC) 10 MG tablet Take 1 tablet (10 mg total) by mouth daily. 02/20/22  Yes Esma Kilts-Warren, Alda Lea, NP  fluticasone (FLONASE) 50 MCG/ACT nasal spray Place 2 sprays into both nostrils daily. 02/20/22  Yes Anand Tejada-Warren, Alda Lea, NP  pantoprazole (PROTONIX) 20 MG tablet Take 20 mg by mouth daily.   Yes [provider]  acetaminophen (TYLENOL) 500 MG tablet Take 1,000 mg by mouth 2 (two) times daily.    [provider]  albuterol (VENTOLIN HFA) 108 (90 Base) MCG/ACT inhaler Inhale 2 puffs into the lungs every 6 (six) hours as needed for wheezing or shortness of breath. Patient not taking: Reported on 09/01/2021 07/27/21   Volney American, PA-C  amoxicillin-clavulanate (AUGMENTIN) 875-125 MG tablet Take 1 tablet by mouth every 12 (twelve) hours. Patient not taking: Reported on 09/01/2021 07/27/21   Volney American, PA-C  Ascorbic Acid (VITAMIN C) 100 MG tablet Take 100 mg by mouth daily.    [provider]  calcium carbonate (TUMS - DOSED IN MG ELEMENTAL CALCIUM) 500 MG chewable tablet Chew 1 tablet by mouth daily as needed for indigestion or heartburn. Patient not taking: Reported on 09/01/2021    [provider]  chlorpheniramine-HYDROcodone (TUSSIONEX PENNKINETIC ER) 10-8 MG/5ML SUER Take 5 mLs by mouth every 12 (twelve) hours as needed for cough. Patient not taking: Reported on 09/01/2021 07/27/21   Volney American, PA-C  cholecalciferol (VITAMIN D3) 25 MCG (1000 UNIT) tablet Take 1,000 Units by mouth daily.    [provider]  meclizine (ANTIVERT) 25 MG tablet Take 1 tablet (25 mg total) by mouth 3 (three) times daily as needed for dizziness. Patient not taking: Reported on 09/01/2021 11/02/20   Veryl Speak, MD  metoprolol tartrate (LOPRESSOR) 100 MG tablet Take 1 tablet (100 mg total) by mouth once for 1 dose. 09/01/21 09/01/21  Elgie Collard, PA-C  rosuvastatin (CRESTOR) 10 MG tablet Take 1 tablet (10 mg total) by mouth daily. 09/11/21 09/11/22  Elgie Collard, PA-C  vitamin B-12 (CYANOCOBALAMIN) 100 MCG tablet Take 100 mcg by mouth daily.    [provider]    Family History Family History  Problem Relation Age of Onset   Colon polyps Father    Cancer Father    Heart disease Father    Heart disease Other  Colon cancer Neg Hx     Social History Social History   Tobacco Use   Smoking status: Former    Packs/day: 1.00    Years: 20.00    Total pack years: 20.00    Types: Cigarettes    Start date: 40    Quit date: 1995    Years since quitting: 28.5   Smokeless tobacco: Never  Vaping Use   Vaping Use: Never used  Substance Use Topics   Alcohol use: No    Alcohol/week: 0.0 standard drinks of alcohol   Drug use: No     Allergies   Clindamycin/lincomycin and Prochlorperazine   Review of Systems Review of Systems Per HPI  Physical Exam Triage Vital Signs ED Triage Vitals  Enc Vitals Group     BP 02/20/22 0951 121/80     Pulse Rate 02/20/22 0951 77     Resp 02/20/22 0951 18     Temp 02/20/22 0951 98.8 F (37.1 C)     Temp Source 02/20/22 0951 Oral     SpO2 02/20/22 0951 96 %     Weight --      Height --      Head Circumference --      Peak Flow --      Pain Score 02/20/22 0952 5     Pain Loc --      Pain Edu? --      Excl. in [place]? --    No data found.  Updated Vital Signs BP 121/80 (BP Location: Right Arm)   Pulse 77   Temp 98.8 F (37.1 C) (Oral)   Resp 18   SpO2 96%   Visual Acuity Right Eye Distance:   Left Eye Distance:   Bilateral  Distance:    Right Eye Near:   Left Eye Near:    Bilateral Near:     Physical Exam Vitals and nursing note reviewed.  Constitutional:      Appearance: Normal appearance. She is well-developed.  HENT:     Head: Normocephalic and atraumatic.     Right Ear: Tympanic membrane, ear canal and external ear normal.     Left Ear: Tympanic membrane, ear canal and external ear normal.     Nose: Congestion present.     Right Turbinates: Enlarged and swollen.     Left Turbinates: Enlarged and swollen.     Right Sinus: No maxillary sinus tenderness or frontal sinus tenderness.     Left Sinus: No maxillary sinus tenderness or frontal sinus tenderness.     Mouth/Throat:     Lips: Pink.     Mouth: Mucous membranes are moist.     Pharynx: Posterior oropharyngeal erythema present. No oropharyngeal exudate.     Tonsils: No tonsillar exudate. 1+ on the right. 1+ on the left.  Eyes:     Conjunctiva/sclera: Conjunctivae normal.     Pupils: Pupils are equal, round, and reactive to light.  Neck:     Thyroid: No thyromegaly.     Trachea: No tracheal deviation.  Cardiovascular:     Rate and Rhythm: Normal rate and regular rhythm.     Heart sounds: Normal heart sounds.  Pulmonary:     Effort: Pulmonary effort is normal.     Breath sounds: Normal breath sounds.  Abdominal:     General: Bowel sounds are normal. There is no distension.     Palpations: Abdomen is soft.     Tenderness: There is no abdominal tenderness.  Musculoskeletal:  Cervical back: Normal range of motion and neck supple.  Lymphadenopathy:     Cervical: No cervical adenopathy.  Skin:    General: Skin is warm and dry.  Neurological:     General: No focal deficit present.     Mental Status: She is alert and oriented to person, place, and time.  Psychiatric:        Behavior: Behavior normal.        Thought Content: Thought content normal.        Judgment: Judgment normal.      UC Treatments / Results  Labs (all labs  ordered are listed, but only abnormal results are displayed) Labs Reviewed - No data to display  EKG   Radiology No results found.  Procedures Procedures (including critical care time)  Medications Ordered in UC Medications - No data to display  Initial Impression / Assessment and Plan / UC Course  I have reviewed the triage vital signs and the nursing notes.  Pertinent labs & imaging results that were available during my care of the patient were reviewed by me and considered in my medical decision making (see chart for details).  Patient presents with cough and throat irritation for the past week.  On exam, her vital signs are stable, exam is benign.  Differential diagnoses include acute pharyngitis versus allergic rhinitis versus upper respiratory infection.  We will treat patient symptomatically at this time with Bromfed, cetirizine, and fluticasone.  Supportive care recommendations were provided to the patient.  Patient advised to follow-up if symptoms do not improve within the next 3 to 5 days. Final Clinical Impressions(s) / UC Diagnoses   Final diagnoses:  Allergic rhinitis, unspecified seasonality, unspecified trigger  Cough, unspecified type     Discharge Instructions      Take medication as prescribed. Increase fluids and allow for plenty of rest. Recommend Tylenol or ibuprofen as needed for pain, fever, or general discomfort. Warm salt water gargles 3-4 times daily to help with throat pain or discomfort. Recommend using a humidifier at bedtime during sleep to help with cough and nasal congestion. Sleep elevated on 2 pillows while symptoms persist. Follow-up in this clinic or with PCP if your symptoms do not improve within the next 3 to 5 days.      ED Prescriptions     Medication Sig Dispense Auth. Provider   brompheniramine-pseudoephedrine-DM 30-2-10 MG/5ML syrup Take 5 mLs by mouth 4 (four) times daily as needed. 140 mL Kyrstyn Greear-Warren, Alda Lea, NP    cetirizine (ZYRTEC) 10 MG tablet Take 1 tablet (10 mg total) by mouth daily. 30 tablet Toyna Erisman-Warren, Alda Lea, NP   fluticasone (FLONASE) 50 MCG/ACT nasal spray Place 2 sprays into both nostrils daily. 16 g Mackena Plummer-Warren, Alda Lea, NP      PDMP not reviewed this encounter.   Tish Men, NP 02/20/22 1025

## 2022-03-01 ENCOUNTER — Ambulatory Visit
Admission: EM | Admit: 2022-03-01 | Discharge: 2022-03-01 | Disposition: A | Discharge status: Home / Self Care | Payer: 59 | Attending: Nurse Practitioner | Admitting: Nurse Practitioner

## 2022-03-01 DIAGNOSIS — B9689 Other specified bacterial agents as the cause of diseases classified elsewhere: Secondary | ICD-10-CM

## 2022-03-01 DIAGNOSIS — J019 Acute sinusitis, unspecified: Secondary | ICD-10-CM | POA: Diagnosis not present

## 2022-03-01 DIAGNOSIS — R059 Cough, unspecified: Secondary | ICD-10-CM | POA: Diagnosis not present

## 2022-03-01 MED ORDER — HYDROCODONE BIT-HOMATROP MBR 5-1.5 MG/5ML PO SOLN
5.0000 mL | Freq: Four times a day (QID) | ORAL | 0 refills | Status: DC | PRN
Start: 2022-03-01 — End: 2022-10-04

## 2022-03-01 MED ORDER — AMOXICILLIN-POT CLAVULANATE 875-125 MG PO TABS: 1.0000 | ORAL_TABLET | Freq: Two times a day (BID) | ORAL | 0 refills | Status: DC

## 2022-03-01 MED ORDER — PREDNISONE 20 MG PO TABS: 40.0000 mg | ORAL_TABLET | Freq: Every day | ORAL | 0 refills | Status: AC

## 2022-03-01 NOTE — Discharge Instructions (Addendum)
Take medication as prescribed. Increase fluids and allow for plenty of rest. Recommend Tylenol or ibuprofen as needed for pain, fever, or general discomfort. Recommend using a humidifier at bedtime during sleep to help with cough and nasal congestion. Sleep elevated on 2 pillows while cough symptoms persist. Follow-up in this clinic or with your PCP if your symptoms do not improve.

## 2022-03-01 NOTE — ED Triage Notes (Signed)
Pt reports cough, body aches, head and chest congestion x 3 weeks.

## 2022-03-01 NOTE — ED Provider Notes (Signed)
[facility]    CSN: [id number] Arrival date & time: 03/01/22  0847      History   Chief Complaint Chief Complaint  Patient presents with   Cough   Nasal Congestion         HPI Kathy Dawson is a 66 y.o. female.    Cough   Patient presents for complaints of cough, body aches, head congestion, chest congestion.  Patient's symptoms have been present for approximately 3 weeks.  She was seen on 02/20/2022 and prescribed cetirizine, fluticasone, and Bromfed.  She was also diagnosed with allergic rhinitis.  Patient states since that time, her cough has worsened.  She states the cough has become productive of yellow sputum.  Cough is also worse at night, states she has not slept for the past 3 days.  She also states that her head is hurting because of the cough.  She denies new onset fever, chills, ear pain, wheezing, shortness of breath, difficulty breathing, or GI symptoms.  Past Medical History:  Diagnosis Date   Anemia    Hx   Anemia, iron deficiency 06/09/2013   Arthritis    knees, hips, elbow   Complication of anesthesia 14 yrs. 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   GERD (gastroesophageal reflux disease) 08/29/2014   Tubular adenoma of colon 08/29/2014   Dysphagia, pharyngoesophageal phase 08/29/2014   Anemia, iron deficiency 06/09/2013   Difficulty in walking(719.7) 02/05/2013   Stiffness of right knee 02/05/2013   Bone infection, knee ([place]) 12/19/2012   Microcytic anemia 10/09/2012   Pain due to total knee replacement ([place]) 01/31/2012   Rectal bleeding 11/27/2011   Constipation 11/27/2011   Edema of leg 11/19/2011    Osteoarthritis of right knee 11/19/2011   DERANGEMENT MENISCUS 08/26/2008   KNEE, ARTHRITIS, DEGEN./OSTEO 07/25/2008   KNEE PAIN 07/25/2008    Past Surgical History:  Procedure Laterality Date   ABDOMINAL HYSTERECTOMY     ANTERIOR HIP REVISION Right 11/18/2014   Procedure: Right Total Hip Arthroplasty Femoral Revision-Anteior approach;  Surgeon: Marybelle Killings, MD;  Location: [facility];  Service: Orthopedics;  Laterality: Right;   APPENDECTOMY     bilateral foot surgery     hammer toes   BLADDER SUSPENSION N/A 02/02/2016   Procedure: TRANSVAGINAL TAPE (TVT) Mid-Urethral Sling PROCEDURE;  Surgeon: Azucena Fallen, MD;  Location: [facility];  Service: Gynecology;  Laterality: N/A;   COLONOSCOPY  12/08/2011   [id number] hemorrhoids/Sigmoid diverticulosis. Colonic polyp (tubular adenoma). Surveillance 2018   COLONOSCOPY N/A 09/09/2017   Procedure: COLONOSCOPY;  Surgeon: Daneil Dolin, MD;  [facility];  Service: Endoscopy;  Laterality: N/A;  2:15pm   CYSTOSCOPY N/A 02/02/2016   Procedure: CYSTOSCOPY;  Surgeon: Azucena Fallen, MD;  Location: [facility];  Service: Gynecology;  Laterality: N/A;   ESOPHAGOGASTRODUODENOSCOPY N/A 09/16/2014   Procedure: ESOPHAGOGASTRODUODENOSCOPY (EGD);  Surgeon: Daneil Dolin, MD;  [facility];  Service: Endoscopy;  Laterality: N/A;  1030am   KNEE SURGERY Right 2011   arthroscopy   MALONEY DILATION N/A 09/16/2014   Procedure: Venia Minks DILATION;  Surgeon: Daneil Dolin, MD;  [facility];  Service: Endoscopy;  Laterality: N/A;   REPLACEMENT TOTAL KNEE Right 01/19/2013   TOTAL HIP ARTHROPLASTY Right 11/04/2014   Procedure: TOTAL HIP ARTHROPLASTY ANTERIOR APPROACH, Possible Local Bonegrafting Acetabular Cyst;  Surgeon: Marybelle Killings, MD;  Location: [facility];  Service: Orthopedics;  Laterality: Right;   TUBAL LIGATION     VAGINAL HYSTERECTOMY N/A 02/02/2016   Procedure: HYSTERECTOMY VAGINAL;  Surgeon: Azucena Fallen, MD;  Location: [facility];  Service:  Gynecology;  Laterality: N/A;    OB History     Gravida  4   Para  3   Term  3   Preterm      AB  1   Living         SAB  1   IAB      Ectopic      Multiple      Live Births               Home Medications    Prior to Admission medications   Medication Sig Start Date End Date Taking? Authorizing Provider  amoxicillin-clavulanate (AUGMENTIN) 875-125 MG tablet Take 1 tablet by mouth every 12 (twelve) hours. 03/01/22  Yes Nazifa Trinka-Warren, Alda Lea, NP  HYDROcodone bit-homatropine (HYCODAN) 5-1.5 MG/5ML syrup Take 5 mLs by mouth every 6 (six) hours as needed for cough. 03/01/22  Yes Sulo Janczak-Warren, Alda Lea, NP  predniSONE (DELTASONE) 20 MG tablet Take 2 tablets (40 mg total) by mouth daily with breakfast for 5 days. 03/01/22 03/06/22 Yes Shamya Macfadden-Warren, Alda Lea, NP  acetaminophen (TYLENOL) 500 MG tablet Take 1,000 mg by mouth 2 (two) times daily.    [provider]  albuterol (VENTOLIN HFA) 108 (90 Base) MCG/ACT inhaler Inhale 2 puffs into the lungs every 6 (six) hours as needed for wheezing or shortness of breath. Patient not taking: Reported on 09/01/2021 07/27/21   Volney American, PA-C  Ascorbic Acid (VITAMIN C) 100 MG tablet Take 100 mg by mouth daily.    [provider]  brompheniramine-pseudoephedrine-DM 30-2-10 MG/5ML syrup Take 5 mLs by mouth 4 (four) times daily as needed. 02/20/22   Denis Koppel-Warren, Alda Lea, NP  calcium carbonate (TUMS - DOSED IN MG ELEMENTAL CALCIUM) 500 MG chewable tablet Chew 1 tablet by mouth daily as needed for indigestion or heartburn. Patient not taking: Reported on 09/01/2021    [provider]  cetirizine (ZYRTEC) 10 MG tablet Take 1 tablet (10 mg total) by mouth daily. 02/20/22   Abbegayle Denault-Warren, Alda Lea, NP  chlorpheniramine-HYDROcodone (TUSSIONEX PENNKINETIC ER) 10-8 MG/5ML SUER Take 5 mLs by mouth every 12 (twelve) hours as needed for cough. Patient not taking: Reported on 09/01/2021 07/27/21   Volney American, PA-C  cholecalciferol (VITAMIN D3) 25 MCG (1000 UNIT) tablet Take 1,000 Units by mouth daily.    [provider]  fluticasone (FLONASE) 50 MCG/ACT nasal spray Place 2 sprays into both nostrils daily. 02/20/22   Chanae Gemma-Warren, Alda Lea, NP  meclizine (ANTIVERT) 25 MG tablet Take 1 tablet (25 mg total) by mouth 3 (three) times daily as needed for dizziness. Patient not taking: Reported on 09/01/2021 11/02/20   Veryl Speak, MD  metoprolol tartrate (LOPRESSOR) 100 MG tablet Take 1 tablet (100 mg total) by mouth once for 1 dose. 09/01/21 09/01/21  Elgie Collard, PA-C  pantoprazole (PROTONIX) 20 MG tablet Take 20 mg by mouth daily.    [provider]  rosuvastatin (CRESTOR) 10 MG tablet Take 1 tablet (10 mg total) by mouth daily. 09/11/21 09/11/22  Harriet Pho,  Tessa N, PA-C  vitamin B-12 (CYANOCOBALAMIN) 100 MCG tablet Take 100 mcg by mouth daily.    [provider]    Family History Family History  Problem Relation Age of Onset   Colon polyps Father    Cancer Father    Heart disease Father    Heart disease Other    Colon cancer Neg Hx     Social History Social History   Tobacco Use   Smoking status: Former    Packs/day: 1.00    Years: 20.00    Total pack years: 20.00    Types: Cigarettes    Start date: 5    Quit date: 1995    Years since quitting: 28.5   Smokeless tobacco: Never  Vaping Use   Vaping Use: Never used  Substance Use Topics   Alcohol use: No    Alcohol/week: 0.0 standard drinks of alcohol   Drug use: No     Allergies   Clindamycin, Clindamycin/lincomycin, and Prochlorperazine   Review of Systems Review of Systems  Respiratory:  Positive for cough.    Per HPI  Physical Exam Triage Vital Signs ED Triage Vitals  Enc Vitals Group     BP 03/01/22 0859 133/83     Pulse Rate 03/01/22 0859 78     Resp 03/01/22 0859 18     Temp 03/01/22 0859 98.7 F (37.1 C)     Temp Source 03/01/22 0859 Oral     SpO2 03/01/22 0859 96 %      Weight --      Height --      Head Circumference --      Peak Flow --      Pain Score 03/01/22 0858 0     Pain Loc --      Pain Edu? --      Excl. in [place]? --    No data found.  Updated Vital Signs BP 133/83 (BP Location: Right Arm)   Pulse 78   Temp 98.7 F (37.1 C) (Oral)   Resp 18   SpO2 96%   Visual Acuity Right Eye Distance:   Left Eye Distance:   Bilateral Distance:    Right Eye Near:   Left Eye Near:    Bilateral Near:     Physical Exam Vitals and nursing note reviewed.  Constitutional:      General: She is not in acute distress.    Appearance: Normal appearance. She is well-developed.  HENT:     Head: Normocephalic and atraumatic.     Right Ear: Tympanic membrane, ear canal and external ear normal.     Left Ear: Tympanic membrane, ear canal and external ear normal.     Nose: Congestion present.     Mouth/Throat:     Mouth: Mucous membranes are moist.     Pharynx: Posterior oropharyngeal erythema present.  Eyes:     Extraocular Movements: Extraocular movements intact.     Conjunctiva/sclera: Conjunctivae normal.     Pupils: Pupils are equal, round, and reactive to light.  Neck:     Thyroid: No thyromegaly.     Trachea: No tracheal deviation.  Cardiovascular:     Rate and Rhythm: Normal rate and regular rhythm.     Pulses: Normal pulses.     Heart sounds: Normal heart sounds.  Pulmonary:     Effort: Pulmonary effort is normal. No respiratory distress.     Breath sounds: Normal breath sounds. No stridor. No wheezing, rhonchi or rales.  Abdominal:  General: Bowel sounds are normal. There is no distension.     Palpations: Abdomen is soft.     Tenderness: There is no abdominal tenderness.  Musculoskeletal:     Cervical back: Normal range of motion and neck supple.  Skin:    General: Skin is warm and dry.  Neurological:     General: No focal deficit present.     Mental Status: She is alert and oriented to person, place, and time.  Psychiatric:         Mood and Affect: Mood normal.        Behavior: Behavior normal.        Thought Content: Thought content normal.        Judgment: Judgment normal.      UC Treatments / Results  Labs (all labs ordered are listed, but only abnormal results are displayed) Labs Reviewed - No data to display  EKG   Radiology No results found.  Procedures Procedures (including critical care time)  Medications Ordered in UC Medications - No data to display  Initial Impression / Assessment and Plan / UC Course  I have reviewed the triage vital signs and the nursing notes.  Pertinent labs & imaging results that were available during my care of the patient were reviewed by me and considered in my medical decision making (see chart for details).  Patient presents for complaints of continued upper respiratory symptoms.  Present symptoms have been present for the past 3 weeks.  Patient was seen previously on 02/20/2022, but since that time symptoms have worsened.  She complains of worsening cough.  Her vital signs are stable, she is in no acute distress.  Her lung sounds are clear on exam.  Given the duration of her symptoms and rebounding of her symptoms, we will start patient on Augmentin.  We will also prescribe Hycodan cough syrup and given her history of allergies to Compazine.  Supportive care recommendations were provided to the patient.  Patient was advised to follow-up in this clinic or with her primary care physician if symptoms do not improve. Final Clinical Impressions(s) / UC Diagnoses   Final diagnoses:  Acute bacterial sinusitis  Cough, unspecified type     Discharge Instructions      Take medication as prescribed. Increase fluids and allow for plenty of rest. Recommend Tylenol or ibuprofen as needed for pain, fever, or general discomfort. Recommend using a humidifier at bedtime during sleep to help with cough and nasal congestion. Sleep elevated on 2 pillows while cough symptoms  persist. Follow-up in this clinic or with your PCP if your symptoms do not improve.      ED Prescriptions     Medication Sig Dispense Auth. Provider   predniSONE (DELTASONE) 20 MG tablet Take 2 tablets (40 mg total) by mouth daily with breakfast for 5 days. 10 tablet Kiari Hosmer-Warren, Alda Lea, NP   amoxicillin-clavulanate (AUGMENTIN) 875-125 MG tablet Take 1 tablet by mouth every 12 (twelve) hours. 14 tablet Micael Barb-Warren, Alda Lea, NP   HYDROcodone bit-homatropine (HYCODAN) 5-1.5 MG/5ML syrup Take 5 mLs by mouth every 6 (six) hours as needed for cough. 120 mL Kairen Hallinan-Warren, Alda Lea, NP      PDMP not reviewed this encounter.   Tish Men, NP 03/01/22 [phone]

## 2022-03-16 ENCOUNTER — Encounter: Payer: Self-pay | Admitting: Emergency Medicine

## 2022-03-16 ENCOUNTER — Ambulatory Visit (INDEPENDENT_AMBULATORY_CARE_PROVIDER_SITE_OTHER): Payer: 59

## 2022-03-16 ENCOUNTER — Ambulatory Visit
Admission: EM | Admit: 2022-03-16 | Discharge: 2022-03-16 | Disposition: A | Discharge status: Home / Self Care | Payer: 59 | Attending: Nurse Practitioner | Admitting: Nurse Practitioner

## 2022-03-16 DIAGNOSIS — J019 Acute sinusitis, unspecified: Secondary | ICD-10-CM | POA: Diagnosis not present

## 2022-03-16 DIAGNOSIS — R059 Cough, unspecified: Secondary | ICD-10-CM | POA: Diagnosis not present

## 2022-03-16 DIAGNOSIS — B9689 Other specified bacterial agents as the cause of diseases classified elsewhere: Secondary | ICD-10-CM | POA: Diagnosis not present

## 2022-03-16 MED ORDER — GUAIFENESIN ER 600 MG PO TB12: 600.0000 mg | ORAL_TABLET | Freq: Two times a day (BID) | ORAL | 0 refills | Status: DC | PRN

## 2022-03-16 MED ORDER — DOXYCYCLINE HYCLATE 100 MG PO CAPS: 100.0000 mg | ORAL_CAPSULE | Freq: Two times a day (BID) | ORAL | 0 refills | Status: AC

## 2022-03-16 MED ORDER — SALINE SPRAY 0.65 % NA SOLN: 1.0000 | NASAL | 0 refills | Status: DC | PRN

## 2022-03-16 NOTE — ED Triage Notes (Signed)
Was seen in July states she is no better.  Continues to cough and have SOB, states head feels congested.  Headache x the past 2 days.  Has been taking sudafed.  States she has facial pain on right side of face.

## 2022-03-16 NOTE — Discharge Instructions (Signed)
-   Chest x-ray today is negative for pneumonia -I believe you have a bacterial sinus infection; please start doxycycline and take it twice daily for 7 days -Please also start Mucinex 600 mg twice daily to help loosen phlegm, increase hydration, use nasal saline rinses -Follow-up with primary care provider if your symptoms persist worsen despite treatment

## 2022-03-16 NOTE — ED Provider Notes (Signed)
[facility]    CSN: [id number] Arrival date & time: 03/16/22  1135      History   Chief Complaint No chief complaint on file.   HPI Kathy Dawson is a 66 y.o. female.   Patient presents with 1 month of productive cough, shortness of breath, chest pain with coughing, nasal congestion, post nasal drainage, "off and on" sore throat, sinus pressure, headache, bilateral ear pressure with right ear popping, fatigue.  She denies chest pain at rest, ear drainage, fever, abdominal pain, nausea/vomiting, diarrhea, decreased appetite, and new rash.   Patient denies history of chronic lung disease, asthma.  Reports she was recently treated with Augmentin about 3 weeks ago.  Denies recent smoking history.       Past Medical History:  Diagnosis Date   Anemia    Hx   Anemia, iron deficiency 06/09/2013   Arthritis    knees, hips, elbow   Complication of anesthesia 14 yrs. 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   GERD (gastroesophageal reflux disease) 08/29/2014   Tubular adenoma of colon 08/29/2014   Dysphagia, pharyngoesophageal phase 08/29/2014   Anemia, iron deficiency 06/09/2013   Difficulty in walking(719.7) 02/05/2013   Stiffness of right knee 02/05/2013   Bone infection, knee ([place]) 12/19/2012   Microcytic anemia 10/09/2012   Pain due to total knee replacement ([place]) 01/31/2012   Rectal bleeding 11/27/2011   Constipation 11/27/2011   Edema of leg 11/19/2011   Osteoarthritis of right knee 11/19/2011   DERANGEMENT MENISCUS 08/26/2008   KNEE, ARTHRITIS, DEGEN./OSTEO 07/25/2008   KNEE PAIN 07/25/2008    Past Surgical History:   Procedure Laterality Date   ABDOMINAL HYSTERECTOMY     ANTERIOR HIP REVISION Right 11/18/2014   Procedure: Right Total Hip Arthroplasty Femoral Revision-Anteior approach;  Surgeon: Marybelle Killings, MD;  Location: [place];  Service: Orthopedics;  Laterality: Right;   APPENDECTOMY     bilateral foot surgery     hammer toes   BLADDER SUSPENSION N/A 02/02/2016   Procedure: TRANSVAGINAL TAPE (TVT) Mid-Urethral Sling PROCEDURE;  Surgeon: Azucena Fallen, MD;  Location: [facility];  Service: Gynecology;  Laterality: N/A;   COLONOSCOPY  12/08/2011   [id number] hemorrhoids/Sigmoid diverticulosis. Colonic polyp (tubular adenoma). Surveillance 2018   COLONOSCOPY N/A 09/09/2017   Procedure: COLONOSCOPY;  Surgeon: Daneil Dolin, MD;  [facility];  Service: Endoscopy;  Laterality: N/A;  2:15pm   CYSTOSCOPY N/A 02/02/2016   Procedure: CYSTOSCOPY;  Surgeon: Azucena Fallen, MD;  Location: [facility];  Service: Gynecology;  Laterality: N/A;   ESOPHAGOGASTRODUODENOSCOPY N/A 09/16/2014   Procedure: ESOPHAGOGASTRODUODENOSCOPY (EGD);  Surgeon: Daneil Dolin, MD;  [facility];  Service: Endoscopy;  Laterality: N/A;  1030am   KNEE SURGERY Right 2011   arthroscopy   MALONEY DILATION N/A 09/16/2014   Procedure: Venia Minks DILATION;  Surgeon: Daneil Dolin, MD;  [facility];  Service: Endoscopy;  Laterality: N/A;   REPLACEMENT TOTAL KNEE Right 01/19/2013   TOTAL HIP ARTHROPLASTY Right 11/04/2014   Procedure: TOTAL HIP ARTHROPLASTY ANTERIOR APPROACH, Possible Local Bonegrafting Acetabular Cyst;  Surgeon: Marybelle Killings, MD;  Location:  [place] [facility];  Service: Orthopedics;  Laterality: Right;   TUBAL LIGATION     VAGINAL HYSTERECTOMY N/A 02/02/2016   Procedure: HYSTERECTOMY VAGINAL;  Surgeon: Azucena Fallen, MD;  Location: [facility];  Service: Gynecology;  Laterality: N/A;    OB History     Gravida  4   Para  3   Term  3   Preterm      AB  1   Living         SAB  1   IAB      Ectopic      Multiple       Live Births               Home Medications    Prior to Admission medications   Medication Sig Start Date End Date Taking? Authorizing Provider  doxycycline (VIBRAMYCIN) 100 MG capsule Take 1 capsule (100 mg total) by mouth 2 (two) times daily for 7 days. 03/16/22 03/23/22 Yes Eulogio Bear, NP  guaiFENesin (MUCINEX) 600 MG 12 hr tablet Take 1 tablet (600 mg total) by mouth 2 (two) times daily as needed for cough or to loosen phlegm. 03/16/22  Yes Noemi Chapel A, NP  sodium chloride (OCEAN) 0.65 % SOLN nasal spray Place 1 spray into both nostrils as needed for congestion. 03/16/22  Yes Eulogio Bear, NP  acetaminophen (TYLENOL) 500 MG tablet Take 1,000 mg by mouth 2 (two) times daily.    [provider]  albuterol (VENTOLIN HFA) 108 (90 Base) MCG/ACT inhaler Inhale 2 puffs into the lungs every 6 (six) hours as needed for wheezing or shortness of breath. Patient not taking: Reported on 09/01/2021 07/27/21   Volney American, PA-C  Ascorbic Acid (VITAMIN C) 100 MG tablet Take 100 mg by mouth daily.    [provider]  brompheniramine-pseudoephedrine-DM 30-2-10 MG/5ML syrup Take 5 mLs by mouth 4 (four) times daily as needed. 02/20/22   Leath-Warren, Alda Lea, NP  calcium carbonate (TUMS - DOSED IN MG ELEMENTAL CALCIUM) 500 MG chewable tablet Chew 1 tablet by mouth daily as needed for indigestion or heartburn. Patient not taking: Reported on 09/01/2021    [provider]  cetirizine (ZYRTEC) 10 MG tablet Take 1 tablet (10 mg total) by mouth daily. 02/20/22   Leath-Warren, Alda Lea, NP  chlorpheniramine-HYDROcodone (TUSSIONEX PENNKINETIC ER) 10-8 MG/5ML SUER Take 5 mLs by mouth every 12 (twelve) hours as needed for cough. Patient not taking: Reported on 09/01/2021 07/27/21   Volney American, PA-C  cholecalciferol (VITAMIN D3) 25 MCG (1000 UNIT) tablet Take 1,000 Units by mouth daily.    [provider]  fluticasone (FLONASE) 50 MCG/ACT  nasal spray Place 2 sprays into both nostrils daily. 02/20/22   Leath-Warren, Alda Lea, NP  HYDROcodone bit-homatropine (HYCODAN) 5-1.5 MG/5ML syrup Take 5 mLs by mouth every 6 (six) hours as needed for cough. 03/01/22   Leath-Warren, Alda Lea, NP  meclizine (ANTIVERT) 25 MG tablet Take 1 tablet (25 mg total) by mouth 3 (three) times daily as needed for dizziness. Patient not taking: Reported on 09/01/2021 11/02/20   Veryl Speak, MD  metoprolol tartrate (LOPRESSOR) 100 MG tablet Take 1 tablet (100 mg total) by mouth once for 1 dose. 09/01/21 09/01/21  Elgie Collard, PA-C  pantoprazole (PROTONIX) 20 MG tablet Take 20 mg by mouth daily.    [provider]  rosuvastatin (CRESTOR) 10 MG tablet Take 1 tablet (10 mg total) by mouth daily. 09/11/21 09/11/22  Elgie Collard,  PA-C  vitamin B-12 (CYANOCOBALAMIN) 100 MCG tablet Take 100 mcg by mouth daily.    [provider]    Family History Family History  Problem Relation Age of Onset   Colon polyps Father    Cancer Father    Heart disease Father    Heart disease Other    Colon cancer Neg Hx     Social History Social History   Tobacco Use   Smoking status: Former    Packs/day: 1.00    Years: 20.00    Total pack years: 20.00    Types: Cigarettes    Start date: 79    Quit date: 1995    Years since quitting: 28.6   Smokeless tobacco: Never  Vaping Use   Vaping Use: Never used  Substance Use Topics   Alcohol use: No    Alcohol/week: 0.0 standard drinks of alcohol   Drug use: No     Allergies   Clindamycin, Clindamycin/lincomycin, and Prochlorperazine   Review of Systems Review of Systems Per HPI  Physical Exam Triage Vital Signs ED Triage Vitals [03/16/22 1141]  Enc Vitals Group     BP (!) 142/82     Pulse Rate 91     Resp 18     Temp 98.9 F (37.2 C)     Temp Source Oral     SpO2 96 %     Weight      Height      Head Circumference      Peak Flow      Pain Score 5     Pain Loc      Pain Edu?       Excl. in [place]?    No data found.  Updated Vital Signs BP (!) 142/82 (BP Location: Right Arm)   Pulse 91   Temp 98.9 F (37.2 C) (Oral)   Resp 18   SpO2 96%   Visual Acuity Right Eye Distance:   Left Eye Distance:   Bilateral Distance:    Right Eye Near:   Left Eye Near:    Bilateral Near:     Physical Exam Vitals and nursing note reviewed.  Constitutional:      General: She is not in acute distress.    Appearance: Normal appearance. She is not toxic-appearing.  HENT:     Head: Normocephalic and atraumatic.     Right Ear: Tympanic membrane, ear canal and external ear normal. There is no impacted cerumen.     Left Ear: Tympanic membrane, ear canal and external ear normal. There is no impacted cerumen.     Nose: No congestion or rhinorrhea.     Right Sinus: Maxillary sinus tenderness present. No frontal sinus tenderness.     Left Sinus: Maxillary sinus tenderness present. No frontal sinus tenderness.     Mouth/Throat:     Mouth: Mucous membranes are moist.     Pharynx: Oropharynx is clear. No oropharyngeal exudate or posterior oropharyngeal erythema.  Cardiovascular:     Rate and Rhythm: Normal rate and regular rhythm.  Pulmonary:     Effort: Pulmonary effort is normal. No respiratory distress.     Breath sounds: Normal breath sounds. No wheezing, rhonchi or rales.  Musculoskeletal:     Cervical back: Normal range of motion and neck supple.  Lymphadenopathy:     Cervical: No cervical adenopathy.  Skin:    General: Skin is warm and dry.     Capillary Refill: Capillary refill takes less than 2 seconds.  Coloration: Skin is not jaundiced or pale.     Findings: No erythema or rash.  Neurological:     Mental Status: She is alert and oriented to person, place, and time.  Psychiatric:        Behavior: Behavior is cooperative.      UC Treatments / Results  Labs (all labs ordered are listed, but only abnormal results are displayed) Labs Reviewed - No data to  display  EKG   Radiology DG Chest 2 View  Result Date: 03/16/2022 CLINICAL DATA:  Cough for 1 month, former smoker EXAM: CHEST - 2 VIEW COMPARISON:  08/30/2021 FINDINGS: Normal heart size, mediastinal contours, and pulmonary vascularity. Lungs clear. No pleural effusion or pneumothorax. Bones unremarkable. IMPRESSION: Normal exam. Electronically Signed   By: Lavonia Dana M.D.   On: 03/16/2022 11:58    Procedures Procedures (including critical care time)  Medications Ordered in UC Medications - No data to display  Initial Impression / Assessment and Plan / UC Course  I have reviewed the triage vital signs and the nursing notes.  Pertinent labs & imaging results that were available during my care of the patient were reviewed by me and considered in my medical decision making (see chart for details).    Patient is a well-appearing 66 year old female presenting for productive cough and sinus pressure today.  Chest x-ray is negative for pneumonia.  We will treat patient for a bacterial sinus infection with doxycycline twice daily for 7 days.  Also discussed supportive care with Mucinex, increase hydration, nasal saline rinses.  Encouraged follow-up with primary care provider if symptoms persist or worsen despite treatment.  The patient was given the opportunity to ask questions.  All questions answered to their satisfaction.  The patient is in agreement to this plan.   Final Clinical Impressions(s) / UC Diagnoses   Final diagnoses:  Acute bacterial sinusitis     Discharge Instructions      - Chest x-ray today is negative for pneumonia -I believe you have a bacterial sinus infection; please start doxycycline and take it twice daily for 7 days -Please also start Mucinex 600 mg twice daily to help loosen phlegm, increase hydration, use nasal saline rinses -Follow-up with primary care provider if your symptoms persist worsen despite treatment   ED Prescriptions     Medication Sig  Dispense Auth. Provider   doxycycline (VIBRAMYCIN) 100 MG capsule Take 1 capsule (100 mg total) by mouth 2 (two) times daily for 7 days. 14 capsule Noemi Chapel A, NP   guaiFENesin (MUCINEX) 600 MG 12 hr tablet Take 1 tablet (600 mg total) by mouth 2 (two) times daily as needed for cough or to loosen phlegm. 30 tablet Noemi Chapel A, NP   sodium chloride (OCEAN) 0.65 % SOLN nasal spray Place 1 spray into both nostrils as needed for congestion. 88 mL Eulogio Bear, NP      PDMP not reviewed this encounter.   Eulogio Bear, NP 03/16/22 1328

## 2022-03-22 DIAGNOSIS — M19041 Primary osteoarthritis, right hand: Secondary | ICD-10-CM | POA: Diagnosis not present

## 2022-03-22 DIAGNOSIS — M19042 Primary osteoarthritis, left hand: Secondary | ICD-10-CM | POA: Diagnosis not present

## 2022-03-22 DIAGNOSIS — M19021 Primary osteoarthritis, right elbow: Secondary | ICD-10-CM | POA: Diagnosis not present

## 2022-03-22 DIAGNOSIS — M19012 Primary osteoarthritis, left shoulder: Secondary | ICD-10-CM | POA: Diagnosis not present

## 2022-03-22 DIAGNOSIS — M19011 Primary osteoarthritis, right shoulder: Secondary | ICD-10-CM | POA: Diagnosis not present

## 2022-03-22 DIAGNOSIS — M180 Bilateral primary osteoarthritis of first carpometacarpal joints: Secondary | ICD-10-CM | POA: Diagnosis not present

## 2022-03-22 DIAGNOSIS — M1990 Unspecified osteoarthritis, unspecified site: Secondary | ICD-10-CM | POA: Diagnosis not present

## 2022-03-22 DIAGNOSIS — R809 Proteinuria, unspecified: Secondary | ICD-10-CM | POA: Diagnosis not present

## 2022-03-22 DIAGNOSIS — M19022 Primary osteoarthritis, left elbow: Secondary | ICD-10-CM | POA: Diagnosis not present

## 2022-03-31 DIAGNOSIS — R600 Localized edema: Secondary | ICD-10-CM | POA: Diagnosis not present

## 2022-03-31 DIAGNOSIS — R601 Generalized edema: Secondary | ICD-10-CM | POA: Diagnosis not present

## 2022-03-31 DIAGNOSIS — M2550 Pain in unspecified joint: Secondary | ICD-10-CM | POA: Diagnosis not present

## 2022-03-31 DIAGNOSIS — G8929 Other chronic pain: Secondary | ICD-10-CM | POA: Diagnosis not present

## 2022-06-09 DIAGNOSIS — E785 Hyperlipidemia, unspecified: Secondary | ICD-10-CM | POA: Diagnosis not present

## 2022-06-16 DIAGNOSIS — E785 Hyperlipidemia, unspecified: Secondary | ICD-10-CM | POA: Diagnosis not present

## 2022-06-16 DIAGNOSIS — R0600 Dyspnea, unspecified: Secondary | ICD-10-CM | POA: Diagnosis not present

## 2022-06-16 DIAGNOSIS — K219 Gastro-esophageal reflux disease without esophagitis: Secondary | ICD-10-CM | POA: Diagnosis not present

## 2022-06-16 DIAGNOSIS — Z2820 Immunization not carried out because of patient decision for unspecified reason: Secondary | ICD-10-CM | POA: Diagnosis not present

## 2022-06-16 DIAGNOSIS — L309 Dermatitis, unspecified: Secondary | ICD-10-CM | POA: Diagnosis not present

## 2022-06-16 DIAGNOSIS — R229 Localized swelling, mass and lump, unspecified: Secondary | ICD-10-CM | POA: Diagnosis not present

## 2022-06-16 DIAGNOSIS — I890 Lymphedema, not elsewhere classified: Secondary | ICD-10-CM | POA: Diagnosis not present

## 2022-08-03 DIAGNOSIS — M25512 Pain in left shoulder: Secondary | ICD-10-CM | POA: Diagnosis not present

## 2022-09-03 ENCOUNTER — Encounter: Payer: Self-pay | Admitting: *Deleted

## 2022-10-04 ENCOUNTER — Telehealth (INDEPENDENT_AMBULATORY_CARE_PROVIDER_SITE_OTHER): Payer: Self-pay | Admitting: *Deleted

## 2022-10-04 NOTE — Telephone Encounter (Signed)
  Procedure: colonoscopy  Estimated body mass index is 37.87 kg/m as calculated from the following:   Height as of 09/01/21: 5' 3"$  (1.6 m).   Weight as of 09/01/21: 213 lb 12.8 oz (97 kg).   Have you had a colonoscopy before?  09/09/17, Dr. Gala Romney  Do you have family history of colon cancer?    Do you have a family history of polyps?   Previous colonoscopy with polyps removed? yes  Do you have a history colorectal cancer?     Are you diabetic?  no  Do you have a prosthetic or mechanical heart valve?   Do you have a pacemaker/defibrillator?     Have you had endocarditis/atrial fibrillation?  no  Do you use supplemental oxygen/CPAP?  no  Have you had joint replacement within the last 12 months?  no  Do you tend to be constipated or have to use laxatives?  yes   Do you have history of alcohol use? If yes, how much and how often.  no  Do you have history or are you using drugs? If yes, what do are you  using?  no  Have you ever had a stroke/heart attack?  no  Have you ever had a heart or other vascular stent placed,?no  Do you take weight loss medication? no  female patients,: have you had a hysterectomy? yes                              are you post menopausal?  yes                              do you still have your menstrual cycle? no    Date of last menstrual period? Over 15 yrs ago  Do you take any blood-thinning medications such as: (Plavix, aspirin, Coumadin, Aggrenox, Brilinta, Xarelto, Eliquis, Pradaxa, Savaysa or Effient)? no  If yes we need the name, milligram, dosage and who is prescribing doctor:               Current Outpatient Medications  Medication Sig Dispense Refill   acetaminophen (TYLENOL) 500 MG tablet Take 1,000 mg by mouth 2 (two) times daily.     OVER THE COUNTER MEDICATION Lynch system support 2 per day Biotin 5055mh daily Magnesium gummies 2 per day 2595mVegetable laxative as needed Zinc 5063maily CBD gummies 45m33m needed      pantoprazole (PROTONIX) 40 MG tablet Take 40 mg by mouth daily as needed.     No current facility-administered medications for this visit.    Allergies  Allergen Reactions   Clindamycin Hives and Itching   Clindamycin/Lincomycin Hives and Itching   Prochlorperazine Other (See Comments)    Seizure like symptoms

## 2022-10-05 NOTE — Telephone Encounter (Signed)
How often needing to take Laxatives? May need to have office visit prior.

## 2022-10-05 NOTE — Telephone Encounter (Signed)
LMOVM to call back to find out

## 2022-10-07 NOTE — Telephone Encounter (Signed)
Pt had left vm returning call  [facility]

## 2022-10-20 ENCOUNTER — Encounter (INDEPENDENT_AMBULATORY_CARE_PROVIDER_SITE_OTHER): Payer: Self-pay | Admitting: *Deleted

## 2022-10-20 NOTE — Telephone Encounter (Signed)
Letter mailed

## 2022-11-10 ENCOUNTER — Telehealth: Payer: Self-pay | Admitting: *Deleted

## 2022-11-10 NOTE — Telephone Encounter (Signed)
Pt left vm wanting a return call to schedule her procedure.  [place]

## 2022-11-22 DIAGNOSIS — R509 Fever, unspecified: Secondary | ICD-10-CM | POA: Diagnosis not present

## 2022-11-22 DIAGNOSIS — Z6836 Body mass index (BMI) 36.0-36.9, adult: Secondary | ICD-10-CM | POA: Diagnosis not present

## 2022-11-22 DIAGNOSIS — E669 Obesity, unspecified: Secondary | ICD-10-CM | POA: Diagnosis not present

## 2022-11-22 DIAGNOSIS — J069 Acute upper respiratory infection, unspecified: Secondary | ICD-10-CM | POA: Diagnosis not present

## 2022-11-29 DIAGNOSIS — M19071 Primary osteoarthritis, right ankle and foot: Secondary | ICD-10-CM | POA: Diagnosis not present

## 2022-11-29 DIAGNOSIS — M7731 Calcaneal spur, right foot: Secondary | ICD-10-CM | POA: Diagnosis not present

## 2022-11-29 DIAGNOSIS — M351 Other overlap syndromes: Secondary | ICD-10-CM | POA: Diagnosis not present

## 2022-11-29 DIAGNOSIS — M25552 Pain in left hip: Secondary | ICD-10-CM | POA: Diagnosis not present

## 2022-11-29 DIAGNOSIS — M19072 Primary osteoarthritis, left ankle and foot: Secondary | ICD-10-CM | POA: Diagnosis not present

## 2022-11-29 DIAGNOSIS — Z96642 Presence of left artificial hip joint: Secondary | ICD-10-CM | POA: Diagnosis not present

## 2022-11-29 DIAGNOSIS — M7732 Calcaneal spur, left foot: Secondary | ICD-10-CM | POA: Diagnosis not present

## 2022-11-29 DIAGNOSIS — M25551 Pain in right hip: Secondary | ICD-10-CM | POA: Diagnosis not present

## 2022-12-01 DIAGNOSIS — E785 Hyperlipidemia, unspecified: Secondary | ICD-10-CM | POA: Diagnosis not present

## 2022-12-08 DIAGNOSIS — J069 Acute upper respiratory infection, unspecified: Secondary | ICD-10-CM | POA: Diagnosis not present

## 2022-12-08 DIAGNOSIS — Z7182 Exercise counseling: Secondary | ICD-10-CM | POA: Diagnosis not present

## 2022-12-08 DIAGNOSIS — K219 Gastro-esophageal reflux disease without esophagitis: Secondary | ICD-10-CM | POA: Diagnosis not present

## 2022-12-08 DIAGNOSIS — I890 Lymphedema, not elsewhere classified: Secondary | ICD-10-CM | POA: Diagnosis not present

## 2022-12-08 DIAGNOSIS — M1990 Unspecified osteoarthritis, unspecified site: Secondary | ICD-10-CM | POA: Diagnosis not present

## 2022-12-08 DIAGNOSIS — Z713 Dietary counseling and surveillance: Secondary | ICD-10-CM | POA: Diagnosis not present

## 2022-12-08 DIAGNOSIS — J302 Other seasonal allergic rhinitis: Secondary | ICD-10-CM | POA: Diagnosis not present

## 2022-12-08 DIAGNOSIS — Z6837 Body mass index (BMI) 37.0-37.9, adult: Secondary | ICD-10-CM | POA: Diagnosis not present

## 2022-12-08 DIAGNOSIS — R600 Localized edema: Secondary | ICD-10-CM | POA: Diagnosis not present

## 2022-12-08 DIAGNOSIS — R0600 Dyspnea, unspecified: Secondary | ICD-10-CM | POA: Diagnosis not present

## 2022-12-08 DIAGNOSIS — E785 Hyperlipidemia, unspecified: Secondary | ICD-10-CM | POA: Diagnosis not present

## 2022-12-21 ENCOUNTER — Other Ambulatory Visit (HOSPITAL_COMMUNITY): Payer: Self-pay | Admitting: Internal Medicine

## 2022-12-21 DIAGNOSIS — Z1231 Encounter for screening mammogram for malignant neoplasm of breast: Secondary | ICD-10-CM

## 2022-12-28 NOTE — Telephone Encounter (Signed)
Pt states she has been using magnesium gummies and herbs to help with her issue. She says she has started back using laxatives when she needs them. She says she can go from diarrhea to constipation in a matter of 12 hours. Advised pt that she needs to have an OV prior to scheduling. Transferred to front desk. Spoke with Tobi Bastos, NP and she agreed that pt needed to be seen.

## 2022-12-30 ENCOUNTER — Ambulatory Visit (HOSPITAL_COMMUNITY): Payer: 59

## 2023-01-05 ENCOUNTER — Ambulatory Visit (HOSPITAL_COMMUNITY)
Admission: RE | Admit: 2023-01-05 | Discharge: 2023-01-05 | Disposition: A | Discharge status: Home / Self Care | Payer: 59 | Source: Ambulatory Visit | Attending: Internal Medicine | Admitting: Internal Medicine

## 2023-01-05 DIAGNOSIS — Z1231 Encounter for screening mammogram for malignant neoplasm of breast: Secondary | ICD-10-CM | POA: Insufficient documentation

## 2023-01-22 IMAGING — MG MM DIGITAL SCREENING BILAT W/ TOMO AND CAD
6 of 10 series · 6 of 30 positions shown · non-contrast
Comparison: Previous exam(s).

CLINICAL DATA: Screening.

EXAM:
DIGITAL SCREENING BILATERAL MAMMOGRAM WITH TOMOSYNTHESIS AND CAD
TECHNIQUE: Bilateral screening digital craniocaudal and mediolateral oblique
mammograms were obtained. Bilateral screening digital breast
tomosynthesis was performed. The images were evaluated with
computer-aided detection.

[R CC synth-2D]
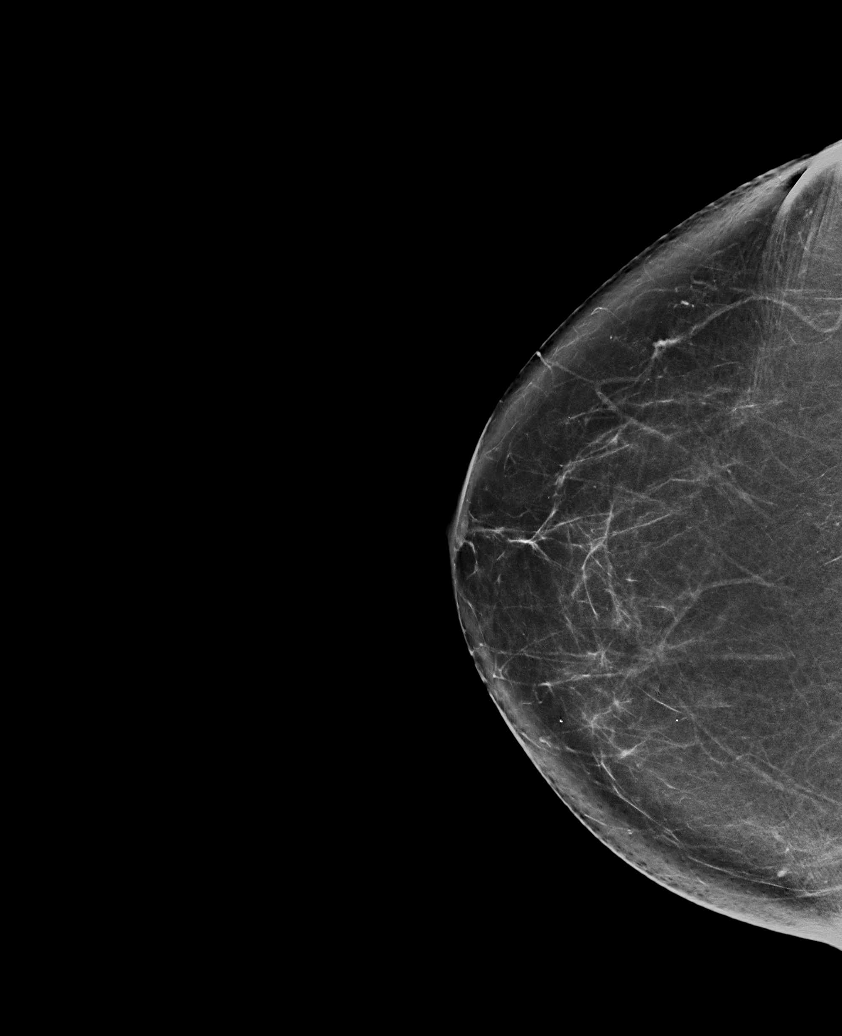

[L MLO synth-2D (1 of 2)]
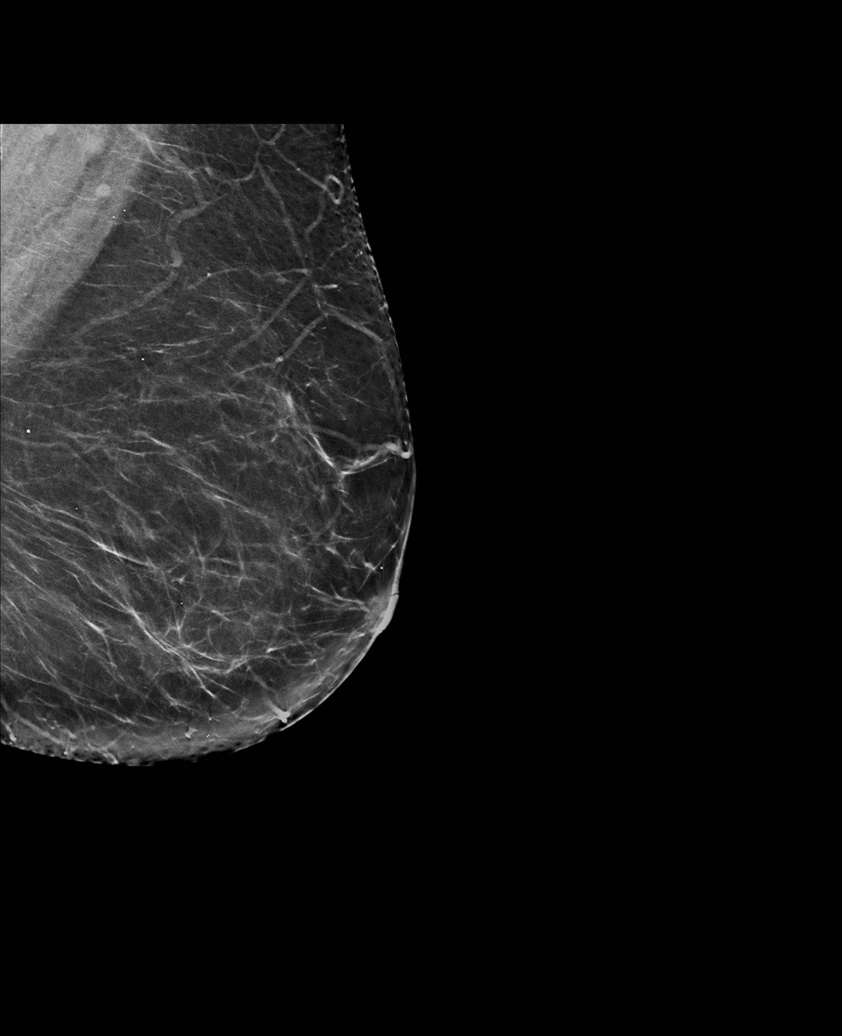

[R MLO synth-2D]
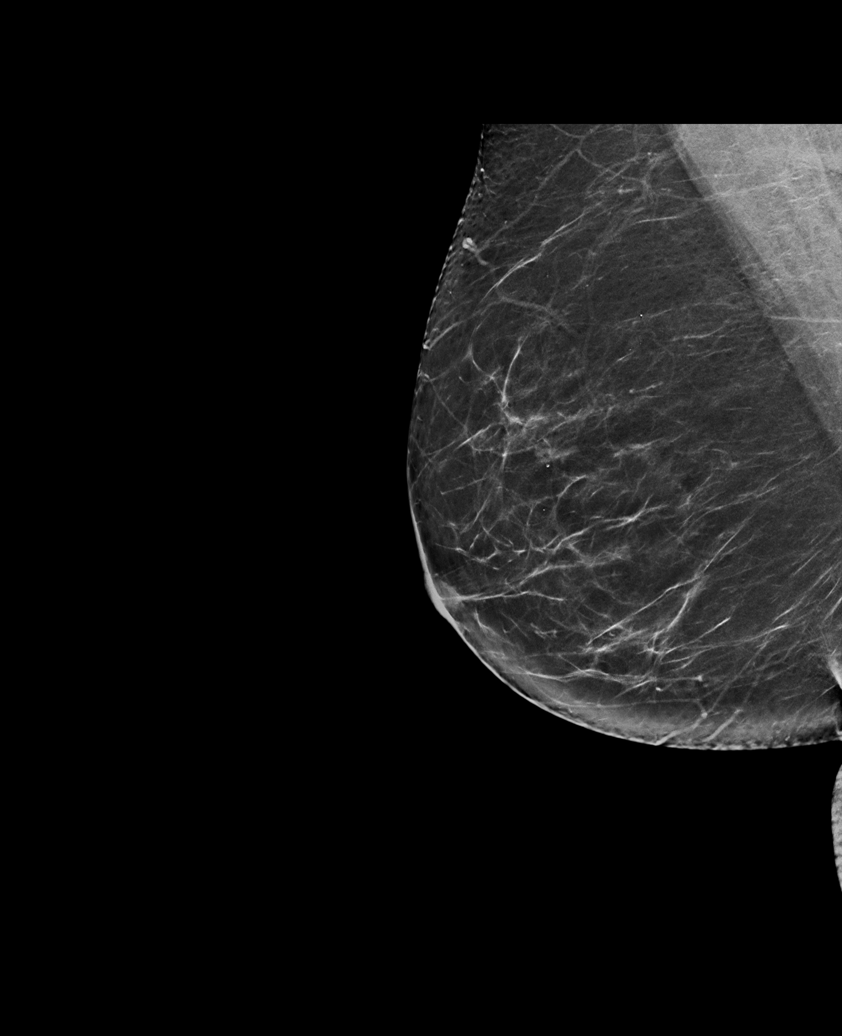

[L CC synth-2D]
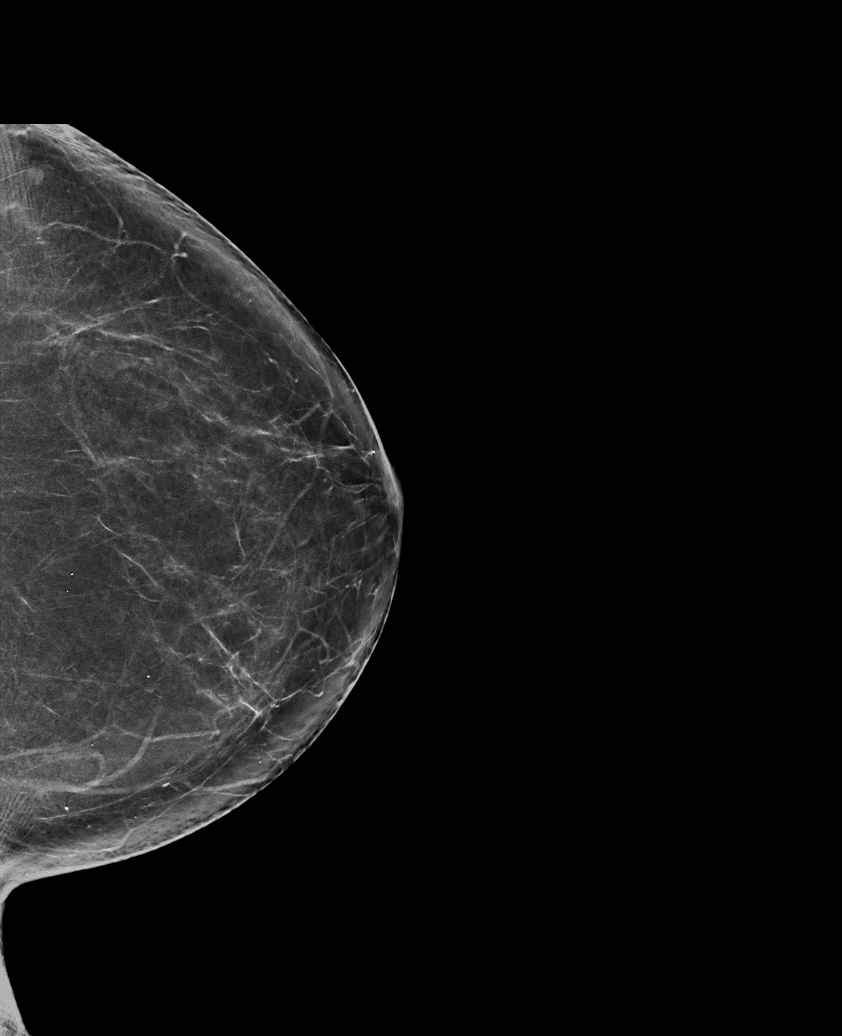

[L MLO synth-2D (2 of 2)]
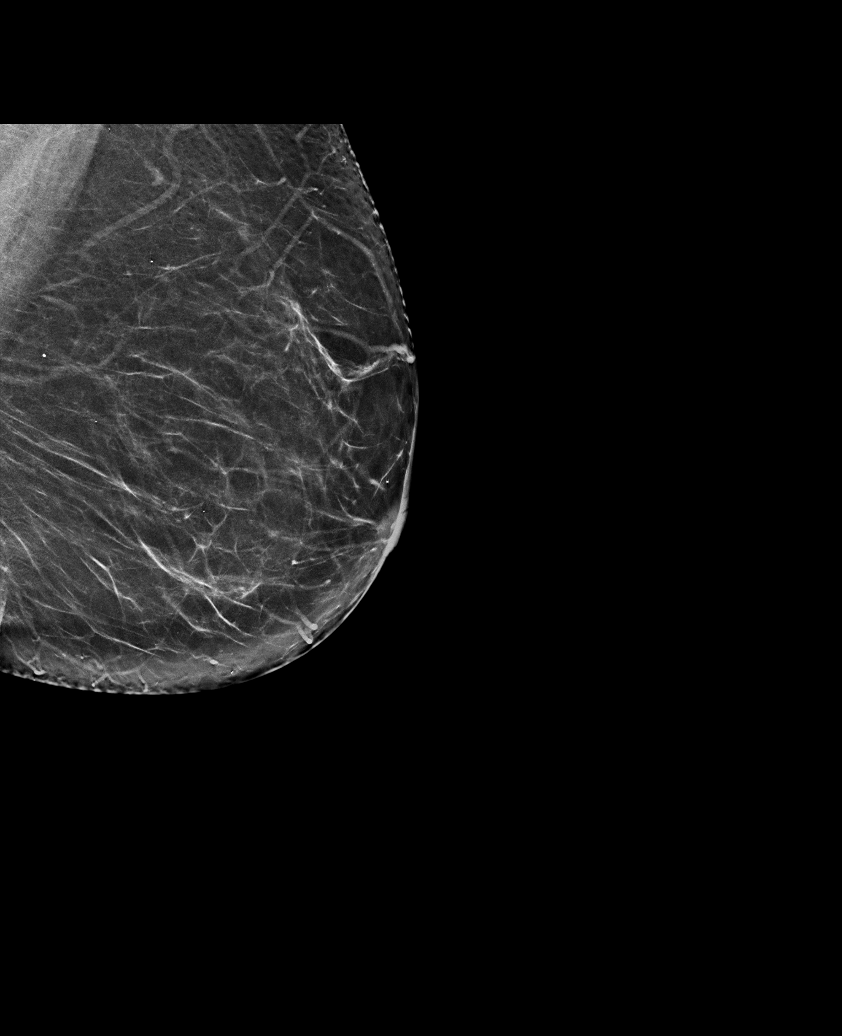

[R CC tomo · tomo slice 40/79.0]
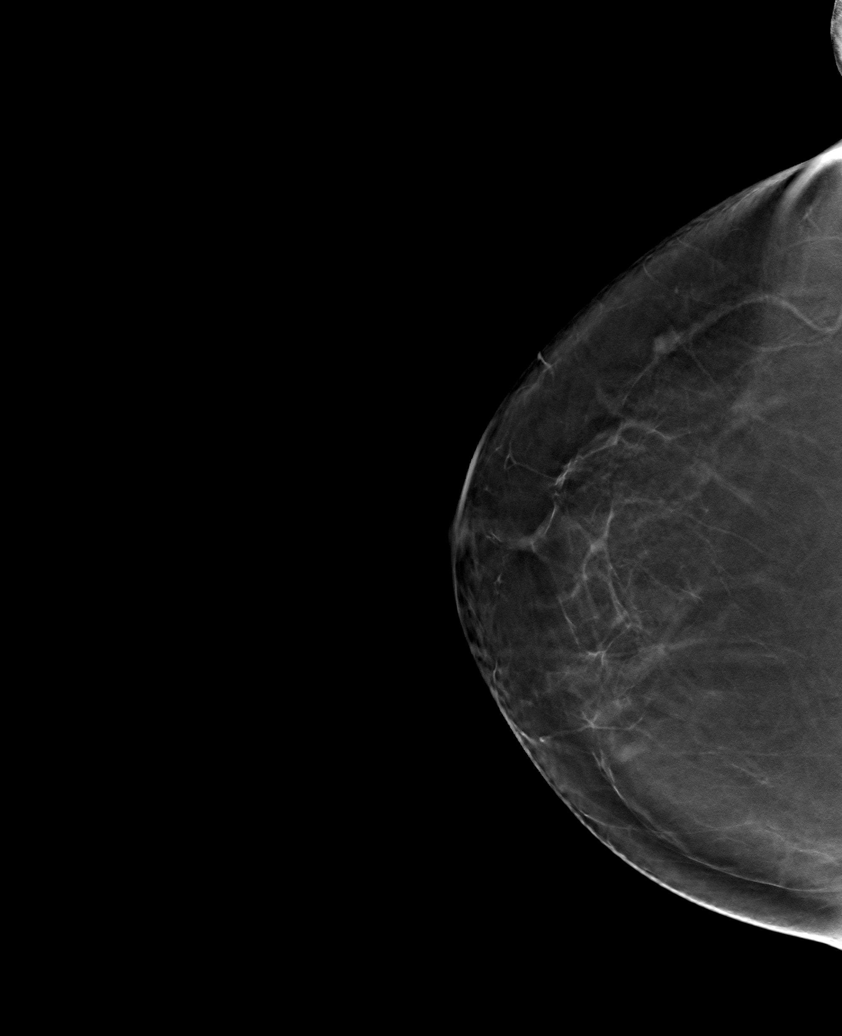

[6 of 30 positions shown; findings below may reference images not displayed]

ACR Breast Density Category b: There are scattered areas of
fibroglandular density.
FINDINGS: There are no findings suspicious for malignancy. The images were
evaluated with computer-aided detection.
IMPRESSION: No mammographic evidence of malignancy. A result letter of this
screening mammogram will be mailed directly to the patient.

RECOMMENDATION:
Screening mammogram in one year. (Code:[id number]-[id number])

BI-RADS CATEGORY  1: Negative.

## 2023-02-15 ENCOUNTER — Ambulatory Visit: Payer: 59 | Admitting: Internal Medicine

## 2023-03-28 ENCOUNTER — Other Ambulatory Visit (HOSPITAL_COMMUNITY): Payer: Self-pay | Admitting: Internal Medicine

## 2023-03-28 ENCOUNTER — Other Ambulatory Visit (HOSPITAL_BASED_OUTPATIENT_CLINIC_OR_DEPARTMENT_OTHER): Payer: Self-pay | Admitting: Internal Medicine

## 2023-03-28 DIAGNOSIS — M351 Other overlap syndromes: Secondary | ICD-10-CM

## 2023-04-08 ENCOUNTER — Ambulatory Visit (HOSPITAL_COMMUNITY): Payer: 59

## 2023-04-08 ENCOUNTER — Encounter (HOSPITAL_COMMUNITY): Payer: Self-pay

## 2023-05-03 ENCOUNTER — Encounter: Payer: Self-pay | Admitting: Internal Medicine

## 2023-05-03 ENCOUNTER — Ambulatory Visit: Payer: 59 | Admitting: Internal Medicine

## 2023-05-03 VITALS — BP 132/75 | HR 78 | Temp 97.7°F | Ht 65.0 in | Wt 224.0 lb

## 2023-05-03 DIAGNOSIS — A048 Other specified bacterial intestinal infections: Secondary | ICD-10-CM

## 2023-05-03 NOTE — Patient Instructions (Signed)
It was good to see you again today!  As discussed, you are due for a surveillance colonoscopy (history of colon polyps) ASA 3  To further evaluate your intermittent right upper quadrant abdominal pain, we will pursue a gallbladder ultrasound.  To document that H. pylori has been eradicated, will obtain a stool antigen test (no Protonix or antibiotics for 2 weeks prior to the procedure)  Further recommendations to follow.

## 2023-05-03 NOTE — Progress Notes (Signed)
Primary Care Physician:  Benita Stabile, MD Primary Gastroenterologist:  Dr. Jena Gauss  Pre-Procedure History & Physical: HPI:  Kathy Dawson is a 67 y.o. female here for for follow-up.  History of colonic polyps removed previously negative colonoscopy 2019; due for surveillance examination.  Patient has issues with lymphedema of the upper extremities and joint pain she is being a extensively evaluated with serologies etc.  Followed by physician down at [facility]  Occasional GERD for which she takes Protonix as needed no dysphagia.  Negative EGD years ago (underwent empiric Maloney dilation for dysphagia); it resolved.  Treated for H. pylori gastritis previously; no evidence of eradication in the record.  Intermittent right upper quadrant abdominal pain.  Diminutive gallbladder seen previously on CT  Past Medical History:  Diagnosis Date   Anemia    Hx   Anemia, iron deficiency 06/09/2013   Arthritis    knees, hips, elbow   Complication of anesthesia 14 yrs. ago   woke up during the appendectomy   GERD (gastroesophageal reflux disease)    otc med prn   Knee pain     Past Surgical History:  Procedure Laterality Date   ABDOMINAL HYSTERECTOMY     ANTERIOR HIP REVISION Right 11/18/2014   Procedure: Right Total Hip Arthroplasty Femoral Revision-Anteior approach;  Surgeon: Eldred Manges, MD;  Location: [facility];  Service: Orthopedics;  Laterality: Right;   APPENDECTOMY     bilateral foot surgery     hammer toes   BLADDER SUSPENSION N/A 02/02/2016   Procedure: TRANSVAGINAL TAPE (TVT) Mid-Urethral Sling PROCEDURE;  Surgeon: Shea Evans, MD;  Location: [facility];  Service: Gynecology;  Laterality: N/A;   COLONOSCOPY  12/08/2011   [id number] hemorrhoids/Sigmoid diverticulosis. Colonic polyp (tubular adenoma). Surveillance 2018   COLONOSCOPY N/A 09/09/2017   Procedure: COLONOSCOPY;  Surgeon: Corbin Ade, MD;  [facility];  Service: Endoscopy;  Laterality: N/A;  2:15pm   CYSTOSCOPY  N/A 02/02/2016   Procedure: CYSTOSCOPY;  Surgeon: Shea Evans, MD;  Location: [facility];  Service: Gynecology;  Laterality: N/A;   ESOPHAGOGASTRODUODENOSCOPY N/A 09/16/2014   Procedure: ESOPHAGOGASTRODUODENOSCOPY (EGD);  Surgeon: Corbin Ade, MD;  [facility];  Service: Endoscopy;  Laterality: N/A;  1030am   KNEE SURGERY Right 2011   arthroscopy   MALONEY DILATION N/A 09/16/2014   Procedure: Elease Hashimoto DILATION;  Surgeon: Corbin Ade, MD;  [facility];  Service: Endoscopy;  Laterality: N/A;   REPLACEMENT TOTAL KNEE Right 01/19/2013   TOTAL HIP ARTHROPLASTY Right 11/04/2014   Procedure: TOTAL HIP ARTHROPLASTY ANTERIOR APPROACH, Possible Local Bonegrafting Acetabular Cyst;  Surgeon: Eldred Manges, MD;  Location: [facility];  Service: Orthopedics;  Laterality: Right;   TUBAL LIGATION     VAGINAL HYSTERECTOMY N/A 02/02/2016   Procedure: HYSTERECTOMY VAGINAL;  Surgeon: Shea Evans, MD;  Location: [facility];  Service: Gynecology;  Laterality: N/A;    Prior to Admission medications   Medication Sig Start Date End Date Taking? Authorizing Provider  acetaminophen (TYLENOL) 500 MG tablet Take 1,000 mg by mouth 2 (two) times daily.   Yes [provider]  milk thistle 175 MG tablet Take 175 mg by mouth daily.   Yes [provider]  OVER THE COUNTER MEDICATION Lynch system support 2 per day Biotin daily Magnesium gummies 2 per day 250mg  Vegetable laxative as needed Zinc 50mg  daily CBD gummies 25mg  as needed   Yes [provider]  pantoprazole (PROTONIX) 40 MG tablet Take 40 mg  by mouth daily as needed.   Yes [provider]    Allergies as of 05/03/2023 - Review Complete 05/03/2023  Allergen Reaction Noted   Clindamycin Hives and Itching 09/04/2014   Clindamycin/lincomycin Hives and Itching 09/04/2014   Prochlorperazine Other (See Comments) 06/18/2013    Family History  Problem Relation Age of Onset   Colon polyps Father    Cancer Father     Heart disease Father    Heart disease Other    Colon cancer Neg Hx     Social History   Socioeconomic History   Marital status: Married    Spouse name: Not on file   Number of children: Not on file   Years of education: Not on file   Highest education level: Not on file  Occupational History   Occupation: tobacco farmer  Tobacco Use   Smoking status: Former    Current packs/day: 0.00    Average packs/day: 1 pack/day for 20.0 years (20.0 ttl pk-yrs)    Types: Cigarettes    Start date: 42    Quit date: 1995    Years since quitting: 29.7   Smokeless tobacco: Never  Vaping Use   Vaping status: Never Used  Substance and Sexual Activity   Alcohol use: No    Alcohol/week: 0.0 standard drinks of alcohol   Drug use: No   Sexual activity: Not Currently    Birth control/protection: None  Other Topics Concern   Not on file  Social History Narrative   Not on file   Social Determinants of Health   Financial Resource Strain: Not on file  Food Insecurity: Not on file  Transportation Needs: Not on file  Physical Activity: Not on file  Stress: Not on file  Social Connections: Not on file  Intimate Partner Violence: Not on file    Review of Systems: See HPI, otherwise negative ROS  Physical Exam: BP 132/75 (BP Location: Right Arm, Patient Position: Sitting, Cuff Size: Large)   Pulse 78   Temp 97.7 F (36.5 C) (Oral)   Ht 5\' 5"  (1.651 m)   Wt 224 lb (101.6 kg)   SpO2 96%   BMI 37.28 kg/m  General:   Alert,  Well-developed, well-nourished, pleasant and cooperative in NAD Neck:  Supple; no masses or thyromegaly. No significant cervical adenopathy. Lungs:  Clear throughout to auscultation.   No wheezes, crackles, or rhonchi. No acute distress. Heart:  Regular rate and rhythm; no murmurs, clicks, rubs,  or gallops. Abdomen: Non-distended, normal bowel sounds.  Soft and nontender without appreciable mass or hepatosplenomegaly.  Extremities: Lymphedema both  forearms.  Impression/Plan: 67 year old lady due for surveillance colonoscopy/history of colon polyps.  Intermittent right upper quadrant abdominal pain-nonspecific.  I believe it would be reasonable to ultrasound her gallbladder at this time.  History of H. pylori gastritis-treated but eradication not proven.   Recommendations:  As discussed, you are due for a surveillance colonoscopy (history of colon polyps) ASA 3.  The risks, benefits, limitations, alternatives and imponderables have been reviewed with the patient. Questions have been answered. All parties are agreeable.    To further evaluate your intermittent right upper quadrant abdominal pain, we will pursue a gallbladder ultrasound.  To document that H. pylori has been eradicated, will obtain a stool antigen test (no Protonix or antibiotics for 2 weeks prior to the procedure)  Further recommendations to follow.     Notice: This dictation was prepared with Dragon dictation along with smaller phrase technology. Any transcriptional errors that  result from this process are unintentional and may not be corrected upon review.

## 2023-05-04 ENCOUNTER — Encounter: Payer: Self-pay | Admitting: *Deleted

## 2023-05-04 ENCOUNTER — Other Ambulatory Visit: Payer: Self-pay | Admitting: *Deleted

## 2023-05-04 DIAGNOSIS — M25572 Pain in left ankle and joints of left foot: Secondary | ICD-10-CM | POA: Diagnosis not present

## 2023-05-04 DIAGNOSIS — R1011 Right upper quadrant pain: Secondary | ICD-10-CM

## 2023-05-04 MED ORDER — PEG 3350-KCL-NA BICARB-NACL 420 G PO SOLR: 4000.0000 mL | Freq: Once | ORAL | 0 refills | Status: AC

## 2023-05-09 ENCOUNTER — Ambulatory Visit (HOSPITAL_COMMUNITY): Admission: RE | Admit: 2023-05-09 | Payer: 59 | Source: Ambulatory Visit

## 2023-05-19 DIAGNOSIS — A048 Other specified bacterial intestinal infections: Secondary | ICD-10-CM | POA: Diagnosis not present

## 2023-05-21 LAB — H. PYLORI ANTIGEN, STOOL: H pylori Ag, Stl: NEGATIVE

## 2023-05-23 NOTE — Progress Notes (Signed)
Was done on 05/19/2023

## 2023-06-03 NOTE — Patient Instructions (Signed)
Kathy Dawson  06/03/2023     @PREFPERIOPPHARMACY @   Your procedure is scheduled on  06/08/2023.   Report to [facility] at  1145  A.M.   Call this number if you have problems the morning of surgery:  [phone]  If you experience any cold or flu symptoms such as cough, fever, chills, shortness of breath, etc. between now and your scheduled surgery, please notify us at the above number.   Remember:  Follow the diet and prep instructions given to you by the office.    You may drink clear liquids until 0945 am on 06/08/2023.    Clear liquids allowed are:                    Water, Juice (No red color; non-citric and without pulp; diabetics please choose diet or no sugar options), Carbonated beverages (diabetics please choose diet or no sugar options), Clear Tea (No creamer, milk, or cream, including half & half and powdered creamer), Black Coffee Only (No creamer, milk or cream, including half & half and powdered creamer), and Clear Sports drink (No red color; diabetics please choose diet or no sugar options)    Take these medicines the morning of surgery with A SIP OF WATER                                      pantoprazole.    Do not wear jewelry, make-up or nail polish, including gel polish,  artificial nails, or any other type of covering on natural nails (fingers and  toes).  Do not wear lotions, powders, or perfumes, or deodorant.  Do not shave 48 hours prior to surgery.  Men may shave face and neck.  Do not bring valuables to the hospital.  [facility] is not responsible for any belongings or valuables.  Contacts, dentures or bridgework may not be worn into surgery.  Leave your suitcase in the car.  After surgery it may be brought to your room.  For patients admitted to the hospital, discharge time will be determined by your treatment team.  Patients discharged the day of surgery will not be allowed to drive home and must have someone with them for 24 hours  before your procedure.    Special instructions:   DO NOT smoke tobacco or vape for 24 hours before your procedure.  Please read over the following fact sheets that you were given. Anesthesia Post-op Instructions and Care and Recovery After Surgery      Colonoscopy, Adult, Care After The following information offers guidance on how to care for yourself after your procedure. Your health care provider may also give you more specific instructions. If you have problems or questions, contact your health care provider. What can I expect after the procedure? After the procedure, it is common to have: A small amount of blood in your stool for 24 hours after the procedure. Some gas. Mild cramping or bloating of your abdomen. Follow these instructions at home: Eating and drinking  Drink enough fluid to keep your urine pale yellow. Follow instructions from your health care provider about eating or drinking restrictions. Resume your normal diet as told by your health care provider. Avoid heavy or fried foods that are hard to digest. Activity Rest as told by your health care provider. Avoid sitting for a long time without moving. Get up  to take short walks every 1-2 hours. This is important to improve blood flow and breathing. Ask for help if you feel weak or unsteady. Return to your normal activities as told by your health care provider. Ask your health care provider what activities are safe for you. Managing cramping and bloating  Try walking around when you have cramps or feel bloated. If directed, apply heat to your abdomen as told by your health care provider. Use the heat source that your health care provider recommends, such as a moist heat pack or a heating pad. Place a towel between your skin and the heat source. Leave the heat on for 20-30 minutes. Remove the heat if your skin turns bright red. This is especially important if you are unable to feel pain, heat, or cold. You have a greater  risk of getting burned. General instructions If you were given a sedative during the procedure, it can affect you for several hours. Do not drive or operate machinery until your health care provider says that it is safe. For the first 24 hours after the procedure: Do not sign important documents. Do not drink alcohol. Do your regular daily activities at a slower pace than normal. Eat soft foods that are easy to digest. Take over-the-counter and prescription medicines only as told by your health care provider. Keep all follow-up visits. This is important. Contact a health care provider if: You have blood in your stool 2-3 days after the procedure. Get help right away if: You have more than a small spotting of blood in your stool. You have large blood clots in your stool. You have swelling of your abdomen. You have nausea or vomiting. You have a fever. You have increasing pain in your abdomen that is not relieved with medicine. These symptoms may be an emergency. Get help right away. Call 911. Do not wait to see if the symptoms will go away. Do not drive yourself to the hospital. Summary After the procedure, it is common to have a small amount of blood in your stool. You may also have mild cramping and bloating of your abdomen. If you were given a sedative during the procedure, it can affect you for several hours. Do not drive or operate machinery until your health care provider says that it is safe. Get help right away if you have a lot of blood in your stool, nausea or vomiting, a fever, or increased pain in your abdomen. This information is not intended to replace advice given to you by your health care provider. Make sure you discuss any questions you have with your health care provider. Document Revised: 09/14/2022 Document Reviewed: 03/25/2021 Elsevier Patient Education  2024 [employer] Monitored Anesthesia Care, Care After The following information offers guidance on how to  care for yourself after your procedure. Your health care provider may also give you more specific instructions. If you have problems or questions, contact your health care provider. What can I expect after the procedure? After the procedure, it is common to have: Tiredness. Little or no memory about what happened during or after the procedure. Impaired judgment when it comes to making decisions. Nausea or vomiting. Some trouble with balance. Follow these instructions at home: For the time period you were told by your health care provider:  Rest. Do not participate in activities where you could fall or become injured. Do not drive or use machinery. Do not drink alcohol. Do not take sleeping pills or medicines that cause drowsiness. Do  not make important decisions or sign legal documents. Do not take care of children on your own. Medicines Take over-the-counter and prescription medicines only as told by your health care provider. If you were prescribed antibiotics, take them as told by your health care provider. Do not stop using the antibiotic even if you start to feel better. Eating and drinking Follow instructions from your health care provider about what you may eat and drink. Drink enough fluid to keep your urine pale yellow. If you vomit: Drink clear fluids slowly and in small amounts as you are able. Clear fluids include water, ice chips, low-calorie sports drinks, and fruit juice that has water added to it (diluted fruit juice). Eat light and bland foods in small amounts as you are able. These foods include bananas, applesauce, rice, lean meats, toast, and crackers. General instructions  Have a responsible adult stay with you for the time you are told. It is important to have someone help care for you until you are awake and alert. If you have sleep apnea, surgery and some medicines can increase your risk for breathing problems. Follow instructions from your health care provider  about wearing your sleep device: When you are sleeping. This includes during daytime naps. While taking prescription pain medicines, sleeping medicines, or medicines that make you drowsy. Do not use any products that contain nicotine or tobacco. These products include cigarettes, chewing tobacco, and vaping devices, such as e-cigarettes. If you need help quitting, ask your health care provider. Contact a health care provider if: You feel nauseous or vomit every time you eat or drink. You feel light-headed. You are still sleepy or having trouble with balance after 24 hours. You get a rash. You have a fever. You have redness or swelling around the IV site. Get help right away if: You have trouble breathing. You have new confusion after you get home. These symptoms may be an emergency. Get help right away. Call 911. Do not wait to see if the symptoms will go away. Do not drive yourself to the hospital. This information is not intended to replace advice given to you by your health care provider. Make sure you discuss any questions you have with your health care provider. Document Revised: 12/28/2021 Document Reviewed: 12/28/2021 Elsevier Patient Education  2024 [employer].

## 2023-06-06 ENCOUNTER — Encounter (HOSPITAL_COMMUNITY): Payer: Self-pay

## 2023-06-06 ENCOUNTER — Encounter (HOSPITAL_COMMUNITY)
Admission: RE | Admit: 2023-06-06 | Discharge: 2023-06-06 | Disposition: A | Discharge status: Home / Self Care | Payer: 59 | Source: Ambulatory Visit | Attending: Internal Medicine | Admitting: Internal Medicine

## 2023-06-06 VITALS — BP 146/93 | HR 86 | Temp 98.3°F | Ht 65.0 in | Wt 224.0 lb

## 2023-06-06 DIAGNOSIS — Z01818 Encounter for other preprocedural examination: Secondary | ICD-10-CM | POA: Insufficient documentation

## 2023-06-06 DIAGNOSIS — D509 Iron deficiency anemia, unspecified: Secondary | ICD-10-CM | POA: Diagnosis not present

## 2023-06-06 HISTORY — DX: Lymphedema, not elsewhere classified: I89.0

## 2023-06-06 LAB — CBC WITH DIFFERENTIAL/PLATELET
Abs Immature Granulocytes: 0 10*3/uL (ref 0.00–0.07)
Basophils Absolute: 0 10*3/uL (ref 0.0–0.1)
Basophils Relative: 1 %
Eosinophils Absolute: 0.1 10*3/uL (ref 0.0–0.5)
Eosinophils Relative: 2 %
HCT: 41.4 % (ref 36.0–46.0)
Hemoglobin: 14.2 g/dL (ref 12.0–15.0)
Immature Granulocytes: 0 %
Lymphocytes Relative: 50 %
Lymphs Abs: 2.8 10*3/uL (ref 0.7–4.0)
MCH: 31.1 pg (ref 26.0–34.0)
MCHC: 34.3 g/dL (ref 30.0–36.0)
MCV: 90.8 fL (ref 80.0–100.0)
Monocytes Absolute: 0.4 10*3/uL (ref 0.1–1.0)
Monocytes Relative: 7 %
Neutro Abs: 2.3 10*3/uL (ref 1.7–7.7)
Neutrophils Relative %: 40 %
Platelets: 292 10*3/uL (ref 150–400)
RBC: 4.56 MIL/uL (ref 3.87–5.11)
RDW: 12.7 % (ref 11.5–15.5)
WBC: 5.6 10*3/uL (ref 4.0–10.5)
nRBC: 0 % (ref 0.0–0.2)

## 2023-06-08 ENCOUNTER — Encounter (HOSPITAL_COMMUNITY)
Admission: RE | Disposition: A | Discharge status: Home / Self Care | Payer: Self-pay | Source: Home / Self Care | Attending: Internal Medicine

## 2023-06-08 ENCOUNTER — Ambulatory Visit (HOSPITAL_COMMUNITY)
Admission: RE | Admit: 2023-06-08 | Discharge: 2023-06-08 | Disposition: A | Discharge status: Home / Self Care | Payer: 59 | Attending: Internal Medicine | Admitting: Internal Medicine

## 2023-06-08 ENCOUNTER — Ambulatory Visit (HOSPITAL_BASED_OUTPATIENT_CLINIC_OR_DEPARTMENT_OTHER): Payer: Self-pay | Admitting: Certified Registered Nurse Anesthetist

## 2023-06-08 ENCOUNTER — Ambulatory Visit (HOSPITAL_COMMUNITY): Payer: 59 | Admitting: Certified Registered Nurse Anesthetist

## 2023-06-08 ENCOUNTER — Encounter (HOSPITAL_COMMUNITY): Payer: Self-pay | Admitting: Internal Medicine

## 2023-06-08 DIAGNOSIS — Z1211 Encounter for screening for malignant neoplasm of colon: Secondary | ICD-10-CM

## 2023-06-08 DIAGNOSIS — K640 First degree hemorrhoids: Secondary | ICD-10-CM | POA: Insufficient documentation

## 2023-06-08 DIAGNOSIS — K5730 Diverticulosis of large intestine without perforation or abscess without bleeding: Secondary | ICD-10-CM | POA: Diagnosis not present

## 2023-06-08 DIAGNOSIS — K219 Gastro-esophageal reflux disease without esophagitis: Secondary | ICD-10-CM | POA: Insufficient documentation

## 2023-06-08 DIAGNOSIS — Z6837 Body mass index (BMI) 37.0-37.9, adult: Secondary | ICD-10-CM | POA: Insufficient documentation

## 2023-06-08 DIAGNOSIS — Z87891 Personal history of nicotine dependence: Secondary | ICD-10-CM | POA: Diagnosis not present

## 2023-06-08 DIAGNOSIS — Z860100 Personal history of colon polyps, unspecified: Secondary | ICD-10-CM | POA: Diagnosis not present

## 2023-06-08 HISTORY — PX: COLONOSCOPY WITH PROPOFOL: SHX5780

## 2023-06-08 SURGERY — COLONOSCOPY WITH PROPOFOL
Anesthesia: General

## 2023-06-08 MED ORDER — PROPOFOL 500 MG/50ML IV EMUL
INTRAVENOUS | Status: AC
  Filled 2023-06-08: qty 50

## 2023-06-08 MED ORDER — LACTATED RINGERS IV SOLN: INTRAVENOUS | Status: DC | PRN

## 2023-06-08 MED ORDER — PROPOFOL 10 MG/ML IV BOLUS
INTRAVENOUS | Status: DC | PRN
  Administered 2023-06-08: 60 mg via INTRAVENOUS

## 2023-06-08 MED ORDER — PROPOFOL 500 MG/50ML IV EMUL
INTRAVENOUS | Status: DC | PRN
  Administered 2023-06-08: 150 ug/kg/min via INTRAVENOUS

## 2023-06-08 MED ORDER — STERILE WATER FOR IRRIGATION IR SOLN
Status: DC | PRN
  Administered 2023-06-08: 60 mL

## 2023-06-08 NOTE — Anesthesia Preprocedure Evaluation (Signed)
Anesthesia Evaluation  Patient identified by MRN, date of birth, ID band Patient awake    Reviewed: Allergy & Precautions, NPO status , Patient's Chart, lab work & pertinent test results  History of Anesthesia Complications (+) history of anesthetic complications  Airway Mallampati: II  TM Distance: >3 FB Neck ROM: Full    Dental no notable dental hx. (+) Dental Advisory Given, Teeth Intact   Pulmonary former smoker   Pulmonary exam normal breath sounds clear to auscultation       Cardiovascular negative cardio ROS Normal cardiovascular exam Rhythm:Regular Rate:Normal     Neuro/Psych negative neurological ROS  negative psych ROS   GI/Hepatic Neg liver ROS,GERD  ,,  Endo/Other    Morbid obesity  Renal/GU negative Renal ROS  negative genitourinary   Musculoskeletal  (+) Arthritis ,    Abdominal  (+) + obese  Peds negative pediatric ROS (+)  Hematology  (+) Blood dyscrasia, anemia   Anesthesia Other Findings Lymphedema  Reproductive/Obstetrics negative OB ROS                             Anesthesia Physical Anesthesia Plan  ASA: 2  Anesthesia Plan: General   Post-op Pain Management: Minimal or no pain anticipated   Induction: Intravenous  PONV Risk Score and Plan: Propofol infusion  Airway Management Planned: Nasal Cannula and Natural Airway  Additional Equipment: None  Intra-op Plan:   Post-operative Plan:   Informed Consent: I have reviewed the patients History and Physical, chart, labs and discussed the procedure including the risks, benefits and alternatives for the proposed anesthesia with the patient or authorized representative who has indicated his/her understanding and acceptance.     Dental advisory given  Plan Discussed with: CRNA  Anesthesia Plan Comments:         Anesthesia Quick Evaluation

## 2023-06-08 NOTE — Anesthesia Postprocedure Evaluation (Signed)
Anesthesia Post Note  Patient: Kathy Dawson  Procedure(s) Performed: COLONOSCOPY WITH PROPOFOL  Patient location during evaluation: PACU Anesthesia Type: General Level of consciousness: awake and alert Pain management: pain level controlled Vital Signs Assessment: post-procedure vital signs reviewed and stable Respiratory status: spontaneous breathing, nonlabored ventilation, respiratory function stable and patient connected to nasal cannula oxygen Cardiovascular status: blood pressure returned to baseline and stable Postop Assessment: no apparent nausea or vomiting Anesthetic complications: no   There were no known notable events for this encounter.   Last Vitals:  Vitals:   06/08/23 1205 06/08/23 1410  BP: 120/74 106/80  Pulse: 67 77  Resp: 13 20  Temp: 36.8 C 36.5 C  SpO2: 96% 96%    Last Pain:  Vitals:   06/08/23 1410  TempSrc: Oral  PainSc: 8                  Ondria Oswald L Maceo Hernan

## 2023-06-08 NOTE — Op Note (Signed)
[facility] Patient Name: Kathy Dawson Procedure Date: 06/08/2023 1:21 PM MRN: [id number] Date of Birth: June 23, 1956 Attending MD: Gennette Pac , MD, [id number] CSN: [id number] Age: 67 Admit Type: Outpatient Procedure:                Colonoscopy Indications:              High risk colon cancer surveillance: Personal                            history of colonic polyps Providers:                Gennette Pac, MD, Sheran Fava,                            Zena Amos Referring MD:              Medicines:                Propofol per Anesthesia Complications:            No immediate complications. Estimated Blood Loss:     Estimated blood loss: none. Estimated blood loss:                            none. Procedure:                Pre-Anesthesia Assessment:                           - Prior to the procedure, a History and Physical                            was performed, and patient medications and                            allergies were reviewed. The patient's tolerance of                            previous anesthesia was also reviewed. The risks                            and benefits of the procedure and the sedation                            options and risks were discussed with the patient.                            All questions were answered, and informed consent                            was obtained. Prior Anticoagulants: The patient has                            taken no anticoagulant or antiplatelet agents. ASA                            Grade Assessment: III -  A patient with severe                            systemic disease. After reviewing the risks and                            benefits, the patient was deemed in satisfactory                            condition to undergo the procedure.                           After obtaining informed consent, the colonoscope                            was passed under direct vision. Throughout the                             procedure, the patient's blood pressure, pulse, and                            oxygen saturations were monitored continuously. The                            [id number]) scope was introduced through the                            anus and advanced to the the cecum, identified by                            appendiceal orifice and ileocecal valve. The                            colonoscopy was performed without difficulty. The                            patient tolerated the procedure well. The quality                            of the bowel preparation was adequate. The                            ileocecal valve, appendiceal orifice, and rectum                            were photographed. The entire colon was well                            visualized. Scope In: 1:54:49 PM Scope Out: 2:06:20 PM Scope Withdrawal Time: 0 hours 7 minutes 29 seconds  Total Procedure Duration: 0 hours 11 minutes 31 seconds  Findings:      The perianal and digital rectal examinations were normal.      Non-bleeding internal hemorrhoids were found during retroflexion. The       hemorrhoids were moderate,  medium-sized and Grade I (internal       hemorrhoids that do not prolapse).      Many medium-mouthed diverticula were found in the sigmoid colon and       descending colon. Patient had (3) 5 mm yellowish pale submucosal       nodules. Positive pillow sign ascending segment. There was a 1.5 cmr       pale yellow submucosal nodule present as well. Soft. Positive pillow       sign.      The exam was otherwise without abnormality on direct and retroflexion       views. Impression:               - Non-bleeding internal hemorrhoids.                           - Diverticulosis in the sigmoid colon and in the                            descending colon.                           - The examination was otherwise normal on direct                            and retroflexion views.                            - No specimens collected. Moderate Sedation:      Moderate (conscious) sedation was personally administered by an       anesthesia professional. The following parameters were monitored: oxygen       saturation, heart rate, blood pressure, respiratory rate, EKG, adequacy       of pulmonary ventilation, and response to care. Recommendation:           - Patient has a contact number available for                            emergencies. The signs and symptoms of potential                            delayed complications were discussed with the                            patient. Return to normal activities tomorrow.                            Written discharge instructions were provided to the                            patient.                           - Advance diet as tolerated.                           - Continue present medications.                           -  Repeat colonoscopy in 7 years for surveillance.                           - Return to GI office (date not yet determined). Procedure Code(s):        --- Professional ---                           [id number], Colonoscopy, flexible; diagnostic, including                            collection of specimen(s) by brushing or washing,                            when performed (separate procedure) Diagnosis Code(s):        --- Professional ---                           Z86.010, Personal history of colonic polyps                           K64.0, First degree hemorrhoids                           K57.30, Diverticulosis of large intestine without                            perforation or abscess without bleeding CPT copyright 2022 American Medical Association. All rights reserved. The codes documented in this report are preliminary and upon coder review may  be revised to meet current compliance requirements. Gerrit Friends. Odalis Jordan, MD Gennette Pac, MD 06/08/2023 2:15:07 PM This report has been signed  electronically. Number of Addenda: 0

## 2023-06-08 NOTE — H&P (Signed)
@LOGO @   Primary Care Physician:  Benita Stabile, MD Primary Gastroenterologist:  Dr.   Pre-Procedure History & Physical: HPI:  Kathy Dawson is a 67 y.o. female here for   Surveillance colonoscopy.    History of colonic polyps.  Past Medical History:  Diagnosis Date   Anemia    Hx   Anemia, iron deficiency 06/09/2013   Arthritis    knees, hips, elbow   Complication of anesthesia 14 yrs. ago   woke up during the appendectomy   GERD (gastroesophageal reflux disease)    otc med prn   Knee pain    Lymphedema    both arms    Past Surgical History:  Procedure Laterality Date   ABDOMINAL HYSTERECTOMY     ANTERIOR HIP REVISION Right 11/18/2014   Procedure: Right Total Hip Arthroplasty Femoral Revision-Anteior approach;  Surgeon: Eldred Manges, MD;  Location: [facility] OR;  Service: Orthopedics;  Laterality: Right;   APPENDECTOMY     bilateral foot surgery     hammer toes   BLADDER SUSPENSION N/A 02/02/2016   Procedure: TRANSVAGINAL TAPE (TVT) Mid-Urethral Sling PROCEDURE;  Surgeon: Shea Evans, MD;  Location: [facility] ORS;  Service: Gynecology;  Laterality: N/A;   COLONOSCOPY  12/08/2011   NFA:[id number] hemorrhoids/Sigmoid diverticulosis. Colonic polyp (tubular adenoma). Surveillance 2018   COLONOSCOPY N/A 09/09/2017   Procedure: COLONOSCOPY;  Surgeon: Corbin Ade, MD;  Location: [facility] ENDO SUITE;  Service: Endoscopy;  Laterality: N/A;  2:15pm   CYSTOSCOPY N/A 02/02/2016   Procedure: CYSTOSCOPY;  Surgeon: Shea Evans, MD;  Location: [facility] ORS;  Service: Gynecology;  Laterality: N/A;   ESOPHAGOGASTRODUODENOSCOPY N/A 09/16/2014   Procedure: ESOPHAGOGASTRODUODENOSCOPY (EGD);  Surgeon: Corbin Ade, MD;  Location: [facility] SU[facility];  Service: Endoscopy;  Laterality: N/A;  1030am   KNEE SURGERY Right 2011   arthroscopy   MALONEY DILATION N/A 09/16/2014   Procedure: Elease Hashimoto DILATION;  Surgeon: Corbin Ade, MD;  Location: [facility];  Service: Endoscopy;  Laterality: N/A;   REPLACEMENT TOTAL KNEE Right  01/19/2013   TOTAL HIP ARTHROPLASTY Right 11/04/2014   Procedure: TOTAL HIP ARTHROPLASTY ANTERIOR APPROACH, Possible Local Bonegrafting Acetabular Cyst;  Surgeon: Eldred Manges, MD;  Location: [facility] OR;  Service: Orthopedics;  Laterality: Right;   TUBAL LIGATION     VAGINAL HYSTERECTOMY N/A 02/02/2016   Procedure: HYSTERECTOMY VAGINAL;  Surgeon: Shea Evans, MD;  Location: [facility] ORS;  Service: Gynecology;  Laterality: N/A;    Prior to Admission medications   Medication Sig Start Date End Date Taking? Authorizing Provider  acetaminophen (TYLENOL) 500 MG tablet Take 1,000 mg by mouth 2 (two) times daily.   Yes [provider]  milk thistle 175 MG tablet Take 175 mg by mouth daily.   Yes [provider]  OVER THE COUNTER MEDICATION Lynch system support 2 per day Biotin daily Magnesium gummies 2 per day 250mg  Vegetable laxative as needed Zinc 50mg  daily CBD gummies 25mg  as needed   Yes [provider]  pantoprazole (PROTONIX) 40 MG tablet Take 40 mg by mouth daily as needed.   Yes [provider]    Allergies as of 05/04/2023 - Review Complete 05/03/2023  Allergen Reaction Noted   Clindamycin Hives and Itching 09/04/2014   Clindamycin/lincomycin Hives and Itching 09/04/2014   Prochlorperazine Other (See Comments) 06/18/2013    Family History  Problem Relation Age of Onset   Colon polyps Father    Cancer Father    Heart disease Father    Heart  disease Other    Colon cancer Neg Hx     Social History   Socioeconomic History   Marital status: Married    Spouse name: Not on file   Number of children: Not on file   Years of education: Not on file   Highest education level: Not on file  Occupational History   Occupation: tobacco farmer  Tobacco Use   Smoking status: Former    Current packs/day: 0.00    Average packs/day: 1 pack/day for 20.0 years (20.0 ttl pk-yrs)    Types: Cigarettes    Start date: 58    Quit date: 77    Years since  quitting: 29.8   Smokeless tobacco: Never  Vaping Use   Vaping status: Never Used  Substance and Sexual Activity   Alcohol use: No    Alcohol/week: 0.0 standard drinks of alcohol   Drug use: No   Sexual activity: Not Currently    Birth control/protection: None  Other Topics Concern   Not on file  Social History Narrative   Not on file   Social Determinants of Health   Financial Resource Strain: Not on file  Food Insecurity: Not on file  Transportation Needs: Not on file  Physical Activity: Not on file  Stress: Not on file  Social Connections: Not on file  Intimate Partner Violence: Not on file    Review of Systems: See HPI, otherwise negative ROS  Physical Exam: BP 120/74   Pulse 67   Temp 98.3 F (36.8 C) (Oral)   Resp 13   Ht 5\' 5"  (1.651 m)   Wt 101.6 kg   SpO2 96%   BMI 37.27 kg/m  General:   Alert,  Well-developed, well-nourished, pleasant and cooperative in NAD SNeck:  Supple; no masses or thyromegaly. No significant cervical adenopathy. Lungs:  Clear throughout to auscultation.   No wheezes, crackles, or rhonchi. No acute distress. Heart:  Regular rate and rhythm; no murmurs, clicks, rubs,  or gallops. Abdomen: Non-distended, normal bowel sounds.  Soft and nontender without appreciable mass or hepatosplenomegaly.  Pulses:  Normal pulses noted. Extremities:  Without clubbing or edema.  Impression/Plan:   67 year old lady here for surveillance colonoscopy.  History of colon polyps. The risks, benefits, limitations, alternatives and imponderables have been reviewed with the patient. Questions have been answered. All parties are agreeable.       Notice: This dictation was prepared with Dragon dictation along with smaller phrase technology. Any transcriptional errors that result from this process are unintentional and may not be corrected upon review.

## 2023-06-08 NOTE — Discharge Instructions (Addendum)
  Colonoscopy Discharge Instructions  Read the instructions outlined below and refer to this sheet in the next few weeks. These discharge instructions provide you with general information on caring for yourself after you leave the hospital. Your doctor may also give you specific instructions. While your treatment has been planned according to the most current medical practices available, unavoidable complications occasionally occur. If you have any problems or questions after discharge, call Dr. Jena Gauss at [phone]. ACTIVITY You may resume your regular activity, but move at a slower pace for the next 24 hours.  Take frequent rest periods for the next 24 hours.  Walking will help get rid of the air and reduce the bloated feeling in your belly (abdomen).  No driving for 24 hours (because of the medicine (anesthesia) used during the test).   Do not sign any important legal documents or operate any machinery for 24 hours (because of the anesthesia used during the test).  NUTRITION Drink plenty of fluids.  You may resume your normal diet as instructed by your doctor.  Begin with a light meal and progress to your normal diet. Heavy or fried foods are harder to digest and may make you feel sick to your stomach (nauseated).  Avoid alcoholic beverages for 24 hours or as instructed.  MEDICATIONS You may resume your normal medications unless your doctor tells you otherwise.  WHAT YOU CAN EXPECT TODAY Some feelings of bloating in the abdomen.  Passage of more gas than usual.  Spotting of blood in your stool or on the toilet paper.  IF YOU HAD POLYPS REMOVED DURING THE COLONOSCOPY: No aspirin products for 7 days or as instructed.  No alcohol for 7 days or as instructed.  Eat a soft diet for the next 24 hours.  FINDING OUT THE RESULTS OF YOUR TEST Not all test results are available during your visit. If your test results are not back during the visit, make an appointment with your caregiver to find out the  results. Do not assume everything is normal if you have not heard from your caregiver or the medical facility. It is important for you to follow up on all of your test results.  SEEK IMMEDIATE MEDICAL ATTENTION IF: You have more than a spotting of blood in your stool.  Your belly is swollen (abdominal distention).  You are nauseated or vomiting.  You have a temperature over 101.  You have abdominal pain or discomfort that is severe or gets worse throughout the day.          Diverticulosis only found today.  No polyps.    Recommend repeat colonoscopy in 7 years   at patient request, I called Ason Sturm at [phone] -  rolled to voicemail.  Left a message.

## 2023-06-08 NOTE — Transfer of Care (Signed)
Immediate Anesthesia Transfer of Care Note  Patient: Kathy Dawson  Procedure(s) Performed: COLONOSCOPY WITH PROPOFOL  Patient Location: Short Stay  Anesthesia Type:General  Level of Consciousness: awake, alert , and oriented  Airway & Oxygen Therapy: Patient Spontanous Breathing  Post-op Assessment: Report given to RN, Post -op Vital signs reviewed and stable, Patient moving all extremities X 4, and Patient able to stick tongue midline  Post vital signs: Reviewed and stable  Last Vitals:  Vitals Value Taken Time  BP 106/80 06/08/23 1410  Temp 36.5 C 06/08/23 1410  Pulse 77 06/08/23 1410  Resp 20 06/08/23 1410  SpO2 96 % 06/08/23 1410    Last Pain:  Vitals:   06/08/23 1410  TempSrc: Oral  PainSc: 8       Patients Stated Pain Goal: 4 (06/08/23 1205)  Complications: No notable events documented.

## 2023-06-15 ENCOUNTER — Encounter (HOSPITAL_COMMUNITY): Payer: Self-pay | Admitting: Internal Medicine

## 2023-06-23 DIAGNOSIS — E785 Hyperlipidemia, unspecified: Secondary | ICD-10-CM | POA: Diagnosis not present
